# Patient Record
Sex: Female | Born: 1935 | Race: White | Hispanic: No | Smoking: Former smoker
Health system: Southern US, Community
[De-identification: ages and names within clinical notes are randomized; demographics above are authoritative.]

## PROBLEM LIST (undated history)

## (undated) DIAGNOSIS — T7840XA Allergy, unspecified, initial encounter: Secondary | ICD-10-CM

## (undated) DIAGNOSIS — K219 Gastro-esophageal reflux disease without esophagitis: Secondary | ICD-10-CM

## (undated) DIAGNOSIS — C801 Malignant (primary) neoplasm, unspecified: Secondary | ICD-10-CM

## (undated) DIAGNOSIS — E7800 Pure hypercholesterolemia, unspecified: Secondary | ICD-10-CM

## (undated) DIAGNOSIS — G43909 Migraine, unspecified, not intractable, without status migrainosus: Secondary | ICD-10-CM

## (undated) DIAGNOSIS — M549 Dorsalgia, unspecified: Secondary | ICD-10-CM

## (undated) DIAGNOSIS — J449 Chronic obstructive pulmonary disease, unspecified: Secondary | ICD-10-CM

## (undated) DIAGNOSIS — R634 Abnormal weight loss: Secondary | ICD-10-CM

## (undated) DIAGNOSIS — I10 Essential (primary) hypertension: Secondary | ICD-10-CM

## (undated) DIAGNOSIS — Z923 Personal history of irradiation: Secondary | ICD-10-CM

## (undated) DIAGNOSIS — K611 Rectal abscess: Secondary | ICD-10-CM

## (undated) HISTORY — PX: CYST REMOVAL HAND: SHX6279

## (undated) HISTORY — PX: ABDOMINAL HYSTERECTOMY: SHX81

---

## 2000-11-08 ENCOUNTER — Encounter: Payer: Self-pay | Admitting: Internal Medicine

## 2000-11-08 ENCOUNTER — Encounter: Admission: RE | Admit: 2000-11-08 | Discharge: 2000-11-08 | Payer: Self-pay | Admitting: Internal Medicine

## 2001-01-23 ENCOUNTER — Encounter (INDEPENDENT_AMBULATORY_CARE_PROVIDER_SITE_OTHER): Payer: Self-pay

## 2001-01-23 ENCOUNTER — Ambulatory Visit (HOSPITAL_COMMUNITY): Admission: RE | Admit: 2001-01-23 | Discharge: 2001-01-23 | Payer: Self-pay | Admitting: Gastroenterology

## 2001-01-23 HISTORY — PX: COLONOSCOPY W/ POLYPECTOMY: SHX1380

## 2001-02-18 ENCOUNTER — Encounter: Payer: Self-pay | Admitting: Gastroenterology

## 2001-02-18 ENCOUNTER — Ambulatory Visit (HOSPITAL_COMMUNITY): Admission: RE | Admit: 2001-02-18 | Discharge: 2001-02-18 | Payer: Self-pay | Admitting: Gastroenterology

## 2002-05-12 ENCOUNTER — Encounter: Admission: RE | Admit: 2002-05-12 | Discharge: 2002-05-12 | Payer: Self-pay | Admitting: Internal Medicine

## 2002-05-12 ENCOUNTER — Encounter: Payer: Self-pay | Admitting: Internal Medicine

## 2003-06-10 ENCOUNTER — Encounter: Admission: RE | Admit: 2003-06-10 | Discharge: 2003-06-10 | Payer: Self-pay | Admitting: Internal Medicine

## 2004-07-11 ENCOUNTER — Encounter: Admission: RE | Admit: 2004-07-11 | Discharge: 2004-07-11 | Payer: Self-pay | Admitting: Internal Medicine

## 2004-09-06 ENCOUNTER — Ambulatory Visit: Payer: Self-pay | Admitting: Internal Medicine

## 2005-10-19 ENCOUNTER — Emergency Department (HOSPITAL_COMMUNITY): Admission: EM | Admit: 2005-10-19 | Discharge: 2005-10-20 | Payer: Self-pay | Admitting: Emergency Medicine

## 2008-05-03 ENCOUNTER — Ambulatory Visit (HOSPITAL_COMMUNITY): Admission: RE | Admit: 2008-05-03 | Discharge: 2008-05-03 | Payer: Self-pay | Admitting: Pulmonary Disease

## 2010-08-04 NOTE — Procedures (Signed)
[place]  Patient:    Sara Cochran, Sara Cochran Visit Number: [id number] MRN: [id number]          Service Type: END Location: ENDO Attending Physician:  Charna Elizabeth Dictated by:   Anselmo Rod, M.D. Proc. Date: 01/23/01 Admit Date:  01/23/2001   CC:         Juluis Mire, M.D.   Procedure Report  DATE OF BIRTH:  July 13, 2033  PROCEDURE PERFORMED:  Colonoscopy with hot biopsies x 4.  ENDOSCOPIST:  Anselmo Rod, M.D.  INSTRUMENT USED:  Olympus video colonoscope (pediatric).  INDICATIONS FOR PROCEDURE:  Screening colonoscopy being performed on this 77 year old white female, rule out colonic polyps, masses, hemorrhoids, etc.  PREPROCEDURE PREPARATION:  Informed consent was obtained from the patient. The patient was fasted for 8 hours prior to the procedure, and prepped with a bottle of magnesium citrate and a gallon of NULYTELY the night prior to the procedure.  PREPROCEDURE PHYSICAL EXAMINATION:  VITAL SIGNS:  Stable.  NECK:  Supple.  CHEST:  Clear to auscultation.  S1 and S2 regular.  ABDOMEN:  Soft, normal bowel sounds.  DESCRIPTION OF PROCEDURE:  The patient was placed in the left lateral decubitus position, and sedated with 100 mg of Demerol and 10 mg of Versed intravenously.  Once the patient was adequately sedated and maintained on low flow oxygen and continuous cardiac monitoring, the Olympus video colonoscope was advanced from the rectum to the hepatic flexure with extreme difficulty, as the patient had a very tortuous colon.  The colon seemed to be long and redundant.  There were multiple small polyps removed from the rectosigmoid area (4).  There was a few early scattered diverticuli throughout the colon. No large masses or polyps were seen.  IMPRESSION: 1. A limited colonoscopy up to the hepatic flexure, scope would not be    advanced into the right colon. 2. Multiple small polyps biopsied from rectosigmoid area and  rectum. 3. A few early scattered diverticuli. 4. Prominent internal hemorrhoids on retroflexion.  RECOMMENDATIONS: 1. A contrast barium enema will be done in the next 3 weeks. 2. Await pathology results. 3. Outpatient followup in the next month. Dictated by:   Anselmo Rod, M.D. Attending Physician:  Charna Elizabeth DD:  01/23/01 TD:  01/24/01 Job: [id number]

## 2011-06-05 DIAGNOSIS — E785 Hyperlipidemia, unspecified: Secondary | ICD-10-CM | POA: Diagnosis not present

## 2011-06-05 DIAGNOSIS — I10 Essential (primary) hypertension: Secondary | ICD-10-CM | POA: Diagnosis not present

## 2011-06-05 DIAGNOSIS — M545 Low back pain: Secondary | ICD-10-CM | POA: Diagnosis not present

## 2011-06-05 DIAGNOSIS — J449 Chronic obstructive pulmonary disease, unspecified: Secondary | ICD-10-CM | POA: Diagnosis not present

## 2011-09-18 DIAGNOSIS — M545 Low back pain: Secondary | ICD-10-CM | POA: Diagnosis not present

## 2011-09-18 DIAGNOSIS — F411 Generalized anxiety disorder: Secondary | ICD-10-CM | POA: Diagnosis not present

## 2011-09-18 DIAGNOSIS — J449 Chronic obstructive pulmonary disease, unspecified: Secondary | ICD-10-CM | POA: Diagnosis not present

## 2011-09-18 DIAGNOSIS — I10 Essential (primary) hypertension: Secondary | ICD-10-CM | POA: Diagnosis not present

## 2011-10-15 ENCOUNTER — Emergency Department (INDEPENDENT_AMBULATORY_CARE_PROVIDER_SITE_OTHER)
Admission: EM | Admit: 2011-10-15 | Discharge: 2011-10-15 | Disposition: A | Discharge status: Home / Self Care | Payer: Medicare Other | Source: Home / Self Care

## 2011-10-15 ENCOUNTER — Encounter (HOSPITAL_COMMUNITY): Payer: Self-pay | Admitting: *Deleted

## 2011-10-15 DIAGNOSIS — R300 Dysuria: Secondary | ICD-10-CM | POA: Diagnosis not present

## 2011-10-15 DIAGNOSIS — N3000 Acute cystitis without hematuria: Secondary | ICD-10-CM

## 2011-10-15 DIAGNOSIS — R350 Frequency of micturition: Secondary | ICD-10-CM | POA: Diagnosis not present

## 2011-10-15 HISTORY — DX: Pure hypercholesterolemia, unspecified: E78.00

## 2011-10-15 HISTORY — DX: Chronic obstructive pulmonary disease, unspecified: J44.9

## 2011-10-15 HISTORY — DX: Migraine, unspecified, not intractable, without status migrainosus: G43.909

## 2011-10-15 HISTORY — DX: Dorsalgia, unspecified: M54.9

## 2011-10-15 HISTORY — DX: Gastro-esophageal reflux disease without esophagitis: K21.9

## 2011-10-15 LAB — POCT URINALYSIS DIP (DEVICE)
Glucose, UA: 100 mg/dL — AB
Ketones, ur: NEGATIVE mg/dL
Specific Gravity, Urine: 1.01 (ref 1.005–1.030)

## 2011-10-15 MED ORDER — CIPROFLOXACIN HCL 500 MG PO TABS: 250.0000 mg | ORAL_TABLET | Freq: Two times a day (BID) | ORAL | Status: AC

## 2011-10-15 NOTE — ED Notes (Signed)
Pt with onset of urinary frequency/urgency and burning Saturday taking otc azo -

## 2011-10-15 NOTE — ED Provider Notes (Signed)
History     CSN: [id number]  Arrival date & time 10/15/11  1107   None     Chief Complaint  Patient presents with  . Urinary Frequency  . Dysuria    (Consider location/radiation/quality/duration/timing/severity/associated sxs/prior treatment) The history is provided by the patient.    Sara Cochran is a 78 y.o. female who complains of urinary frequency, urgency and dysuria x 2 days, with flank pain, fever, chills, or abnormal vaginal discharge or bleeding. Has been taking AZO for symptoms with no relief.  Has history of UTI, not frequent, last UTI 2 years ago.  Denies known kidney disease or structural abnormalities.  Past Medical History  Diagnosis Date  . COPD (chronic obstructive pulmonary disease)   . High cholesterol   . Acid reflux   . Back pain   . Migraine     Past Surgical History  Procedure Date  . Cesarean section   . Abdominal hysterectomy     No family history on file.  History  Substance Use Topics  . Smoking status: Former Smoker    Quit date: 05/16/1996  . Smokeless tobacco: Not on file  . Alcohol Use: No    OB History    Grav Para Term Preterm Abortions TAB SAB Ect Mult Living                  Review of Systems  Constitutional: Negative.   Gastrointestinal: Negative.   Genitourinary: Positive for dysuria, urgency, frequency and flank pain. Negative for hematuria, decreased urine volume, vaginal bleeding, vaginal discharge, difficulty urinating and pelvic pain.    Allergies  Aspirin and Erythromycin  Home Medications   Current Outpatient Rx  Name Route Sig Dispense Refill  . ALBUTEROL SULFATE HFA 108 (90 BASE) MCG/ACT IN AERS Inhalation Inhale 2 puffs into the lungs every 6 (six) hours as needed.    . ALPRAZOLAM 1 MG PO TABS Oral Take 1 mg by mouth at bedtime as needed.    Marland Kitchen HYDROCODONE-ACETAMINOPHEN 10-650 MG PO TABS Oral Take 1 tablet by mouth every 6 (six) hours as needed.    . IPRATROPIUM-ALBUTEROL 0.5-2.5 (3) MG/3ML IN SOLN  Nebulization Take 3 mLs by nebulization.    Marland Kitchen OMEPRAZOLE 20 MG PO CPDR Oral Take 20 mg by mouth daily.    . AZO TABS PO Oral Take by mouth.    Marland Kitchen PRAVASTATIN SODIUM 40 MG PO TABS Oral Take 40 mg by mouth daily.    Marland Kitchen CIPROFLOXACIN HCL 500 MG PO TABS Oral Take 0.5 tablets (250 mg total) by mouth every 12 (twelve) hours. 10 tablet 0    BP 184/75  Pulse 98  Temp 98.1 F (36.7 C) (Oral)  Resp 20  SpO2 92%  Physical Exam  Nursing note and vitals reviewed. Constitutional: She is oriented to person, place, and time. Vital signs are normal. She appears well-developed and well-nourished. She is active and cooperative.  HENT:  Head: Normocephalic.  Eyes: Conjunctivae are normal. Pupils are equal, round, and reactive to light. No scleral icterus.  Neck: Trachea normal. Neck supple.  Cardiovascular: Normal rate, regular rhythm and normal heart sounds.   Pulmonary/Chest: Effort normal and breath sounds normal.  Abdominal: Soft. Normal appearance and bowel sounds are normal. There is no hepatosplenomegaly. There is no tenderness. There is no CVA tenderness.  Lymphadenopathy:       Right: No inguinal adenopathy present.       Left: No inguinal adenopathy present.  Neurological: She is alert and oriented to  person, place, and time. No cranial nerve deficit or sensory deficit. GCS eye subscore is 4. GCS verbal subscore is 5. GCS motor subscore is 6.  Skin: Skin is warm and dry.  Psychiatric: She has a normal mood and affect. Her speech is normal and behavior is normal. Judgment and thought content normal. Cognition and memory are normal.    ED Course  Procedures (including critical care time)  Labs Reviewed  POCT URINALYSIS DIP (DEVICE) - Abnormal; Notable for the following:    Glucose, UA 100 (*)     Bilirubin Urine SMALL (*)     Hgb urine dipstick MODERATE (*)     Protein, ur 30 (*)     Nitrite POSITIVE (*)     Leukocytes, UA TRACE (*)  Biochemical Testing Only. Please order routine  urinalysis from main lab if confirmatory testing is needed.   All other components within normal limits  URINE CULTURE   No results found.   1. Acute cystitis   2. Urinary frequency   3. Dysuria       MDM  Increase fluid intake, take antibiotics as prescribed, await results of urine culture, follow up with primary care provider if symptoms do not improve.        Johnsie Kindred, NP 10/15/11 1215

## 2011-10-16 NOTE — ED Provider Notes (Signed)
Medical screening examination/treatment/procedure(s) were performed by non-physician practitioner and as supervising physician I was immediately available for consultation/collaboration.   [facility]; MD   Sharin Grave, MD 10/16/11 [phone]

## 2011-10-17 LAB — URINE CULTURE

## 2011-10-18 NOTE — ED Notes (Signed)
Urine culture: 15,000 colonies E. Coli.  Pt. adequately treated with Cipro. Vassie Moselle 10/18/2011

## 2011-10-22 DIAGNOSIS — I10 Essential (primary) hypertension: Secondary | ICD-10-CM | POA: Diagnosis not present

## 2011-10-29 DIAGNOSIS — I10 Essential (primary) hypertension: Secondary | ICD-10-CM | POA: Diagnosis not present

## 2011-11-12 DIAGNOSIS — I10 Essential (primary) hypertension: Secondary | ICD-10-CM | POA: Diagnosis not present

## 2011-11-26 DIAGNOSIS — I10 Essential (primary) hypertension: Secondary | ICD-10-CM | POA: Diagnosis not present

## 2011-12-13 DIAGNOSIS — Z23 Encounter for immunization: Secondary | ICD-10-CM | POA: Diagnosis not present

## 2011-12-17 DIAGNOSIS — I10 Essential (primary) hypertension: Secondary | ICD-10-CM | POA: Diagnosis not present

## 2011-12-17 DIAGNOSIS — M545 Low back pain: Secondary | ICD-10-CM | POA: Diagnosis not present

## 2011-12-20 DIAGNOSIS — E785 Hyperlipidemia, unspecified: Secondary | ICD-10-CM | POA: Diagnosis not present

## 2011-12-20 DIAGNOSIS — I10 Essential (primary) hypertension: Secondary | ICD-10-CM | POA: Diagnosis not present

## 2011-12-20 DIAGNOSIS — J449 Chronic obstructive pulmonary disease, unspecified: Secondary | ICD-10-CM | POA: Diagnosis not present

## 2011-12-20 DIAGNOSIS — Z23 Encounter for immunization: Secondary | ICD-10-CM | POA: Diagnosis not present

## 2011-12-20 DIAGNOSIS — M545 Low back pain: Secondary | ICD-10-CM | POA: Diagnosis not present

## 2012-01-28 DIAGNOSIS — J449 Chronic obstructive pulmonary disease, unspecified: Secondary | ICD-10-CM | POA: Diagnosis not present

## 2012-01-28 DIAGNOSIS — J440 Chronic obstructive pulmonary disease with acute lower respiratory infection: Secondary | ICD-10-CM | POA: Diagnosis not present

## 2012-03-26 DIAGNOSIS — M545 Low back pain: Secondary | ICD-10-CM | POA: Diagnosis not present

## 2012-03-26 DIAGNOSIS — E785 Hyperlipidemia, unspecified: Secondary | ICD-10-CM | POA: Diagnosis not present

## 2012-03-26 DIAGNOSIS — I10 Essential (primary) hypertension: Secondary | ICD-10-CM | POA: Diagnosis not present

## 2012-03-26 DIAGNOSIS — J449 Chronic obstructive pulmonary disease, unspecified: Secondary | ICD-10-CM | POA: Diagnosis not present

## 2012-05-07 DIAGNOSIS — I10 Essential (primary) hypertension: Secondary | ICD-10-CM | POA: Diagnosis not present

## 2012-05-07 DIAGNOSIS — E785 Hyperlipidemia, unspecified: Secondary | ICD-10-CM | POA: Diagnosis not present

## 2012-05-07 DIAGNOSIS — N390 Urinary tract infection, site not specified: Secondary | ICD-10-CM | POA: Diagnosis not present

## 2012-07-03 DIAGNOSIS — J449 Chronic obstructive pulmonary disease, unspecified: Secondary | ICD-10-CM | POA: Diagnosis not present

## 2012-07-03 DIAGNOSIS — N390 Urinary tract infection, site not specified: Secondary | ICD-10-CM | POA: Diagnosis not present

## 2012-07-03 DIAGNOSIS — I10 Essential (primary) hypertension: Secondary | ICD-10-CM | POA: Diagnosis not present

## 2012-07-03 DIAGNOSIS — M545 Low back pain: Secondary | ICD-10-CM | POA: Diagnosis not present

## 2012-08-14 DIAGNOSIS — I10 Essential (primary) hypertension: Secondary | ICD-10-CM | POA: Diagnosis not present

## 2012-08-14 DIAGNOSIS — E785 Hyperlipidemia, unspecified: Secondary | ICD-10-CM | POA: Diagnosis not present

## 2012-08-14 DIAGNOSIS — M545 Low back pain: Secondary | ICD-10-CM | POA: Diagnosis not present

## 2012-08-25 DIAGNOSIS — L570 Actinic keratosis: Secondary | ICD-10-CM | POA: Diagnosis not present

## 2012-08-25 DIAGNOSIS — D0460 Carcinoma in situ of skin of unspecified upper limb, including shoulder: Secondary | ICD-10-CM | POA: Diagnosis not present

## 2012-08-25 DIAGNOSIS — D235 Other benign neoplasm of skin of trunk: Secondary | ICD-10-CM | POA: Diagnosis not present

## 2012-10-23 DIAGNOSIS — K219 Gastro-esophageal reflux disease without esophagitis: Secondary | ICD-10-CM | POA: Diagnosis not present

## 2012-10-23 DIAGNOSIS — R141 Gas pain: Secondary | ICD-10-CM | POA: Diagnosis not present

## 2012-10-23 DIAGNOSIS — K589 Irritable bowel syndrome without diarrhea: Secondary | ICD-10-CM | POA: Diagnosis not present

## 2012-10-23 DIAGNOSIS — K602 Anal fissure, unspecified: Secondary | ICD-10-CM | POA: Diagnosis not present

## 2012-10-23 DIAGNOSIS — R634 Abnormal weight loss: Secondary | ICD-10-CM | POA: Diagnosis not present

## 2012-10-23 DIAGNOSIS — R142 Eructation: Secondary | ICD-10-CM | POA: Diagnosis not present

## 2012-11-11 DIAGNOSIS — K589 Irritable bowel syndrome without diarrhea: Secondary | ICD-10-CM | POA: Diagnosis not present

## 2012-11-11 DIAGNOSIS — K625 Hemorrhage of anus and rectum: Secondary | ICD-10-CM | POA: Diagnosis not present

## 2012-11-11 DIAGNOSIS — K602 Anal fissure, unspecified: Secondary | ICD-10-CM | POA: Diagnosis not present

## 2012-11-11 DIAGNOSIS — K6289 Other specified diseases of anus and rectum: Secondary | ICD-10-CM | POA: Diagnosis not present

## 2012-11-13 DIAGNOSIS — Z23 Encounter for immunization: Secondary | ICD-10-CM | POA: Diagnosis not present

## 2012-11-13 DIAGNOSIS — M545 Low back pain: Secondary | ICD-10-CM | POA: Diagnosis not present

## 2012-11-13 DIAGNOSIS — J449 Chronic obstructive pulmonary disease, unspecified: Secondary | ICD-10-CM | POA: Diagnosis not present

## 2012-11-13 DIAGNOSIS — I10 Essential (primary) hypertension: Secondary | ICD-10-CM | POA: Diagnosis not present

## 2012-11-13 DIAGNOSIS — F411 Generalized anxiety disorder: Secondary | ICD-10-CM | POA: Diagnosis not present

## 2012-12-18 DIAGNOSIS — R141 Gas pain: Secondary | ICD-10-CM | POA: Diagnosis not present

## 2012-12-18 DIAGNOSIS — K648 Other hemorrhoids: Secondary | ICD-10-CM | POA: Diagnosis not present

## 2012-12-18 DIAGNOSIS — K589 Irritable bowel syndrome without diarrhea: Secondary | ICD-10-CM | POA: Diagnosis not present

## 2012-12-18 DIAGNOSIS — K5730 Diverticulosis of large intestine without perforation or abscess without bleeding: Secondary | ICD-10-CM | POA: Diagnosis not present

## 2013-01-13 DIAGNOSIS — E785 Hyperlipidemia, unspecified: Secondary | ICD-10-CM | POA: Diagnosis not present

## 2013-01-13 DIAGNOSIS — J449 Chronic obstructive pulmonary disease, unspecified: Secondary | ICD-10-CM | POA: Diagnosis not present

## 2013-01-13 DIAGNOSIS — I10 Essential (primary) hypertension: Secondary | ICD-10-CM | POA: Diagnosis not present

## 2013-01-13 DIAGNOSIS — K589 Irritable bowel syndrome without diarrhea: Secondary | ICD-10-CM | POA: Diagnosis not present

## 2013-01-21 ENCOUNTER — Other Ambulatory Visit: Payer: Self-pay | Admitting: Gastroenterology

## 2013-01-21 DIAGNOSIS — R198 Other specified symptoms and signs involving the digestive system and abdomen: Secondary | ICD-10-CM | POA: Diagnosis not present

## 2013-01-21 DIAGNOSIS — K6289 Other specified diseases of anus and rectum: Secondary | ICD-10-CM

## 2013-01-21 DIAGNOSIS — Z1211 Encounter for screening for malignant neoplasm of colon: Secondary | ICD-10-CM | POA: Diagnosis not present

## 2013-01-21 DIAGNOSIS — C20 Malignant neoplasm of rectum: Secondary | ICD-10-CM | POA: Diagnosis not present

## 2013-01-21 DIAGNOSIS — K625 Hemorrhage of anus and rectum: Secondary | ICD-10-CM | POA: Diagnosis not present

## 2013-01-21 HISTORY — PX: RECTAL BIOPSY: SHX2303

## 2013-01-22 ENCOUNTER — Ambulatory Visit
Admission: RE | Admit: 2013-01-22 | Discharge: 2013-01-22 | Disposition: A | Discharge status: Home / Self Care | Payer: Medicare Other | Source: Ambulatory Visit | Attending: Gastroenterology | Admitting: Gastroenterology

## 2013-01-22 DIAGNOSIS — R198 Other specified symptoms and signs involving the digestive system and abdomen: Secondary | ICD-10-CM | POA: Diagnosis not present

## 2013-01-22 DIAGNOSIS — K625 Hemorrhage of anus and rectum: Secondary | ICD-10-CM | POA: Diagnosis not present

## 2013-01-22 DIAGNOSIS — K6289 Other specified diseases of anus and rectum: Secondary | ICD-10-CM

## 2013-01-22 DIAGNOSIS — C20 Malignant neoplasm of rectum: Secondary | ICD-10-CM | POA: Diagnosis not present

## 2013-01-22 MED ORDER — IOHEXOL 300 MG/ML  SOLN
30.0000 mL | Freq: Once | INTRAMUSCULAR | Status: AC | PRN
  Administered 2013-01-22: 30 mL via ORAL

## 2013-01-22 MED ORDER — IOHEXOL 300 MG/ML  SOLN
100.0000 mL | Freq: Once | INTRAMUSCULAR | Status: AC | PRN
  Administered 2013-01-22: 100 mL via INTRAVENOUS

## 2013-01-22 MED ORDER — IOHEXOL 300 MG/ML  SOLN: 30.0000 mL | Freq: Once | INTRAMUSCULAR | Status: DC | PRN

## 2013-01-26 ENCOUNTER — Encounter (INDEPENDENT_AMBULATORY_CARE_PROVIDER_SITE_OTHER): Payer: Self-pay | Admitting: General Surgery

## 2013-01-26 ENCOUNTER — Ambulatory Visit (INDEPENDENT_AMBULATORY_CARE_PROVIDER_SITE_OTHER): Payer: Medicare Other | Admitting: General Surgery

## 2013-01-26 VITALS — BP 160/82 | HR 80 | Temp 99.2°F | Resp 15 | Ht 62.0 in | Wt 106.8 lb

## 2013-01-26 DIAGNOSIS — C210 Malignant neoplasm of anus, unspecified: Secondary | ICD-10-CM

## 2013-01-26 NOTE — Progress Notes (Signed)
Chief Complaint  Patient presents with  . New Evaluation    eval rectal mass with perirectal absces    HISTORY: Sara Cochran is a 79 y.o. female who presents to the office with distal rectal mass.  This was found on colonoscopy.  Workup thus far has included an attempted colonoscopy but this was unable to be performed due to an obstructing distal rectal mass.  The patient states she has been having anal pain and pressure symptoms for about a year and has been treated for a fissure.  She has also had diarrhea and a 25 lb weight loss.  This was mainly attributed to the loss of her husband about a year ago.  She does not have any problems moving her bowels and the pressure sensation got better after the colonoscopy.  She reports some intermittent purulent drainage over the past few days as well.  She denies any fevers.     Past Medical History  Diagnosis Date  . COPD (chronic obstructive pulmonary disease)   . High cholesterol   . Acid reflux   . Back pain   . Migraine       Past Surgical History  Procedure Laterality Date  . Cesarean section    . Abdominal hysterectomy    . Cyst removal hand      3 removed from right hand      Current Outpatient Prescriptions  Medication Sig Dispense Refill  . albuterol (PROVENTIL HFA;VENTOLIN HFA) 108 (90 BASE) MCG/ACT inhaler Inhale 2 puffs into the lungs every 6 (six) hours as needed.      . ALPRAZolam (XANAX) 1 MG tablet Take 1 mg by mouth at bedtime as needed.      Marland Kitchen amLODipine (NORVASC) 5 MG tablet       . estradiol (ESTRACE) 2 MG tablet       . HYDROcodone-acetaminophen (LORCET) 10-650 MG per tablet Take 1 tablet by mouth every 6 (six) hours as needed.      Marland Kitchen ipratropium-albuterol (DUONEB) 0.5-2.5 (3) MG/3ML SOLN Take 3 mLs by nebulization.      . lidocaine (XYLOCAINE) 5 % ointment Apply 1 application topically as needed.      Marland Kitchen olmesartan (BENICAR) 40 MG tablet Take 40 mg by mouth daily.      Marland Kitchen omeprazole (PRILOSEC) 20 MG capsule Take 20 mg  by mouth daily.      . Phenazopyridine HCl (AZO TABS PO) Take by mouth.      . pravastatin (PRAVACHOL) 40 MG tablet Take 40 mg by mouth daily.       No current facility-administered medications for this visit.      Allergies  Allergen Reactions  . Aspirin Shortness Of Breath and Swelling  . Erythromycin Hives and Rash  . Niacin And Related Other (See Comments)    Redness all over       Family History  Problem Relation Age of Onset  . Heart disease Mother   . Stroke Mother   . Cancer Sister     brain      History   Social History  . Marital Status: Widowed    Spouse Name: N/A    Number of Children: N/A  . Years of Education: N/A   Social History Main Topics  . Smoking status: Former Smoker    Quit date: 05/16/1996  . Smokeless tobacco: Never Used  . Alcohol Use: No  . Drug Use: No  . Sexual Activity: None   Other Topics Concern  .  None   Social History Narrative  . None       REVIEW OF SYSTEMS - PERTINENT POSITIVES ONLY: Review of Systems - General ROS: positive for  - weight loss negative for - chills or fever Respiratory ROS: no cough, shortness of breath, or wheezing Cardiovascular ROS: no chest pain or dyspnea on exertion Gastrointestinal ROS: positive for - change in stools and diarrhea negative for - change in bowel habits or nausea/vomiting Genito-Urinary ROS: no dysuria, trouble voiding, or hematuria  EXAM: Filed Vitals:   01/26/13 1348  BP: 160/82  Pulse: 80  Temp: 99.2 F (37.3 C)  Resp: 15    Gen:  No acute distress.  Well nourished and well groomed.   Neurological: Alert and oriented to person, place, and time. Coordination normal.  Head: Normocephalic and atraumatic.  Eyes: Conjunctivae are normal. Pupils are equal, round, and reactive to light. No scleral icterus.  Neck: Normal range of motion. Neck supple. No tracheal deviation or thyromegaly present.  No cervical lymphadenopathy. Cardiovascular: Normal rate, regular rhythm, normal  heart sounds and intact distal pulses.  Exam reveals no gallop and no friction rub.  No murmur heard. Respiratory: Effort normal.  No respiratory distress. No chest wall tenderness. Breath sounds normal.  No wheezes, rales or rhonchi.  GI: Soft. Bowel sounds are normal. The abdomen is soft and nontender.  There is no rebound and no guarding.  Musculoskeletal: Normal range of motion. Extremities are nontender.  Skin: Skin is warm and dry. No rash noted. No diaphoresis. No erythema. No pallor. No clubbing, cyanosis, or edema.   Psychiatric: Normal mood and affect. Behavior is normal. Judgment and thought content normal.   Anorectal Exam: distal rectal mass palpated along right sidewall, ~4cm from anal verge   LABORATORY RESULTS: Available labs are reviewed    RADIOLOGY RESULTS:   Images and reports are reviewed. CT ABD IMPRESSION: Rectal wall thickening, possibly reflecting proctitis. Associated 3.2 x 2.6 cm left perirectal gas and fluid collection.  Although not visualized, fistula communication between the collection and the rectal lumen is suspected. Three tiny hypoenhancing liver lesions which likely reflects cysts.   ASSESSMENT AND PLAN:  Sara Cochran is a 79 y.o. 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  Her CT shows a perirectal abscess, but it sounds like this is draining, given her description of occasional purulent drainage.  I will set her up for a CT chest to complete her metastatic work up.  Given her CT findings, I think she would benefit from a PET to evaluate for distant disease.  I will refer her to oncology and radiation oncology for treatment.  We discussed the nature of this disease and all questions were answered.  She may need operative drainage of her abscess in the future, if her symptoms return.     Vanita Panda, MD Colon and Rectal Surgery / General Surgery [facility]      Visit Diagnoses: 1. Anal  cancer     Primary Care Physician: Fredirick Maudlin, MD

## 2013-01-26 NOTE — Patient Instructions (Signed)
Anal Cancer  What is anal cancer? Cancer describes a set of diseases in which normal cells in the body, through a series of genetic changes, become abnormal and lose the ability to control their growth. As cancers - also known as "malignancies" - grow, they invade the tissues around them (local invasion). They may also spread to other locations in the body via the blood vessels or lymphatic channels where they may implant and grow (metastases). The anus or anal canal is the passage that connects the rectum, or last part of the large intestine, to the outside of the body. Anal cancer arises from the cells around the anal opening or in the anal canal just inside the anal opening. Anal cancer is often a type of cancer called "squamous cell carcinoma". Other rare types of cancer may also occur in the anal canal and these require consultation with your physician or surgeon to determine the appropriate evaluation and treatment.  Cells that are becoming malignant or "premalignant", but have not invaded deeper into the skin, are referred to as "high-grade anal intraepithelial neoplasia" or HGAIN (previously referred to by a number of different terms, including "high grade dysplasia", "carcinoma-in-situ", "anal intra-epithelial neoplasia grade III", "high-grade squamous intraepithelial lesion", or "Bowen's disease"). While this condition is likely a precursor to anal cancer, this is not anal cancer and is treated differently than anal cancer. Your physician or colon and rectal surgeon can help clarify the differences. How common is anal cancer? Anal cancer is fairly uncommon, and accounts for about 1-2% of cancers affecting the intestinal tract. Approximately one in 600 men and women will get anal cancer in their lifetime (this can be compared to 1 in 20 men and women who will developed colon and rectal cancer in their lifetime). Almost 6,000 new cases of anal cancer are now diagnosed each year in the [place] with about  2/3rds of the cases in women. Approximately 800 people will die of the disease each year. Unlike some cancers, the numbers of patients that develop anal cancer each year is slowly increasing, especially in some higher risk groups (see below).  Who is at risk? A risk factor is something that increases a person's chance of getting a disease. Anal cancer is commonly associated with infection with the human papilloma virus (HPV), but some anal cancers develop without this infection being present. HPV can also cause warts in and around the anus as well as genital warts (on the penis in men and the vagina or cervix in women), but warts do not have to be present for anal cancer to develop. HPV is also associated with an increased risk of cervical and vaginal or vulvar cancer in women, penile cancer in men, as well as with some head and neck cancers in men and women. Having some of these cancers, especially cervical or vulvar cancer (or even pre-cancerous change in the cervix or vulva), can put people at increased risk for anal cancer - likely from the association with HPV infection.  Additional risk factors for anal cancer include: Age - While most of the cases of anal cancer develop in people over age 55, 1/3rd of the cases occur in patients that are younger than that.  Anal sex - People participating in anal sex, both men and women, are at increased risk.  Sexually transmitted diseases - Patients with multiple sex partners are at higher risk of getting sexually transmitted diseases like HPV and HIV and are, therefore, at an increased risk of developing anal   cancer.  Smoking - Harmful chemicals from smoking increases the risk of most cancers, including anal cancer.  Immunosuppression - People with weakened immune systems, such as transplant patients who must take drugs to suppress their immune systems and patients with HIV infection, are at higher risk.  Chronic local inflammation - People with long-standing anal  fistulas or open wounds in the anal area are at a slightly higher risk.  Pelvic radiation - People who have had pelvic radiation therapy for rectal, prostate, bladder or cervical cancer are at increased risk. Can anal cancer be prevented? Few cancers can be totally prevented, but the risk of developing anal cancer may be decreased significantly by avoiding the risk factors listed above and by getting regular checkups. Avoiding anal sex and infection with HPV and HIV can reduce the risk of developing anal cancer. Using condoms whenever having any kind of intercourse may reduce, but not eliminate, the risk of HPV infection. Smoking cessation lowers the risk of many types of cancer, including anal cancer. Vaccines against infection with certain types of HPV, especially in high-risk patients (see risk factors listed above), may also decrease the risk of developing anal cancer (in men and women). People who are at increased risk for anal cancer based on the risk factors listed above should talk to their doctors about consideration for anal cancer screening. This can include anal cytology or Pap tests (much like the Pap tests women undergo for cervical cancer screening). Early identification and treatment of premalignant lesions in the anus may also prevent the development of anal cancer.  What are the symptoms of anal cancer? Although 20% of anal cancers may be asymptomatic, many cases of anal cancer can be found early because they form in a part of the digestive tract that a doctor can see and reach easily. Anal cancers may cause symptoms such as:  Bleeding from the rectum or anus  The feeling of a lump or mass at the anal opening  Persistent or recurring pain in the anal area  Persistent or recurrent itching  Change in bowel habits (having more or fewer bowel movements) or increased straining during a bowel movement  Narrowing of the stools  Discharge or drainage (mucous or pus) from the anus  Swollen  lymph nodes (glands) in the anal or groin areas These symptoms can also be caused by less serious conditions such as hemorrhoids, but you should never assume this. If you have any of these symptoms, see your doctor or colon and rectal surgeon. How is anal cancer diagnosed? Anal cancer is usually found on examination of the anal canal because of the presence of symptoms listed above, on routine yearly physical exams by a physician (rectal exam for prostate check or at the time of a pelvic exam), or on screening tests such as those recommended for preventing or diagnosing colorectal cancer (for example: colonoscopy or lighted scope exam of the colon and rectum or yearly stool blood tests). Anoscopy, or examination of the anal canal with a small, lighted scope, may be performed as well to assess any abnormal findings. If an abnormal area in the anal canal is identified based on the doctor's exam, a biopsy will be performed to determine the diagnosis. If the diagnosis of anal cancer is confirmed, additional tests to determine the extent of the cancer may be recommended, which may include ultrasounds, Xrays, CT scans, and/or PET scans.  How are anal cancers treated? Treatment for most cases of anal cancer is very effective in   curing the cancer. There are 3 basic types of treatment used for anal cancer:  Surgery - an operation to remove the cancer  Radiation therapy - high-dose x-rays to kill cancer cells  Chemotherapy - giving drugs to kill cancer cells Combination therapy including radiation therapy and chemotherapy is now considered the standard treatment for most anal cancers. Occasionally, a very small or early tumor may be removed surgically (local excision) without the need for further treatment and with minimal damage to the anal sphincter muscles that are important for bowel control. On occasion, more major surgery to remove the anal cancer is needed, and this requires the creation of a colostomy where  the bowel is brought out to the skin on the belly wall where a bag is attached to collect the fecal matter. Will I need a colostomy? The majority of patients treated for anal cancer will not need a colostomy. If the tumor does not respond completely to combination therapy, if it recurs after treatment, or if it is an unusual type of anal cancer, removal of the rectum and anus and creation of a permanent colostomy may be necessary. This operation is known as an abdominoperineal resection (APR). What happens after treatment for anal cancer? Follow-up care to assess the results of treatment and to check for recurrence is very important. Most anal cancers are cured with combination therapy and/or surgery, so you should report any symptoms or problems to your doctor or surgeon right away. In addition, many tumors that recur may be successfully treated with surgery if they are caught early. A careful examination by an experienced physician or colon and rectal surgeon at regular intervals is the most important method of follow-up. Additional studies, such as certain types of scans (for example, CT or MRI) or ultrasounds, may also be recommended.  Conclusion Anal cancers are unusual tumors arising from the skin or lining of the anal canal. As with most cancers, early detection is associated with excellent survival. Most tumors are well-treated with combination chemotherapy and radiation. Recurrences, treatment failures, and advanced disease may require surgery. Follow the recommended screening examinations for anal and colorectal cancer and consult your doctor or colon and rectal surgeon early when any concerning symptoms occur.  What is a colon and rectal surgeon? Colon and rectal surgeons are experts in the surgical and non-surgical treatment of diseases of the colon, rectum and anus. They have completed advanced surgical training in the treatment of these diseases as well as full general surgical training.  Board-certified colon and rectal surgeons complete residencies in general surgery and colon and rectal surgery, and pass intensive examinations conducted by the American Board of Surgery and the American Board of Colon and Rectal Surgery. They are well-versed in the treatment of both benign and malignant diseases of the colon, rectum and anus and are able to perform routine screening examinations and surgically treat conditions if indicated to do so. author: Paul E. Wise, MD, FACS, FASCRS, on behalf of the ASCRS Public Relations Committee   

## 2013-01-27 ENCOUNTER — Encounter (INDEPENDENT_AMBULATORY_CARE_PROVIDER_SITE_OTHER): Payer: Self-pay

## 2013-01-27 ENCOUNTER — Telehealth: Payer: Self-pay | Admitting: *Deleted

## 2013-01-27 ENCOUNTER — Telehealth (INDEPENDENT_AMBULATORY_CARE_PROVIDER_SITE_OTHER): Payer: Self-pay | Admitting: *Deleted

## 2013-01-27 NOTE — Telephone Encounter (Signed)
LMOM for pt to return my call.  I was calling to inform her of her appt for the CT and PET scan at [facility] on 02/05/13 with an arrival time of 11:45am.  Pt is to have water ONLY after 6am.

## 2013-01-27 NOTE — Telephone Encounter (Signed)
Spoke with patient re: appointment with Dr. Truett Perna on 02/11/13.  Directions, names, and contact numbers were provided.  Explained role of nurse navigator.  She was without questions.

## 2013-01-28 ENCOUNTER — Encounter: Payer: Self-pay | Admitting: Radiation Oncology

## 2013-01-28 DIAGNOSIS — C801 Malignant (primary) neoplasm, unspecified: Secondary | ICD-10-CM | POA: Insufficient documentation

## 2013-01-28 NOTE — Progress Notes (Signed)
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 no  Possible current pregnancy? no  Is the patient on methotrexate? no  Current Complaints / other details:  Widowed, 2 living children 1  1 deceased son, , former smoker cigarettes, ,quit 1998, no illicit drug or alcohol use  C-Section, mother heart disease,stroke, sister brain cancer

## 2013-02-02 DIAGNOSIS — Z23 Encounter for immunization: Secondary | ICD-10-CM | POA: Diagnosis not present

## 2013-02-02 DIAGNOSIS — T6391XA Toxic effect of contact with unspecified venomous animal, accidental (unintentional), initial encounter: Secondary | ICD-10-CM | POA: Diagnosis not present

## 2013-02-03 ENCOUNTER — Other Ambulatory Visit (INDEPENDENT_AMBULATORY_CARE_PROVIDER_SITE_OTHER): Payer: Self-pay

## 2013-02-03 ENCOUNTER — Telehealth (INDEPENDENT_AMBULATORY_CARE_PROVIDER_SITE_OTHER): Payer: Self-pay | Admitting: *Deleted

## 2013-02-03 DIAGNOSIS — C210 Malignant neoplasm of anus, unspecified: Secondary | ICD-10-CM

## 2013-02-03 NOTE — Telephone Encounter (Signed)
I called pt to inform her that her lab work needs to be drawn prior to Thursday 11/20 for her PET scan.  I gave her the address of [facility] and she is going to see if someone can take her by there tomorrow.

## 2013-02-04 ENCOUNTER — Ambulatory Visit: Payer: Medicare Other

## 2013-02-04 ENCOUNTER — Ambulatory Visit: Payer: Medicare Other | Admitting: Radiation Oncology

## 2013-02-04 LAB — BUN+CREAT: BUN/Creatinine Ratio: 22.6 Ratio

## 2013-02-04 LAB — BUN: BUN: 14 mg/dL (ref 6–23)

## 2013-02-05 ENCOUNTER — Encounter (HOSPITAL_COMMUNITY)
Admission: RE | Admit: 2013-02-05 | Discharge: 2013-02-05 | Disposition: A | Discharge status: Home / Self Care | Payer: Medicare Other | Source: Ambulatory Visit | Attending: General Surgery | Admitting: General Surgery

## 2013-02-05 ENCOUNTER — Encounter (HOSPITAL_COMMUNITY): Payer: Self-pay

## 2013-02-05 ENCOUNTER — Ambulatory Visit
Admission: RE | Admit: 2013-02-05 | Discharge: 2013-02-05 | Disposition: A | Discharge status: Home / Self Care | Payer: Medicare Other | Source: Ambulatory Visit | Attending: Radiation Oncology | Admitting: Radiation Oncology

## 2013-02-05 ENCOUNTER — Encounter (INDEPENDENT_AMBULATORY_CARE_PROVIDER_SITE_OTHER): Payer: Self-pay

## 2013-02-05 ENCOUNTER — Encounter: Payer: Self-pay | Admitting: Radiation Oncology

## 2013-02-05 VITALS — BP 140/68 | HR 83 | Temp 97.8°F | Resp 20 | Ht 62.0 in | Wt 106.3 lb

## 2013-02-05 DIAGNOSIS — C210 Malignant neoplasm of anus, unspecified: Secondary | ICD-10-CM | POA: Diagnosis not present

## 2013-02-05 DIAGNOSIS — I2510 Atherosclerotic heart disease of native coronary artery without angina pectoris: Secondary | ICD-10-CM | POA: Diagnosis not present

## 2013-02-05 DIAGNOSIS — K449 Diaphragmatic hernia without obstruction or gangrene: Secondary | ICD-10-CM | POA: Diagnosis not present

## 2013-02-05 DIAGNOSIS — J438 Other emphysema: Secondary | ICD-10-CM | POA: Insufficient documentation

## 2013-02-05 HISTORY — DX: Malignant (primary) neoplasm, unspecified: C80.1

## 2013-02-05 HISTORY — DX: Allergy, unspecified, initial encounter: T78.40XA

## 2013-02-05 HISTORY — DX: Rectal abscess: K61.1

## 2013-02-05 HISTORY — DX: Essential (primary) hypertension: I10

## 2013-02-05 HISTORY — DX: Abnormal weight loss: R63.4

## 2013-02-05 LAB — GLUCOSE, CAPILLARY: Glucose-Capillary: 93 mg/dL (ref 70–99)

## 2013-02-05 MED ORDER — FLUDEOXYGLUCOSE F - 18 (FDG) INJECTION
19.8000 | Freq: Once | INTRAVENOUS | Status: AC | PRN
Start: 2013-02-05 — End: 2013-02-05
  Administered 2013-02-05: 19.8 via INTRAVENOUS

## 2013-02-05 MED ORDER — IOHEXOL 300 MG/ML  SOLN
80.0000 mL | Freq: Once | INTRAMUSCULAR | Status: AC | PRN
  Administered 2013-02-05: 80 mL via INTRAVENOUS

## 2013-02-05 NOTE — Progress Notes (Signed)
Please see the Nurse Progress Note in the MD Initial Consult Encounter for this patient. 

## 2013-02-05 NOTE — Progress Notes (Signed)
Radiation Oncology         [phone] ________________________________  Name: Sara Cochran MRN: [id number]  Date: 02/05/2013  DOB: 1934-02-20  WG:[id number] L, MD  Romie Levee, MD   G. Rolm Baptise, MD  REFERRING PHYSICIAN: Romie Levee, MD   DIAGNOSIS: The encounter diagnosis was Anal cancer.   HISTORY OF PRESENT ILLNESS::Sara Cochran is a 79 y.o. female who is seen for an initial consultation visit. The patient is seen today accompanied by multiple family members. She notes that she has had some rectal pain and fullness for some time. She does believe that the pain has somewhat increased over the last month. This pain has been much worse with bowel movements. She is also had some history of diarrhea and a 25 pound weight loss.  The patient underwent a colonoscopy on 01/21/2013. This revealed an infiltrative obstructing mass in the rectum at 5 cm. The mass was circumferential and friable and was bleeding. Multiple biopsies were obtained. The scope could not be advanced beyond the rectum. The biopsies returned positive for invasive squamous cell carcinoma, moderately to poorly differentiated.  The patient proceeded to undergo a CT scan of the abdomen and pelvis. Rectal wall thickening was present along the left lateral aspect. There was a 3.2 x 2.6 cm left perirectal fluid collection. A fistula between this collection and the rectal lumen was suspected although not visualized. The patient underwent further staging studies today including a PET scan and a CT scan of the chest.   PREVIOUS RADIATION THERAPY: No   PAST MEDICAL HISTORY:  has a past medical history of COPD (chronic obstructive pulmonary disease); High cholesterol; Acid reflux; Back pain; Migraine; Weight loss; Allergy; Perirectal abscess; Hypertension; and Cancer (01/21/13 bx).     PAST SURGICAL HISTORY: Past Surgical History  Procedure Laterality Date  . Cesarean section    . Abdominal hysterectomy    .  Cyst removal hand      3 removed from right hand  . Colonoscopy w/ polypectomy  01/23/2001    polypoid colonic mucosa,no adenomatous change or malignancy identified  . Rectal biopsy  01/21/2013    invasive squamous cell ca,mod to poorly differentiatiated     FAMILY HISTORY: family history includes Cancer in her sister; Heart disease in her mother; Stroke in her mother.   SOCIAL HISTORY:  reports that she quit smoking about 16 years ago. She has never used smokeless tobacco. She reports that she does not drink alcohol or use illicit drugs.   ALLERGIES: Aspirin; Erythromycin; and Niacin and related   MEDICATIONS:  Current Outpatient Prescriptions  Medication Sig Dispense Refill  . albuterol (PROVENTIL HFA;VENTOLIN HFA) 108 (90 BASE) MCG/ACT inhaler Inhale 2 puffs into the lungs every 6 (six) hours as needed.      . ALPRAZolam (XANAX) 1 MG tablet Take 1 mg by mouth at bedtime as needed.      Marland Kitchen amLODipine (NORVASC) 5 MG tablet       . doxycycline (VIBRAMYCIN) 100 MG capsule Take 100 mg by mouth 2 (two) times daily.      Marland Kitchen estradiol (ESTRACE) 2 MG tablet       . HYDROcodone-acetaminophen (LORCET) 10-650 MG per tablet Take 1 tablet by mouth every 6 (six) hours as needed.      Marland Kitchen ipratropium-albuterol (DUONEB) 0.5-2.5 (3) MG/3ML SOLN Take 3 mLs by nebulization.      . lidocaine (XYLOCAINE) 5 % ointment Apply 1 application topically as needed.      Marland Kitchen olmesartan (  BENICAR) 40 MG tablet Take 40 mg by mouth daily.      Marland Kitchen omeprazole (PRILOSEC) 20 MG capsule Take 20 mg by mouth daily.      . Phenazopyridine HCl (AZO TABS PO) Take by mouth.      . pravastatin (PRAVACHOL) 40 MG tablet Take 40 mg by mouth daily.       No current facility-administered medications for this encounter.     REVIEW OF SYSTEMS:  A 15 point review of systems is documented in the electronic medical record. This was obtained by the nursing staff. However, I reviewed this with the patient to discuss relevant findings and make  appropriate changes.  Pertinent items are noted in HPI.    PHYSICAL EXAM:  height is 5\' 2"  (1.575 m) and weight is 106 lb 4.8 oz (48.217 kg). Her oral temperature is 97.8 F (36.6 C). Her blood pressure is 140/68 and her pulse is 83. Her respiration is 20 and oxygen saturation is 90%.   General: Well-developed, in no acute distress HEENT: Normocephalic, atraumatic; oral cavity clear Neck: Supple without any lymphadenopathy Cardiovascular: Regular rate and rhythm Respiratory: Clear to auscultation bilaterally GI: Soft, nontender, normal bowel sounds Extremities: No edema present Neuro: No focal deficits Rectal:   An obstructing mass is present several centimeters proximal to the anal urge. The patient had some discomfort and the exam was limited due to previous severe pain after digital rectal exams. The patient did agree to a limited exam today.    LABORATORY DATA:  No results found for this basename: WBC, HGB, HCT, MCV, PLT   No results found for this basename: NA, K, CL, CO2   No results found for this basename: ALT, AST, GGT, ALKPHOS, BILITOT      RADIOGRAPHY: Ct Chest W Contrast  02/05/2013   CLINICAL DATA:  Anal carcinoma. Pre chemotherapy and radiation therapy. Evaluate for metastatic disease.  EXAM: CT CHEST WITH CONTRAST  TECHNIQUE: Multidetector CT imaging of the chest was performed during intravenous contrast administration.  CONTRAST:  80mL OMNIPAQUE IOHEXOL 300 MG/ML  SOLN  COMPARISON:  PET 01/2013.  FINDINGS: No pathologically enlarged mediastinal, hilar or axillary lymph nodes. Atherosclerotic calcification of the arterial vasculature, including three-vessel involvement of the coronary arteries. Heart size normal. No pericardial effusion. Small hiatal hernia.  Biapical pleural parenchymal scarring. Severe centrilobular emphysema. Mild scattered pulmonary parenchymal scarring. No pleural fluid. Airway is unremarkable.  Incidental imaging of the upper abdomen shows no acute  findings. No worrisome lytic or sclerotic lesions.  IMPRESSION: 1. No evidence of metastatic disease in the chest. 2. Three-vessel coronary artery calcification. 3. Severe centrilobular emphysema.   Electronically Signed   By: Leanna Battles M.D.   On: 02/05/2013 15:53   Ct Abdomen Pelvis W Contrast  01/22/2013   ADDENDUM REPORT: 01/22/2013 14:09  ADDENDUM: Case was discussed with Dr. Loreta Ave at the time of addendum.  Clinically, a focal mass is suspected. However, while focal wall thickening/edema is present along the left lateral aspect of the upper right dome (series 2/image 58), the overall appearance is notable for more diffuse wall thickening (series 2/ image 61). As such, infection/inflammation is favored, although neoplasm remains possible.  Given the suspected (nonvisualized) fistulous tract between the fluid collection and rectum, surgical consultation was suggested. If further imaging is desired, consider MR pelvis with/without contrast (anorectal fistula protocol).   Electronically Signed   By: Charline Bills M.D.   On: 01/22/2013 14:09   01/22/2013   CLINICAL DATA:  Rectal  mass on colonoscopy, diarrhea, blood in stool  EXAM: CT ABDOMEN AND PELVIS WITH CONTRAST  TECHNIQUE: Multidetector CT imaging of the abdomen and pelvis was performed using the standard protocol following bolus administration of intravenous contrast.  BUN and creatinine were obtained on site at [facility] at [address]  Results:  BUN 8 mg/dL,  Creatinine 6 mg/dL.  CONTRAST:  100 mL Omnipaque 300 IV  COMPARISON:  None.  FINDINGS: Moderate to severe emphysematous changes at the lung bases.  Small hiatal hernia.  Three tiny hypoenhancing liver lesions which likely reflects cysts (series 2/images 9, 16, and 25).  Spleen, pancreas, and adrenal glands are within normal limits.  Gallbladder is unremarkable. No intrahepatic or extrahepatic ductal dilatation.  Kidneys are within normal limits. No hydronephrosis.  No  evidence of bowel obstruction. Rectal wall thickening (series 2/ image 61). Associated 3.2 x 2.6 cm gas and fluid collection along the left lateral aspect of the lower rectum (series 2/ image 67). Although suspected, a definite fistulous communication between the collection and the rectal lumen is not visualized by CT.  Atherosclerotic calcifications of the abdominal aorta and branch vessels.  No abdominopelvic ascites.  Small retroperitoneal lymph nodes which do not meet pathologic CT size criteria.  Status post hysterectomy.  Bilateral ovaries are unremarkable.  Bladder is within normal limits.  Degenerative changes of the visualized thoracolumbar spine.  IMPRESSION: Rectal wall thickening, possibly reflecting proctitis.  Associated 3.2 x 2.6 cm left perirectal gas and fluid collection. Although not visualized, fistula communication between the collection and the rectal lumen is suspected.  Electronically Signed: By: Charline Bills M.D. On: 01/22/2013 12:59   Nm Pet Image Initial (pi) Skull Base To Thigh  02/05/2013   CLINICAL DATA:  Initial treatment strategy for anal carcinoma.  EXAM: NUCLEAR MEDICINE PET SKULL BASE TO THIGH  FASTING BLOOD GLUCOSE:  Value: 93mg /dl  TECHNIQUE: 16.1 mCi W-96 FDG was injected intravenously. CT data was obtained and used for attenuation correction and anatomic localization only. (This was not acquired as a diagnostic CT examination.) Additional exam technical data entered on technologist worksheet.  COMPARISON:  CT chest 02/05/2013 and CT abdomen pelvis 01/22/2013.  FINDINGS: NECK  No hypermetabolic lymph nodes in the neck. CT images show no acute findings. A retention cyst or polyp is seen in the left maxillary sinus. Paranasal sinuses and mastoid air cells are otherwise clear.  CHEST  No hypermetabolic mediastinal or hilar lymph nodes. No hypermetabolic pulmonary nodules. CT images show fairly extensive atherosclerotic calcification of the arterial vasculature, including  three-vessel involvement of the coronary arteries. No pericardial effusion. Small hiatal hernia. Severe centrilobular emphysema. No pleural fluid.  ABDOMEN/PELVIS  Eccentric soft tissue thickening in the anal rectal region has an SUV max of 17.0. No additional areas of abnormal hypermetabolism in the abdomen or pelvis. No hypermetabolic lymph nodes.  CT images show the liver, gallbladder, adrenal glands, kidneys, spleen, pancreas, stomach and bowel to be otherwise grossly unremarkable. No pathologically enlarged lymph nodes. No free fluid. Hysterectomy. Atherosclerotic calcification of the arterial vasculature without abdominal aortic aneurysm.  SKELETON  No focal hypermetabolic activity to suggest skeletal metastasis.  IMPRESSION: 1. Hypermetabolic anorectal mass, consistent with the given history of anal carcinoma. No evidence of metastatic disease. 2. Three-vessel coronary artery calcification. 3. Severe centrilobular emphysema.   Electronically Signed   By: Leanna Battles M.D.   On: 02/05/2013 15:59       IMPRESSION: The patient has a new diagnosis of squamous cell carcinoma  of the anal canal. This anatomically appeared to represent a possible distal rectal tumor but the pathology revealed invasive squamous cell carcinoma. No clear regional lymphadenopathy present although the patient does have a fluid collection/possible abscess in the left perirectal region. The patient therefore has a locally advanced tumor it appears, potentially a T2/3N0M0 tumor, again with PET scan pending which will be helpful to finalize her staging. Given the patient's histology, I would favor treating this as an anal cancer.  I discussed with the patient therefore the role of radiotherapy in this setting, typically in conjunction with chemotherapy. She is scheduled to see Dr. Truett Perna and medical oncology next week. I discussed with her a potential 5-1/2-6 week course of treatment. We discussed the logistics of treatment and the  target areas. We discussed the benefit of such a treatment in terms of tumor regression/local control. We also discussed the possible side effects and risks of treatment. All of her questions were answered.  PLAN: I will followup on the patient's staging studies today and we will schedule the patient for simulation in the near future such that we can proceed with treatment planning.  Given the upcoming schedule I will plan for the patient to tentatively begin chemoradiotherapy on 02/23/2013.   I spent 60 minutes face to face with the patient and more than 50% of that time was spent in counseling and/or coordination of care.    ________________________________   Radene Gunning, MD, PhD

## 2013-02-05 NOTE — Addendum Note (Signed)
Encounter addended by: Lowella Petties, RN on: 02/05/2013  2:56 PM<BR>     Documentation filed: Charges VN

## 2013-02-06 NOTE — Addendum Note (Signed)
Encounter addended by: Jonna Coup, MD on: 02/06/2013  8:48 AM<BR>     Documentation filed: Visit Diagnoses

## 2013-02-10 ENCOUNTER — Telehealth (INDEPENDENT_AMBULATORY_CARE_PROVIDER_SITE_OTHER): Payer: Self-pay

## 2013-02-10 NOTE — Telephone Encounter (Signed)
Message copied by Ivory Broad on Tue Feb 10, 2013 11:13 AM ------      Message from: [place], Broadus John.      Created: Mon Feb 09, 2013  8:52 AM      Regarding: Results       Please let her know that her CT and PET show no signs of disease spread but she does have some heart and lung disease that we can see on the imaging.              Thanks,      AT      ----- Message -----         From: Rad Results In Interface         Sent: 02/05/2013   4:01 PM           To: Romie Levee, MD                   ------

## 2013-02-10 NOTE — Telephone Encounter (Signed)
I called and notified the pt there was no metastasis on her scans.  It showed emphysema which she has along with COPD and she has a lung dr.

## 2013-02-11 ENCOUNTER — Telehealth: Payer: Self-pay | Admitting: Oncology

## 2013-02-11 ENCOUNTER — Encounter: Payer: Self-pay | Admitting: Oncology

## 2013-02-11 ENCOUNTER — Ambulatory Visit (HOSPITAL_BASED_OUTPATIENT_CLINIC_OR_DEPARTMENT_OTHER): Payer: Medicare Other | Admitting: Oncology

## 2013-02-11 ENCOUNTER — Other Ambulatory Visit: Payer: Self-pay | Admitting: *Deleted

## 2013-02-11 ENCOUNTER — Ambulatory Visit
Admission: RE | Admit: 2013-02-11 | Discharge: 2013-02-11 | Disposition: A | Discharge status: Home / Self Care | Payer: Medicare Other | Source: Ambulatory Visit | Attending: Radiation Oncology | Admitting: Radiation Oncology

## 2013-02-11 ENCOUNTER — Ambulatory Visit: Payer: Medicare Other

## 2013-02-11 VITALS — BP 152/67 | HR 74 | Temp 96.7°F | Resp 18 | Ht 62.75 in | Wt 103.7 lb

## 2013-02-11 DIAGNOSIS — K612 Anorectal abscess: Secondary | ICD-10-CM

## 2013-02-11 DIAGNOSIS — J449 Chronic obstructive pulmonary disease, unspecified: Secondary | ICD-10-CM | POA: Diagnosis not present

## 2013-02-11 DIAGNOSIS — K6289 Other specified diseases of anus and rectum: Secondary | ICD-10-CM | POA: Insufficient documentation

## 2013-02-11 DIAGNOSIS — C210 Malignant neoplasm of anus, unspecified: Secondary | ICD-10-CM

## 2013-02-11 DIAGNOSIS — Z510 Encounter for antineoplastic radiation therapy: Secondary | ICD-10-CM | POA: Insufficient documentation

## 2013-02-11 DIAGNOSIS — R197 Diarrhea, unspecified: Secondary | ICD-10-CM | POA: Diagnosis not present

## 2013-02-11 DIAGNOSIS — E860 Dehydration: Secondary | ICD-10-CM | POA: Insufficient documentation

## 2013-02-11 DIAGNOSIS — Z79899 Other long term (current) drug therapy: Secondary | ICD-10-CM | POA: Diagnosis not present

## 2013-02-11 NOTE — Progress Notes (Signed)
Met with Sara Cochran and family. Explained role of nurse navigator. Educational information provided on anal cancer  Referral made to dietician for diet education. [facility] resources provided to patient, including SW service information.  Patient declined need to see SW.   Contact names and phone numbers were provided for entire [facility] team.  Teach back method was used.  No barriers to care identified.  Will continue to follow as needed.

## 2013-02-11 NOTE — Progress Notes (Signed)
Checked in new pt with no financial concerns. °

## 2013-02-11 NOTE — Telephone Encounter (Signed)
Gave pt appt for lab,chemo class,nutrition and Md, emailed [place] regarding chemo on 12/8

## 2013-02-11 NOTE — Progress Notes (Signed)
[facility] New Patient Consult   Referring RU:[id number] Sara Cochran 79 y.o.  09-25-1933    Reason for Referral: squamous cell carcinoma of the rectum     HPI:  She reports developing constipation.the constipation was relieved with MiraLAX, but she developed frequent bowel movements and this was discontinued. She reports developing rectal pain and bleeding. She was referred to Dr. Loreta Ave and was taken to a flexible sigmoidoscopy on 01/21/2013. An obstructing mass was found in the rectum at 5 cm. The mass was circumferential and friable with oozing of blood. Multiple biopsies were obtained. The scope could not be advanced beyond the rectum. The pathology revealed invasive squamous cell carcinoma, moderate to poorly differentiated. Glandular mucosa was involved by squamous cell carcinoma.  She was referred for CTs of the abdomen and pelvis on 01/22/2013. 3 tiny hypoenhancing liver lesions were felt to represent cysts. Rectal wall thickening. There is an associated gas and fluid collection along the left lateral aspect of the lower rectum. No ascites. Small retroperitoneal nodes do not need pathologic CT size criteria. A CT of the chest on 02/05/2013 revealed severe emphysema and no evidence of metastatic disease in the chest.  A PET scan on 02/05/2013 revealed asymmetric soft tissue thickening in the anal rectal region with an SUV max of 17. No additional areas of abnormal hypermetabolism in the abdomen or pelvis. No hypermetabolic lymph nodes.  She saw Dr. Maisie Fus. The CT was felt to show a perirectal abscess, but surgery was not recommended since the area appeared to be draining.a mass was palpated 4 cm from the anal verge.  She was referred to Dr. Mitzi Hansen and is scheduled to undergo radiation simulation today.    Past Medical History  Diagnosis Date  . COPD (chronic obstructive pulmonary disease)   . High cholesterol   . Acid reflux   . Back pain-chronic   .  Migraine   .        .    Sara Cochran abscesson CT scan 01/22/2013  . Hypertension   . Cancer 01/21/13 bx     rectal cancer=invasive squamous cell ca    Past Surgical History  Procedure Laterality Date  . Cesarean section    . Abdominal hysterectomy    . Cyst removal hand      3 removed from right hand  . Colonoscopy w/ polypectomy  01/23/2001    polypoid colonic mucosa,no adenomatous change or malignancy identified  . Rectal biopsy  01/21/2013    invasive squamous cell ca,mod to poorly differentiatiated    Family History  Problem Relation Age of Onset  . Heart disease Mother   . Stroke Mother   . Cancer Sister 72s    brain    Current outpatient prescriptions:albuterol (PROVENTIL HFA;VENTOLIN HFA) 108 (90 BASE) MCG/ACT inhaler, Inhale 2 puffs into the lungs every 6 (six) hours as needed., Disp: , Rfl: ;  ALPRAZolam (XANAX) 1 MG tablet, Take 1 mg by mouth at bedtime as needed., Disp: , Rfl: ;  amLODipine (NORVASC) 5 MG tablet, Take 5 mg by mouth daily. , Disp: , Rfl: ;  doxycycline (VIBRAMYCIN) 100 MG capsule, Take 100 mg by mouth 2 (two) times daily., Disp: , Rfl:  estradiol (ESTRACE) 2 MG tablet, Take 2 mg by mouth daily. , Disp: , Rfl: ;  HYDROcodone-acetaminophen (LORCET) 10-650 MG per tablet, Take 1 tablet by mouth every 6 (six) hours as needed., Disp: , Rfl: ;  ipratropium-albuterol (DUONEB) 0.5-2.5 (3) MG/3ML SOLN,  Take 3 mLs by nebulization., Disp: , Rfl: ;  lidocaine (XYLOCAINE) 5 % ointment, Apply 1 application topically as needed., Disp: , Rfl:  olmesartan (BENICAR) 40 MG tablet, Take 40 mg by mouth daily., Disp: , Rfl: ;  omeprazole (PRILOSEC) 20 MG capsule, Take 20 mg by mouth daily., Disp: , Rfl: ;  pravastatin (PRAVACHOL) 40 MG tablet, Take 40 mg by mouth daily., Disp: , Rfl: ;  Phenazopyridine HCl (AZO TABS PO), Take by mouth as needed. , Disp: , Rfl:   Allergies:  Allergies  Allergen Reactions  . Aspirin Shortness Of Breath and Swelling  . Erythromycin Hives and  Rash  . Niacin And Related Other (See Comments)    Redness all over     Social History: she lives with her son in [place] she is retired from the [employer], [employer]. She has a history of tobacco use and quit 17 years ago. She does not use alcohol. No transfusion history. No risk factor for HIV or hepatitis.   ROS:   Positives include:rectal urgency and pain, exertional dyspnea, 30 pound weight loss over the past year, anorexia  A complete ROS was otherwise negative.  Physical Exam:  Blood pressure 152/67, pulse 74, temperature 96.7 F (35.9 C), temperature source Oral, resp. rate 18, height 5' 2.75" (1.594 m), weight 103 lb 11.2 oz (47.038 kg).  HEENT: upper and lower denture plate, oropharynx without visible mass, neck without mass Lungs: distant breath sounds, no respiratory distress Cardiac: regular rate and rhythm Abdomen: no hepatosplenomegaly, nontender, no mass  Vascular: no leg edema Lymph nodes: no cervical, supraclavicular, axillary, or inguinal nodes Neurologic: alert and oriented, the motor exam appears intact in the upper and lower extremities Skin: no rash, 1 cm area of erythema at the left low abdominal wall (a "tick" was removed in this area in radiation oncology today)  Musculoskeletal: no spine tenderness   LAB:  CMP      Component Value Date/Time   BUN 14 02/03/2013 1257   CREATININE 0.62 02/03/2013 1257   CEA on 01/23/2013-1.4  Radiology:as per history of present illness   Assessment/Plan:   1.squamous cell carcinoma of the distal rectum/anal canal, early stage disease based on the staging evaluation today  2. CT evidence of a perirectal abscess  3. COPD   Disposition:   She has been diagnosed with squamous cell carcinoma of the distal rectum. We discussed the diagnosis and treatment options. The tumor is more proximal than the typical anal cancer, but I agree with treating this squamous cell carcinoma as a "anal "tumor. She is scheduled to  undergo radiation simulation today with the plan to begin radiation on 02/23/2013. I recommend concurrent 5-fluorouracil and mitomycin C. Chemotherapy to be given during the first and fifth weeks of radiation.  We reviewed the potential toxicities associated with this chemotherapy regimen including the chance for nausea/vomiting, mucositis, alopecia, diarrhea, and hematologic toxicity. We discussed the hyperpigmentation and hand/foot syndrome associated with 5-fluorouracil. We reviewed the hemolytic uremic syndrome seen with mitomycin. She understands the potential for skin toxicity with combined therapy.She will attend a chemotherapy teaching class. Arrangements will be made for placement of a PICC prior to chemotherapy.  Ms. Northrop will return for an office visit and nadir CBC on 03/05/2013.  Approximately 50 minutes were spent with the patient today. The majority of the time was used for counseling and coordination of care.  Sara Cochran 02/11/2013, 5:37 PM

## 2013-02-11 NOTE — Progress Notes (Signed)
Currently on antibiotic for recent dog bite to left forehead (finishes today). Reports being anxious today and tearful at times. Some depression still from loss of her husband Nov 2013 and family dynamics it left behind and new diagnosis. Offered support and consult with social worker for counseling, but she declines. Feels she will be OK and has good support with her son and his family and her step daughter, Ashok Cordia.

## 2013-02-13 ENCOUNTER — Telehealth: Payer: Self-pay | Admitting: *Deleted

## 2013-02-13 NOTE — Telephone Encounter (Signed)
Per staff message and POF I have scheduled appts.  JMW  

## 2013-02-17 ENCOUNTER — Telehealth: Payer: Self-pay | Admitting: Oncology

## 2013-02-17 ENCOUNTER — Telehealth: Payer: Self-pay | Admitting: *Deleted

## 2013-02-17 ENCOUNTER — Other Ambulatory Visit (HOSPITAL_BASED_OUTPATIENT_CLINIC_OR_DEPARTMENT_OTHER): Payer: Medicare Other | Admitting: Lab

## 2013-02-17 DIAGNOSIS — C210 Malignant neoplasm of anus, unspecified: Secondary | ICD-10-CM

## 2013-02-17 LAB — CBC WITH DIFFERENTIAL/PLATELET
BASO%: 0.6 % (ref 0.0–2.0)
Basophils Absolute: 0 10*3/uL (ref 0.0–0.1)
HCT: 40.8 % (ref 34.8–46.6)
HGB: 13.2 g/dL (ref 11.6–15.9)
LYMPH%: 33.1 % (ref 14.0–49.7)
MCHC: 32.3 g/dL (ref 31.5–36.0)
MONO#: 0.4 10*3/uL (ref 0.1–0.9)
NEUT%: 58.6 % (ref 38.4–76.8)
Platelets: 256 10*3/uL (ref 145–400)
WBC: 5.5 10*3/uL (ref 3.9–10.3)
lymph#: 1.8 10*3/uL (ref 0.9–3.3)

## 2013-02-17 LAB — COMPREHENSIVE METABOLIC PANEL (CC13)
ALT: 10 U/L (ref 0–55)
Alkaline Phosphatase: 46 U/L (ref 40–150)
Anion Gap: 16 mEq/L — ABNORMAL HIGH (ref 3–11)
Creatinine: 0.7 mg/dL (ref 0.6–1.1)
Glucose: 117 mg/dl (ref 70–140)
Sodium: 145 mEq/L (ref 136–145)
Total Bilirubin: 0.54 mg/dL (ref 0.20–1.20)
Total Protein: 6.5 g/dL (ref 6.4–8.3)

## 2013-02-17 NOTE — Telephone Encounter (Signed)
lvm for Dr. Truett Perna desck nurse to call pt. Pt came in concerned that she was told that the chemo was for 5 days and pump need to come off on friday...Marland Kitchenpof states different

## 2013-02-17 NOTE — Telephone Encounter (Signed)
Message from [facility] in scheduling, order was entered for pump DC 12/10- pt stated she was told she'd have pump for 5 days. Per last office note, pt should have pump disconnected Fri 02/27/13. Requested chemo scheduler change pump DC appt. Called pt with clarification, she voiced understanding. Will pick up new appt on 12/04 during chemo education class.

## 2013-02-18 ENCOUNTER — Telehealth: Payer: Self-pay | Admitting: Oncology

## 2013-02-18 ENCOUNTER — Other Ambulatory Visit: Payer: Self-pay | Admitting: *Deleted

## 2013-02-18 DIAGNOSIS — C210 Malignant neoplasm of anus, unspecified: Secondary | ICD-10-CM

## 2013-02-18 NOTE — Telephone Encounter (Signed)
Talked to pt and gave her appt for for December 2015, chemo class,labs, chemo and MD

## 2013-02-19 ENCOUNTER — Encounter: Payer: Self-pay | Admitting: *Deleted

## 2013-02-19 ENCOUNTER — Other Ambulatory Visit: Payer: Medicare Other

## 2013-02-19 ENCOUNTER — Telehealth: Payer: Self-pay | Admitting: Oncology

## 2013-02-19 ENCOUNTER — Ambulatory Visit: Payer: Medicare Other | Admitting: Nutrition

## 2013-02-19 NOTE — Progress Notes (Signed)
Patient is a 79 year old female diagnosed with anal cancer.  She is a patient of Dr. Truett Perna.  Past medical history includes COPD, hypercholesterolemia, migraines, hypertension, acid reflux, and weight loss.  Medications include Xanax, and Pravachol.  Labs were reviewed.  Height: 62 inches. Weight: 101.6 pounds December 4. Usual body weight: 138 pounds. BMI: 18.14.  Patient states she is scared, anxious and nervous about pending treatments.  She has chemotherapy education scheduled after this.  Patient also reports she is depressed.  Dietary history reveals patient does consume a variety of foods and eats small amounts.  She worries about increased gas and her bowel movements.  She reports she does take four stool softeners daily.  Her weight continues to decline and reflects 26% weight loss over one year.  Nutrition diagnosis: Unintended weight loss related to new diagnosis of anal cancer as evidenced by 26% weight loss from usual body weight.  Intervention: Patient educated to consume smaller, more frequent meals and snacks throughout the day.  I have reviewed, high-protein foods with patient.  I've educated her on potential gas-forming foods and educated her she may need to limit these if they bother her.  Recommend patient consume Ensure Plus or boost plus BID throughout the day.  I provided fact sheets on ways to increase calories and protein as well as tips to reduce overall gas.  Questions were answered.  Teach back method used.  I also provided oral nutrition supplement samples for patient to take today.  Monitoring, evaluation, goals: Patient will tolerate increased calories and protein to minimize further weight loss.  Next visit: Monday, December 15, after radiation therapy.

## 2013-02-19 NOTE — Telephone Encounter (Signed)
Gave pt appt for the whole month of December 2014 lab, portacath. flush, md and chemo

## 2013-02-20 ENCOUNTER — Encounter: Payer: Self-pay | Admitting: *Deleted

## 2013-02-20 ENCOUNTER — Other Ambulatory Visit: Payer: Self-pay | Admitting: Oncology

## 2013-02-20 ENCOUNTER — Ambulatory Visit (HOSPITAL_COMMUNITY)
Admission: RE | Admit: 2013-02-20 | Discharge: 2013-02-20 | Disposition: A | Discharge status: Home / Self Care | Payer: Medicare Other | Source: Ambulatory Visit | Attending: Oncology | Admitting: Oncology

## 2013-02-20 ENCOUNTER — Other Ambulatory Visit: Payer: Self-pay | Admitting: Gastroenterology

## 2013-02-20 DIAGNOSIS — C210 Malignant neoplasm of anus, unspecified: Secondary | ICD-10-CM | POA: Diagnosis not present

## 2013-02-20 DIAGNOSIS — R197 Diarrhea, unspecified: Secondary | ICD-10-CM | POA: Diagnosis not present

## 2013-02-20 DIAGNOSIS — C20 Malignant neoplasm of rectum: Secondary | ICD-10-CM | POA: Diagnosis not present

## 2013-02-20 DIAGNOSIS — Z510 Encounter for antineoplastic radiation therapy: Secondary | ICD-10-CM | POA: Diagnosis not present

## 2013-02-20 DIAGNOSIS — K6289 Other specified diseases of anus and rectum: Secondary | ICD-10-CM | POA: Diagnosis not present

## 2013-02-20 DIAGNOSIS — E860 Dehydration: Secondary | ICD-10-CM | POA: Diagnosis not present

## 2013-02-20 MED ORDER — HEPARIN SOD (PORK) LOCK FLUSH 100 UNIT/ML IV SOLN
INTRAVENOUS | Status: AC
  Filled 2013-02-20: qty 5

## 2013-02-20 NOTE — Progress Notes (Signed)
[facility] Psychosocial Distress Screening Clinical Social Work  Clinical Social Work was referred by distress screening protocol.  The patient scored a 10 on the Psychosocial Distress Thermometer which indicates severe distress. Clinical Social Worker phoned Pt to assess for distress and other psychosocial needs. Pt described many stressful family concerns in the last year. Death of husband, selling home and moving in with children and stress related to stepchildren. Pt reports difficulty coping with recent cancer diagnosis and treatment plan. She shared she does have good support from her children and her stepdaughter. Pt reports to be aware of Support Programs and CSW support. Pt denies current crisis or SI and feels she is doing "the best she can". She agrees to meet with CSW to assess additional needs next week on 02/25/13 after radiation.    Clinical Social Worker follow up needed: yes  If yes, follow up plan: She agrees to meet with CSW to assess additional needs next week on 02/25/13 after radiation.    Doreen Salvage, LCSW Clinical Social Worker [facility] Wednesday, Thursday and Friday Phone: [phone] Fax: [phone]

## 2013-02-20 NOTE — Procedures (Signed)
Successful placement of dual lumen PICC line to right basilic vein. Length 38cm Tip at lower SVC/RA No complications Ready for use.  Brayton El PA-C Interventional Radiology 02/20/2013 1:17 PM

## 2013-02-21 ENCOUNTER — Ambulatory Visit (HOSPITAL_BASED_OUTPATIENT_CLINIC_OR_DEPARTMENT_OTHER): Payer: Medicare Other

## 2013-02-21 ENCOUNTER — Other Ambulatory Visit: Payer: Self-pay | Admitting: Oncology

## 2013-02-21 VITALS — BP 157/74 | HR 105 | Temp 97.0°F | Resp 20

## 2013-02-21 DIAGNOSIS — Z452 Encounter for adjustment and management of vascular access device: Secondary | ICD-10-CM | POA: Diagnosis not present

## 2013-02-21 DIAGNOSIS — C210 Malignant neoplasm of anus, unspecified: Secondary | ICD-10-CM

## 2013-02-21 DIAGNOSIS — Z95828 Presence of other vascular implants and grafts: Secondary | ICD-10-CM

## 2013-02-21 MED ORDER — SODIUM CHLORIDE 0.9 % IJ SOLN
10.0000 mL | INTRAMUSCULAR | Status: DC | PRN
  Administered 2013-02-21: 10 mL via INTRAVENOUS
  Filled 2013-02-21: qty 10

## 2013-02-21 MED ORDER — HEPARIN SOD (PORK) LOCK FLUSH 100 UNIT/ML IV SOLN
500.0000 [IU] | Freq: Once | INTRAVENOUS | Status: AC
  Administered 2013-02-21: 500 [IU] via INTRAVENOUS
  Filled 2013-02-21: qty 5

## 2013-02-21 NOTE — Progress Notes (Signed)
Surgical dressing changed.  Saline flushed.  Hep locked with 250 units each.

## 2013-02-23 ENCOUNTER — Ambulatory Visit (HOSPITAL_BASED_OUTPATIENT_CLINIC_OR_DEPARTMENT_OTHER): Payer: Medicare Other

## 2013-02-23 ENCOUNTER — Other Ambulatory Visit: Payer: Self-pay | Admitting: *Deleted

## 2013-02-23 ENCOUNTER — Ambulatory Visit
Admission: RE | Admit: 2013-02-23 | Discharge: 2013-02-23 | Disposition: A | Discharge status: Home / Self Care | Payer: Medicare Other | Source: Ambulatory Visit | Attending: Radiation Oncology | Admitting: Radiation Oncology

## 2013-02-23 ENCOUNTER — Other Ambulatory Visit (HOSPITAL_BASED_OUTPATIENT_CLINIC_OR_DEPARTMENT_OTHER): Payer: Medicare Other | Admitting: Lab

## 2013-02-23 VITALS — BP 118/52 | HR 84 | Temp 98.1°F | Resp 20

## 2013-02-23 DIAGNOSIS — R197 Diarrhea, unspecified: Secondary | ICD-10-CM | POA: Diagnosis not present

## 2013-02-23 DIAGNOSIS — C210 Malignant neoplasm of anus, unspecified: Secondary | ICD-10-CM

## 2013-02-23 DIAGNOSIS — Z5111 Encounter for antineoplastic chemotherapy: Secondary | ICD-10-CM | POA: Diagnosis not present

## 2013-02-23 DIAGNOSIS — E860 Dehydration: Secondary | ICD-10-CM | POA: Diagnosis not present

## 2013-02-23 DIAGNOSIS — Z510 Encounter for antineoplastic radiation therapy: Secondary | ICD-10-CM | POA: Diagnosis not present

## 2013-02-23 DIAGNOSIS — K6289 Other specified diseases of anus and rectum: Secondary | ICD-10-CM | POA: Diagnosis not present

## 2013-02-23 LAB — BASIC METABOLIC PANEL (CC13)
CO2: 29 mEq/L (ref 22–29)
Chloride: 103 mEq/L (ref 98–109)
Glucose: 123 mg/dl (ref 70–140)
Potassium: 3.4 mEq/L — ABNORMAL LOW (ref 3.5–5.1)
Sodium: 141 mEq/L (ref 136–145)

## 2013-02-23 MED ORDER — MITOMYCIN CHEMO IV INJECTION 20 MG
8.0000 mg/m2 | Freq: Once | INTRAVENOUS | Status: AC
  Administered 2013-02-23: 11.5 mg via INTRAVENOUS
  Filled 2013-02-23: qty 23

## 2013-02-23 MED ORDER — ONDANSETRON 8 MG/NS 50 ML IVPB
INTRAVENOUS | Status: AC
  Filled 2013-02-23: qty 8

## 2013-02-23 MED ORDER — ONDANSETRON 8 MG/50ML IVPB (CHCC)
8.0000 mg | Freq: Once | INTRAVENOUS | Status: AC
  Administered 2013-02-23: 8 mg via INTRAVENOUS

## 2013-02-23 MED ORDER — SODIUM CHLORIDE 0.9 % IV SOLN
Freq: Once | INTRAVENOUS | Status: AC
  Administered 2013-02-23: 13:00:00 via INTRAVENOUS

## 2013-02-23 MED ORDER — DEXAMETHASONE SODIUM PHOSPHATE 10 MG/ML IJ SOLN
10.0000 mg | Freq: Once | INTRAMUSCULAR | Status: AC
  Administered 2013-02-23: 10 mg via INTRAVENOUS

## 2013-02-23 MED ORDER — PROCHLORPERAZINE MALEATE 5 MG PO TABS: 5.0000 mg | ORAL_TABLET | Freq: Four times a day (QID) | ORAL | Status: DC | PRN

## 2013-02-23 MED ORDER — HEPARIN SOD (PORK) LOCK FLUSH 100 UNIT/ML IV SOLN
500.0000 [IU] | Freq: Once | INTRAVENOUS | Status: DC | PRN
  Filled 2013-02-23: qty 5

## 2013-02-23 MED ORDER — SODIUM CHLORIDE 0.9 % IJ SOLN
10.0000 mL | INTRAMUSCULAR | Status: DC | PRN
  Filled 2013-02-23: qty 10

## 2013-02-23 MED ORDER — DEXAMETHASONE SODIUM PHOSPHATE 10 MG/ML IJ SOLN
INTRAMUSCULAR | Status: AC
  Filled 2013-02-23: qty 1

## 2013-02-23 MED ORDER — SODIUM CHLORIDE 0.9 % IV SOLN
800.0000 mg/m2/d | INTRAVENOUS | Status: DC
  Administered 2013-02-23: 4600 mg via INTRAVENOUS
  Filled 2013-02-23: qty 92

## 2013-02-23 NOTE — Patient Instructions (Signed)
[facility] Discharge Instructions for Patients Receiving Chemotherapy  Today you received the following chemotherapy agents: mitamycin, 5FU  To help prevent nausea and vomiting after your treatment, we encourage you to take your nausea medication.  Take it as often as prescribed.     If you develop nausea and vomiting that is not controlled by your nausea medication, call the clinic. If it is after clinic hours your family physician or the after hours number for the clinic or go to the Emergency Department.   BELOW ARE SYMPTOMS THAT SHOULD BE REPORTED IMMEDIATELY:  *FEVER GREATER THAN 100.5 F  *CHILLS WITH OR WITHOUT FEVER  NAUSEA AND VOMITING THAT IS NOT CONTROLLED WITH YOUR NAUSEA MEDICATION  *UNUSUAL SHORTNESS OF BREATH  *UNUSUAL BRUISING OR BLEEDING  TENDERNESS IN MOUTH AND THROAT WITH OR WITHOUT PRESENCE OF ULCERS  *URINARY PROBLEMS  *BOWEL PROBLEMS  UNUSUAL RASH Items with * indicate a potential emergency and should be followed up as soon as possible.  One of the nurses will contact you 24 hours after your treatment. Please let the nurse know about any problems that you may have experienced. Feel free to call the clinic you have any questions or concerns. The clinic phone number is [phone].   I have been informed and understand all the instructions given to me. I know to contact the clinic, my physician, or go to the Emergency Department if any problems should occur. I do not have any questions at this time, but understand that I may call the clinic during office hours   should I have any questions or need assistance in obtaining follow up care.    __________________________________________  _____________  __________ Signature of Patient or Authorized Representative            Date                   Time    __________________________________________ Nurse's Signature    Mitomycin injection What is this medicine? MITOMYCIN (mye toe MYE  sin) is a chemotherapy drug. This medicine is used to treat cancer of the stomach and pancreas. This medicine may be used for other purposes; ask your health care provider or pharmacist if you have questions. COMMON BRAND NAME(S): Mutamycin What should I tell my health care provider before I take this medicine? They need to know if you have any of these conditions: -anemia -bleeding disorder -infection (especially a virus infection such as chickenpox, cold sores, or herpes) -kidney disease -low blood counts like low platelets, red blood cells, white blood cells -recent radiation therapy -an unusual or allergic reaction to mitomycin, other chemotherapy agents, other medicines, foods, dyes, or preservatives -pregnant or trying to get pregnant -breast-feeding How should I use this medicine? This drug is given as an injection or infusion into a vein. It is administered in a hospital or clinic by a specially trained health care professional. Talk to your pediatrician regarding the use of this medicine in children. Special care may be needed. Overdosage: If you think you have taken too much of this medicine contact a poison control center or emergency room at once. NOTE: This medicine is only for you. Do not share this medicine with others. What if I miss a dose? It is important not to miss your dose. Call your doctor or health care professional if you are unable to keep an appointment. What may interact with this medicine? -medicines to increase blood counts like filgrastim, pegfilgrastim, sargramostim -vaccines This list  may not describe all possible interactions. Give your health care provider a list of all the medicines, herbs, non-prescription drugs, or dietary supplements you use. Also tell them if you smoke, drink alcohol, or use illegal drugs. Some items may interact with your medicine. What should I watch for while using this medicine? Your condition will be monitored carefully while you  are receiving this medicine. You will need important blood work done while you are taking this medicine. This drug may make you feel generally unwell. This is not uncommon, as chemotherapy can affect healthy cells as well as cancer cells. Report any side effects. Continue your course of treatment even though you feel ill unless your doctor tells you to stop. Call your doctor or health care professional for advice if you get a fever, chills or sore throat, or other symptoms of a cold or flu. Do not treat yourself. This drug decreases your body's ability to fight infections. Try to avoid being around people who are sick. This medicine may increase your risk to bruise or bleed. Call your doctor or health care professional if you notice any unusual bleeding. Be careful brushing and flossing your teeth or using a toothpick because you may get an infection or bleed more easily. If you have any dental work done, tell your dentist you are receiving this medicine. Avoid taking products that contain aspirin, acetaminophen, ibuprofen, naproxen, or ketoprofen unless instructed by your doctor. These medicines may hide a fever. Do not become pregnant while taking this medicine. Women should inform their doctor if they wish to become pregnant or think they might be pregnant. There is a potential for serious side effects to an unborn child. Talk to your health care professional or pharmacist for more information. Do not breast-feed an infant while taking this medicine. What side effects may I notice from receiving this medicine? Side effects that you should report to your doctor or health care professional as soon as possible: -allergic reactions like skin rash, itching or hives, swelling of the face, lips, or tongue -low blood counts - this medicine may decrease the number of white blood cells, red blood cells and platelets. You may be at increased risk for infections and bleeding. -signs of infection - fever or chills,  cough, sore throat, pain or difficulty passing urine -signs of decreased platelets or bleeding - bruising, pinpoint red spots on the skin, black, tarry stools, blood in the urine -signs of decreased red blood cells - unusually weak or tired, fainting spells, lightheadedness -breathing problems -changes in vision -chest pain -confusion -dry cough -high blood pressure -mouth sores -pain, swelling, redness at site where injected -pain, tingling, numbness in the hands or feet -seizures -swelling of the ankles, feet, hands -trouble passing urine or change in the amount of urine Side effects that usually do not require medical attention (report to your doctor or health care professional if they continue or are bothersome): -diarrhea -green to blue color of urine -hair loss -loss of appetite -nausea, vomiting This list may not describe all possible side effects. Call your doctor for medical advice about side effects. You may report side effects to FDA at 1-800-FDA-1088. Where should I keep my medicine? This drug is given in a hospital or clinic and will not be stored at home. NOTE: This sheet is a summary. It may not cover all possible information. If you have questions about this medicine, talk to your doctor, pharmacist, or health care provider.  2014, Elsevier/Gold Standard. (2007-09-11 11:16:23)  Fluorouracil, 5-FU injection What is this medicine? FLUOROURACIL, 5-FU (flure oh YOOR a sil) is a chemotherapy drug. It slows the growth of cancer cells. This medicine is used to treat many types of cancer like breast cancer, colon or rectal cancer, pancreatic cancer, and stomach cancer. This medicine may be used for other purposes; ask your health care provider or pharmacist if you have questions. COMMON BRAND NAME(S): Adrucil What should I tell my health care provider before I take this medicine? They need to know if you have any of these conditions: -blood disorders -dihydropyrimidine  dehydrogenase (DPD) deficiency -infection (especially a virus infection such as chickenpox, cold sores, or herpes) -kidney disease -liver disease -malnourished, poor nutrition -recent or ongoing radiation therapy -an unusual or allergic reaction to fluorouracil, other chemotherapy, other medicines, foods, dyes, or preservatives -pregnant or trying to get pregnant -breast-feeding How should I use this medicine? This drug is given as an infusion or injection into a vein. It is administered in a hospital or clinic by a specially trained health care professional. Talk to your pediatrician regarding the use of this medicine in children. Special care may be needed. Overdosage: If you think you have taken too much of this medicine contact a poison control center or emergency room at once. NOTE: This medicine is only for you. Do not share this medicine with others. What if I miss a dose? It is important not to miss your dose. Call your doctor or health care professional if you are unable to keep an appointment. What may interact with this medicine? -allopurinol -cimetidine -dapsone -digoxin -hydroxyurea -leucovorin -levamisole -medicines for seizures like ethotoin, fosphenytoin, phenytoin -medicines to increase blood counts like filgrastim, pegfilgrastim, sargramostim -medicines that treat or prevent blood clots like warfarin, enoxaparin, and dalteparin -methotrexate -metronidazole -pyrimethamine -some other chemotherapy drugs like busulfan, cisplatin, estramustine, vinblastine -trimethoprim -trimetrexate -vaccines Talk to your doctor or health care professional before taking any of these medicines: -acetaminophen -aspirin -ibuprofen -ketoprofen -naproxen This list may not describe all possible interactions. Give your health care provider a list of all the medicines, herbs, non-prescription drugs, or dietary supplements you use. Also tell them if you smoke, drink alcohol, or use  illegal drugs. Some items may interact with your medicine. What should I watch for while using this medicine? Visit your doctor for checks on your progress. This drug may make you feel generally unwell. This is not uncommon, as chemotherapy can affect healthy cells as well as cancer cells. Report any side effects. Continue your course of treatment even though you feel ill unless your doctor tells you to stop. In some cases, you may be given additional medicines to help with side effects. Follow all directions for their use. Call your doctor or health care professional for advice if you get a fever, chills or sore throat, or other symptoms of a cold or flu. Do not treat yourself. This drug decreases your body's ability to fight infections. Try to avoid being around people who are sick. This medicine may increase your risk to bruise or bleed. Call your doctor or health care professional if you notice any unusual bleeding. Be careful brushing and flossing your teeth or using a toothpick because you may get an infection or bleed more easily. If you have any dental work done, tell your dentist you are receiving this medicine. Avoid taking products that contain aspirin, acetaminophen, ibuprofen, naproxen, or ketoprofen unless instructed by your doctor. These medicines may hide a fever. Do not become pregnant while taking  this medicine. Women should inform their doctor if they wish to become pregnant or think they might be pregnant. There is a potential for serious side effects to an unborn child. Talk to your health care professional or pharmacist for more information. Do not breast-feed an infant while taking this medicine. Men should inform their doctor if they wish to father a child. This medicine may lower sperm counts. Do not treat diarrhea with over the counter products. Contact your doctor if you have diarrhea that lasts more than 2 days or if it is severe and watery. This medicine can make you more  sensitive to the sun. Keep out of the sun. If you cannot avoid being in the sun, wear protective clothing and use sunscreen. Do not use sun lamps or tanning beds/booths. What side effects may I notice from receiving this medicine? Side effects that you should report to your doctor or health care professional as soon as possible: -allergic reactions like skin rash, itching or hives, swelling of the face, lips, or tongue -low blood counts - this medicine may decrease the number of white blood cells, red blood cells and platelets. You may be at increased risk for infections and bleeding. -signs of infection - fever or chills, cough, sore throat, pain or difficulty passing urine -signs of decreased platelets or bleeding - bruising, pinpoint red spots on the skin, black, tarry stools, blood in the urine -signs of decreased red blood cells - unusually weak or tired, fainting spells, lightheadedness -breathing problems -changes in vision -chest pain -mouth sores -nausea and vomiting -pain, swelling, redness at site where injected -pain, tingling, numbness in the hands or feet -redness, swelling, or sores on hands or feet -stomach pain -unusual bleeding Side effects that usually do not require medical attention (report to your doctor or health care professional if they continue or are bothersome): -changes in finger or toe nails -diarrhea -dry or itchy skin -hair loss -headache -loss of appetite -sensitivity of eyes to the light -stomach upset -unusually teary eyes This list may not describe all possible side effects. Call your doctor for medical advice about side effects. You may report side effects to FDA at 1-800-FDA-1088. Where should I keep my medicine? This drug is given in a hospital or clinic and will not be stored at home. NOTE: This sheet is a summary. It may not cover all possible information. If you have questions about this medicine, talk to your doctor, pharmacist, or health care  provider.  2014, Elsevier/Gold Standard. (2007-07-09 13:53:16)

## 2013-02-23 NOTE — Telephone Encounter (Signed)
Left message for pt notifying her nausea med Compazine 5 mg ordered for pt to take every 6 hours as needed per message left on Saturday 02/21/13 by infusion RN; call office if questions.

## 2013-02-24 ENCOUNTER — Ambulatory Visit
Admission: RE | Admit: 2013-02-24 | Discharge: 2013-02-24 | Disposition: A | Discharge status: Home / Self Care | Payer: Medicare Other | Source: Ambulatory Visit | Attending: Radiation Oncology | Admitting: Radiation Oncology

## 2013-02-24 ENCOUNTER — Telehealth: Payer: Self-pay | Admitting: *Deleted

## 2013-02-24 DIAGNOSIS — Z510 Encounter for antineoplastic radiation therapy: Secondary | ICD-10-CM | POA: Diagnosis not present

## 2013-02-24 DIAGNOSIS — K6289 Other specified diseases of anus and rectum: Secondary | ICD-10-CM | POA: Diagnosis not present

## 2013-02-24 DIAGNOSIS — R197 Diarrhea, unspecified: Secondary | ICD-10-CM | POA: Diagnosis not present

## 2013-02-24 DIAGNOSIS — C210 Malignant neoplasm of anus, unspecified: Secondary | ICD-10-CM | POA: Diagnosis not present

## 2013-02-24 DIAGNOSIS — E860 Dehydration: Secondary | ICD-10-CM | POA: Diagnosis not present

## 2013-02-24 NOTE — Telephone Encounter (Signed)
Feeling well and PICC line/site is normal. Slept well. Using saline rinses for mouth-reviewed mouth care with her and that stomatits is more likely after the pump is discontinued. No nausea, but has been taking the Compazine every 6 hours ATC. Made her aware this is only prn-suggested she decrease to q 8 hours, then q 12 then prn due to potential side effects of this drug. She has no questions or concerns.

## 2013-02-24 NOTE — Progress Notes (Addendum)
Patient education done, gave my business card, a sitz bath and radiation book, discusses skin irritation, fatigue,n,v,d, bladder changes, pain, , nutrition, drink plenty water, may need imodium ad prn if diarrhea occurs, eat plenty protiein, will see MD weekly and prn, verbal understanding, also spoke with daughter , teach back given  2:57 PM

## 2013-02-25 ENCOUNTER — Ambulatory Visit
Admission: RE | Admit: 2013-02-25 | Discharge: 2013-02-25 | Disposition: A | Discharge status: Home / Self Care | Payer: Medicare Other | Source: Ambulatory Visit | Attending: Radiation Oncology | Admitting: Radiation Oncology

## 2013-02-25 ENCOUNTER — Telehealth: Payer: Self-pay | Admitting: *Deleted

## 2013-02-25 DIAGNOSIS — Z510 Encounter for antineoplastic radiation therapy: Secondary | ICD-10-CM | POA: Diagnosis not present

## 2013-02-25 DIAGNOSIS — E860 Dehydration: Secondary | ICD-10-CM | POA: Diagnosis not present

## 2013-02-25 DIAGNOSIS — K6289 Other specified diseases of anus and rectum: Secondary | ICD-10-CM | POA: Diagnosis not present

## 2013-02-25 DIAGNOSIS — C210 Malignant neoplasm of anus, unspecified: Secondary | ICD-10-CM | POA: Diagnosis not present

## 2013-02-25 DIAGNOSIS — R197 Diarrhea, unspecified: Secondary | ICD-10-CM | POA: Diagnosis not present

## 2013-02-25 NOTE — Telephone Encounter (Signed)
Received message from pt earlier stating "my throat hurts; is there something I can take"  Attempted to call pt X2 no answer, no way to leave message.  Pt in for Rad Onc treatment and walked into lobby asking to talk with RN.  Spoke with pt in lobby; she states, "I woke up this morning with a sore throat; can eat/drink without problems; but when I cough it feels like my throat is tearing up inside" Denies fever, n/v/diarrhea; denies having any mouth sores.  Discussed with Lonna Cobb, NP.  Instructed pt per NP; pt can take tylenol, or Motrin for pain; push fluids; pt  states she is using biotene and gargles with salt water as needed.  Encouraged pt she is doing all the right things.  Instructed her to monitor temp if 100.5 or greater; have shaking chills, develops mouth sores, etc to call office immediately.  Pt verbalized understanding and confirmed pump d/c Friday @ 2pm

## 2013-02-26 ENCOUNTER — Ambulatory Visit
Admission: RE | Admit: 2013-02-26 | Discharge: 2013-02-26 | Disposition: A | Discharge status: Home / Self Care | Payer: Medicare Other | Source: Ambulatory Visit | Attending: Radiation Oncology | Admitting: Radiation Oncology

## 2013-02-26 ENCOUNTER — Ambulatory Visit (HOSPITAL_BASED_OUTPATIENT_CLINIC_OR_DEPARTMENT_OTHER): Payer: Medicare Other | Admitting: Nurse Practitioner

## 2013-02-26 ENCOUNTER — Telehealth: Payer: Self-pay | Admitting: *Deleted

## 2013-02-26 ENCOUNTER — Telehealth: Payer: Self-pay | Admitting: Nurse Practitioner

## 2013-02-26 ENCOUNTER — Encounter: Payer: Self-pay | Admitting: *Deleted

## 2013-02-26 VITALS — BP 154/56 | HR 95 | Temp 97.0°F | Resp 20 | Ht 62.75 in | Wt 104.0 lb

## 2013-02-26 DIAGNOSIS — K612 Anorectal abscess: Secondary | ICD-10-CM | POA: Diagnosis not present

## 2013-02-26 DIAGNOSIS — C801 Malignant (primary) neoplasm, unspecified: Secondary | ICD-10-CM

## 2013-02-26 DIAGNOSIS — K209 Esophagitis, unspecified without bleeding: Secondary | ICD-10-CM | POA: Diagnosis not present

## 2013-02-26 DIAGNOSIS — Z510 Encounter for antineoplastic radiation therapy: Secondary | ICD-10-CM | POA: Diagnosis not present

## 2013-02-26 DIAGNOSIS — J449 Chronic obstructive pulmonary disease, unspecified: Secondary | ICD-10-CM | POA: Diagnosis not present

## 2013-02-26 DIAGNOSIS — C210 Malignant neoplasm of anus, unspecified: Secondary | ICD-10-CM | POA: Diagnosis not present

## 2013-02-26 DIAGNOSIS — K6289 Other specified diseases of anus and rectum: Secondary | ICD-10-CM | POA: Diagnosis not present

## 2013-02-26 DIAGNOSIS — C20 Malignant neoplasm of rectum: Secondary | ICD-10-CM

## 2013-02-26 DIAGNOSIS — J4489 Other specified chronic obstructive pulmonary disease: Secondary | ICD-10-CM

## 2013-02-26 DIAGNOSIS — R197 Diarrhea, unspecified: Secondary | ICD-10-CM | POA: Diagnosis not present

## 2013-02-26 DIAGNOSIS — E860 Dehydration: Secondary | ICD-10-CM | POA: Diagnosis not present

## 2013-02-26 NOTE — Progress Notes (Signed)
[facility] Clinical Social Work  Clinical Social Work met with Pt after her radiation to reassess concerns due to previous high distress screen. Pt reports to be coping well, here alone as a family member had surgery. Pt reports no new emotional concerns, eager to speak with RN due to sore throat. CSW inquired about other concerns, Pt denies and is able to get here for her daily radiation treatments. Pt appreciated meeting CSW and agrees to phone with additional concerns.    Doreen Salvage, LCSW Clinical Social Worker [facility] Wednesday, Thursday and Friday Phone: [phone] Fax: [phone]

## 2013-02-26 NOTE — Progress Notes (Signed)
OFFICE PROGRESS NOTE  Interval history:  Ms. 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 She began cycle 1 5-FU/mitomycin C. on 02/23/2013. She is seen today in an unscheduled visit for evaluation of a sore throat.  She reports noting a sore throat yesterday when she coughed. Her throat was not sore with swallowing. She is eating and drinking without difficulty. She does not have throat pain today. She denies any mouth sores. No nausea or vomiting. She continues to have loose stools. She is taking Imodium as needed. She continues to note rectal bleeding.   Objective: Blood pressure 154/56, pulse 95, temperature 97 F (36.1 C), temperature source Oral, resp. rate 20, height 5' 2.75" (1.594 m), weight 104 lb (47.174 kg).  Oropharynx is without thrush or ulceration. Posterior pharynx is without erythema or exudate. Lungs are clear. Regular cardiac rhythm. Abdomen soft and nontender. No leg edema. PICC site is without erythema.  Lab Results: Lab Results  Component Value Date   WBC 5.5 02/17/2013   HGB 13.2 02/17/2013   HCT 40.8 02/17/2013   MCV 91.7 02/17/2013   PLT 256 02/17/2013    Chemistry:    Chemistry      Component Value Date/Time   NA 141 02/23/2013 1224   K 3.4* 02/23/2013 1224   CO2 29 02/23/2013 1224   BUN 11.2 02/23/2013 1224   BUN 14 02/03/2013 1257   CREATININE 0.6 02/23/2013 1224   CREATININE 0.62 02/03/2013 1257      Component Value Date/Time   CALCIUM 8.9 02/23/2013 1224   ALKPHOS 46 02/17/2013 1106   AST 15 02/17/2013 1106   ALT 10 02/17/2013 1106   BILITOT 0.54 02/17/2013 1106       Studies/Results: Ct Chest W Contrast  02/05/2013   CLINICAL DATA:  Anal carcinoma. Pre chemotherapy and radiation therapy. Evaluate for metastatic disease.  EXAM: CT CHEST WITH CONTRAST  TECHNIQUE: Multidetector CT imaging of the chest was performed during intravenous contrast administration.  CONTRAST:  80mL OMNIPAQUE IOHEXOL 300  MG/ML  SOLN  COMPARISON:  PET 01/2013.  FINDINGS: No pathologically enlarged mediastinal, hilar or axillary lymph nodes. Atherosclerotic calcification of the arterial vasculature, including three-vessel involvement of the coronary arteries. Heart size normal. No pericardial effusion. Small hiatal hernia.  Biapical pleural parenchymal scarring. Severe centrilobular emphysema. Mild scattered pulmonary parenchymal scarring. No pleural fluid. Airway is unremarkable.  Incidental imaging of the upper abdomen shows no acute findings. No worrisome lytic or sclerotic lesions.  IMPRESSION: 1. No evidence of metastatic disease in the chest. 2. Three-vessel coronary artery calcification. 3. Severe centrilobular emphysema.   Electronically Signed   By: Leanna Battles M.D.   On: 02/05/2013 15:53   Nm Pet Image Initial (pi) Skull Base To Thigh  02/05/2013   CLINICAL DATA:  Initial treatment strategy for anal carcinoma.  EXAM: NUCLEAR MEDICINE PET SKULL BASE TO THIGH  FASTING BLOOD GLUCOSE:  Value: 93mg /dl  TECHNIQUE: 16.1 mCi W-96 FDG was injected intravenously. CT data was obtained and used for attenuation correction and anatomic localization only. (This was not acquired as a diagnostic CT examination.) Additional exam technical data entered on technologist worksheet.  COMPARISON:  CT chest 02/05/2013 and CT abdomen pelvis 01/22/2013.  FINDINGS: NECK  No hypermetabolic lymph nodes in the neck. CT images show no acute findings. A retention cyst or polyp is seen in the left maxillary sinus. Paranasal sinuses and mastoid air cells are otherwise clear.  CHEST  No hypermetabolic mediastinal or  hilar lymph nodes. No hypermetabolic pulmonary nodules. CT images show fairly extensive atherosclerotic calcification of the arterial vasculature, including three-vessel involvement of the coronary arteries. No pericardial effusion. Small hiatal hernia. Severe centrilobular emphysema. No pleural fluid.  ABDOMEN/PELVIS  Eccentric soft tissue  thickening in the anal rectal region has an SUV max of 17.0. No additional areas of abnormal hypermetabolism in the abdomen or pelvis. No hypermetabolic lymph nodes.  CT images show the liver, gallbladder, adrenal glands, kidneys, spleen, pancreas, stomach and bowel to be otherwise grossly unremarkable. No pathologically enlarged lymph nodes. No free fluid. Hysterectomy. Atherosclerotic calcification of the arterial vasculature without abdominal aortic aneurysm.  SKELETON  No focal hypermetabolic activity to suggest skeletal metastasis.  IMPRESSION: 1. Hypermetabolic anorectal mass, consistent with the given history of anal carcinoma. No evidence of metastatic disease. 2. Three-vessel coronary artery calcification. 3. Severe centrilobular emphysema.   Electronically Signed   By: Leanna Battles M.D.   On: 02/05/2013 15:59   Ir Fluoro Guide Cv Line Right  02/20/2013   CLINICAL DATA:  Anal cancer, for chemotherapy  EXAM: IR RIGHT FLOURO GUIDE CV LINE; IR ULTRASOUND GUIDANCE VASC ACCESS RIGHT  TECHNIQUE: The right arm was prepped with chlorhexidine, draped in the usual sterile fashion using maximum barrier technique (cap and mask, sterile gown, sterile gloves, large sterile sheet, hand hygiene and cutaneous antiseptic). Local anesthesia was attained by infiltration with 1% lidocaine.  Ultrasound demonstrated patency of the right basilic vein, and this was documented with an image. Under real-time ultrasound guidance, this vein was accessed with a 21 gauge micropuncture needle and image documentation was performed. The needle was exchanged over a guidewire for a peel-away sheath through which a 38 cm 5 [place] dual lumen power injectable PICC was advanced, and positioned with its tip at the lower SVC/right atrial junction. Fluoroscopy during the procedure and fluoro spot radiograph confirms appropriate catheter position. The catheter was flushed, secured to the skin with Prolene sutures, and covered with a sterile  dressing.  FLUOROSCOPY TIME:  Twelve seconds .  COMPLICATIONS: None.  The patient tolerated the procedure well.  IMPRESSION: Successful placement of a right arm PICC with sonographic and fluoroscopic guidance. The catheter is ready for use.  Read by: Brayton El PA-C   Electronically Signed   By: Irish Lack M.D.   On: 02/20/2013 13:19   Ir US Guide Vasc Access Right  02/20/2013   CLINICAL DATA:  Anal cancer, for chemotherapy  EXAM: IR RIGHT FLOURO GUIDE CV LINE; IR ULTRASOUND GUIDANCE VASC ACCESS RIGHT  TECHNIQUE: The right arm was prepped with chlorhexidine, draped in the usual sterile fashion using maximum barrier technique (cap and mask, sterile gown, sterile gloves, large sterile sheet, hand hygiene and cutaneous antiseptic). Local anesthesia was attained by infiltration with 1% lidocaine.  Ultrasound demonstrated patency of the right basilic vein, and this was documented with an image. Under real-time ultrasound guidance, this vein was accessed with a 21 gauge micropuncture needle and image documentation was performed. The needle was exchanged over a guidewire for a peel-away sheath through which a 38 cm 5 [place] dual lumen power injectable PICC was advanced, and positioned with its tip at the lower SVC/right atrial junction. Fluoroscopy during the procedure and fluoro spot radiograph confirms appropriate catheter position. The catheter was flushed, secured to the skin with Prolene sutures, and covered with a sterile dressing.  FLUOROSCOPY TIME:  Twelve seconds .  COMPLICATIONS: None.  The patient tolerated the procedure well.  IMPRESSION: Successful placement of a  right arm PICC with sonographic and fluoroscopic guidance. The catheter is ready for use.  Read by: Brayton El PA-C   Electronically Signed   By: Irish Lack M.D.   On: 02/20/2013 13:19    Medications: I have reviewed the patient's current medications.  Assessment/Plan:  1. Squamous cell carcinoma of the distal rectum/anal  canal, early stage disease based on staging evaluation to date. Initiation of radiation and cycle 1 5-FU/mitomycin C. 02/23/2013. 2. CT evidence of a perirectal abscess. 3. COPD. 4. "Sore throat". Resolved.  Disposition-the sore throat she was experiencing has resolved. She will contact the office with recurrence of the sore throat or development of mouth sores. Otherwise we will see her as scheduled in one week.  Lonna Cobb ANP/GNP-BC

## 2013-02-26 NOTE — Telephone Encounter (Signed)
Called patient and informed her that Ebbie Ridge. Maisie Fus, NP, would like to see her today 02/26/13 at 2:45 after radiation treatment.  Per Ebbie Ridge. Maisie Fus, NP.  Patient verbalized understanding.

## 2013-02-26 NOTE — Telephone Encounter (Signed)
added appt for today at 245p w Misty Stanley per note from Nurse Chanda OK to double book shh

## 2013-02-27 ENCOUNTER — Ambulatory Visit
Admission: RE | Admit: 2013-02-27 | Discharge: 2013-02-27 | Disposition: A | Discharge status: Home / Self Care | Payer: Medicare Other | Source: Ambulatory Visit | Attending: Radiation Oncology | Admitting: Radiation Oncology

## 2013-02-27 ENCOUNTER — Other Ambulatory Visit: Payer: Self-pay | Admitting: *Deleted

## 2013-02-27 ENCOUNTER — Ambulatory Visit (HOSPITAL_BASED_OUTPATIENT_CLINIC_OR_DEPARTMENT_OTHER): Payer: Medicare Other

## 2013-02-27 ENCOUNTER — Ambulatory Visit: Payer: Medicare Other

## 2013-02-27 VITALS — BP 140/73 | HR 86 | Temp 98.5°F

## 2013-02-27 VITALS — BP 157/56 | HR 89 | Temp 97.5°F | Wt 101.7 lb

## 2013-02-27 DIAGNOSIS — C210 Malignant neoplasm of anus, unspecified: Secondary | ICD-10-CM

## 2013-02-27 DIAGNOSIS — R197 Diarrhea, unspecified: Secondary | ICD-10-CM | POA: Diagnosis not present

## 2013-02-27 DIAGNOSIS — Z510 Encounter for antineoplastic radiation therapy: Secondary | ICD-10-CM | POA: Diagnosis not present

## 2013-02-27 DIAGNOSIS — K6289 Other specified diseases of anus and rectum: Secondary | ICD-10-CM | POA: Diagnosis not present

## 2013-02-27 DIAGNOSIS — E860 Dehydration: Secondary | ICD-10-CM | POA: Diagnosis not present

## 2013-02-27 NOTE — Patient Instructions (Signed)
Peripherally Inserted Central Catheter (PICC) Removal and Care After A peripherally inserted catheter (PICC) is removed when it is no longer needed, when it is clotted, or when it may be infected.  PROCEDURE  The removal of a PICC is usually painless. Removing the tape that holds the PICC in place may be the most discomfort you have.  A physicians order needs to be obtained to have the PICC removed.  A PICC can be removed in the hospital or in an outpatient setting.  Never remove or take out the PICC yourself. Only a trained clinical professional, such as a PICC nurse, should remove the PICC.  If a PICC is suspected to be infected, the PICC tip is sent to the lab for culture. HOME CARE INSTRUCTIONS  When the PICC is out, pressure is applied at the insertion site to prevent bleeding. An antibiotic ointment may be applied to the insertion site. A dry, sterile gauze is then taped over the insertion site. This dressing should stay on for 24 hours.  After the 24 hours is up, the dressing may be removed. The PICC insertion site is very small. A small scab may develop over the insertion site. It is okay to wash the site gently with soap and water. Be careful to not remove or pick the scab off. After washing, gently pat the site dry. You do not need to put another dressing over the insertion site after you wash it.  Avoid heavy, strenuous physical activity for 24 hours after the PICC is removed. This includes things like:  Weight lifting.  Strenuous yard work.  Any physical activity with repetitive arm movement. SEEK MEDICAL CARE IF:  Call or see your caregiver as soon as possible if you develop the following conditions in the arm in which the PICC was inserted:  Swelling or puffiness.  Increasing tenderness or pain. SEEK IMMEDIATE MEDICAL CARE IF:  You develop any of the following conditions in the arm that had the PICC:  Numbness or tingling in your fingers, hand, or arm.  You arm has  a bluish color and it is cold to the touch.  Redness around the insertion site or a red-streak that goes up your arm.  Any type of drainage from the PICC insertion site. This includes drainage such as:  Bleeding from the insertion site. (If this happens, apply firm, direct pressure to the PICC insertion site with a clean towel.)  Drainage that is yellow or tan in color.  You have an oral temperature above 102 F (38.9 C), not controlled by medicine. Document Released: 08/23/2009 Document Revised: 05/28/2011 Document Reviewed: 08/23/2009 [employer] Patient Information 2014 [employer].  

## 2013-02-27 NOTE — Progress Notes (Signed)
  Radiation Oncology         [phone] ________________________________  Name: Sara Cochran MRN: [id number]  Date: 02/11/2013  DOB: [birth year]  SIMULATION AND TREATMENT PLANNING NOTE   CONSENT VERIFIED: yes   SET UP: Patient is set-up supine   IMMOBILIZATION: The following immobilization is used: alpha-cradle. This complex treatment device will be used on a daily basis during the patient's treatment.   Diagnosis: Anal cancer   NARRATIVE: The patient was brought to the CT Simulation planning suite. Identity was confirmed. All relevant records and images related to the planned course of therapy were reviewed. Then, the patient was positioned in a stable reproducible clinical set-up for radiation therapy using an aquaplast mask. Skin markings were placed. The CT images were loaded into the planning software where the target and avoidance structures were contoured.The radiation prescription was entered and confirmed.   The patient will receive 50.4 Gy in 28 fractions to the high-dose target region.  Daily image guidance is ordered, and this will be used on a daily basis. This is necessary to ensure accurate and precise localization of the target in addition to accurate alignment of the normal tissue structures in this region. This is particularly important given the possible motion of the high-dose target.  Treatment planning then occurred.   I have requested : Intensity Modulated Radiotherapy (IMRT) is medically necessary for this case for the following reason: Dose homogeneity; the target is in close proximity to critical normal structures, including the femoral heads, bladder, and small bowel. IMRT is thus medically to appropriately treat the patient.   Special treatment procedure  The patient will receive chemotherapy during the course of radiation treatment. The patient may experience increased or overlapping toxicity due to this combined-modality approach and the patient will be  monitored for such problems. This may include extra lab work as necessary. This therefore constitutes a special treatment procedure.     ________________________________  Radene Gunning, MD, PhD

## 2013-02-27 NOTE — Progress Notes (Signed)
PICC line removed 38 cm long. Pressure applied for 5 minutes post PICC removal. Patient tolerated well. Patient observed for 30 minutes post PICC removal. Dressing is clean, dry and intact. Patient verbalized understanding of discharge instructions.

## 2013-02-27 NOTE — Progress Notes (Signed)
Patient for weekly assessment of radiation of pelvis(anal ca)Had picc line removed today.cycle one of chemotherapy completed.No concerns voiced today regarding nausea/vomiting or pain.takes imodium for loose stools.

## 2013-02-27 NOTE — Progress Notes (Signed)
   Department of Radiation Oncology  Phone:  [phone] Fax:        [phone]  Weekly Treatment Note    Name: Sara Cochran Date: 02/27/2013 MRN: [id number] DOB: 04/16/1933   Current dose: 9 Gy  Current fraction: 5   MEDICATIONS: Current Outpatient Prescriptions  Medication Sig Dispense Refill  . albuterol (PROVENTIL HFA;VENTOLIN HFA) 108 (90 BASE) MCG/ACT inhaler Inhale 2 puffs into the lungs every 6 (six) hours as needed.      . ALPRAZolam (XANAX) 1 MG tablet Take 1 mg by mouth at bedtime as needed.      Marland Kitchen amLODipine (NORVASC) 5 MG tablet Take 5 mg by mouth daily.       Marland Kitchen estradiol (ESTRACE) 2 MG tablet Take 2 mg by mouth daily.       Marland Kitchen HYDROcodone-acetaminophen (LORCET) 10-650 MG per tablet Take 1 tablet by mouth every 6 (six) hours as needed.      Marland Kitchen ipratropium-albuterol (DUONEB) 0.5-2.5 (3) MG/3ML SOLN Take 3 mLs by nebulization.      . lidocaine (XYLOCAINE) 5 % ointment Apply 1 application topically as needed.      Marland Kitchen olmesartan (BENICAR) 40 MG tablet Take 40 mg by mouth daily.      Marland Kitchen omeprazole (PRILOSEC) 20 MG capsule Take 20 mg by mouth daily.      . Phenazopyridine HCl (AZO TABS PO) Take by mouth as needed.       . pravastatin (PRAVACHOL) 40 MG tablet Take 40 mg by mouth daily.      . prochlorperazine (COMPAZINE) 5 MG tablet Take 1 tablet (5 mg total) by mouth every 6 (six) hours as needed for nausea or vomiting.  30 tablet  1   No current facility-administered medications for this encounter.     ALLERGIES: Aspirin; Erythromycin; and Niacin and related   LABORATORY DATA:  Lab Results  Component Value Date   WBC 5.5 02/17/2013   HGB 13.2 02/17/2013   HCT 40.8 02/17/2013   MCV 91.7 02/17/2013   PLT 256 02/17/2013   Lab Results  Component Value Date   NA 141 02/23/2013   K 3.4* 02/23/2013   CO2 29 02/23/2013   Lab Results  Component Value Date   ALT 10 02/17/2013   AST 15 02/17/2013   ALKPHOS 46 02/17/2013   BILITOT 0.54 02/17/2013     NARRATIVE: Sara Charity  Cochran was seen today for weekly treatment management. The chart was checked and the patient's films were reviewed. The patient states that she has been doing fine with her first week of treatment. She has had some loose stools and is taking Imodium.  PHYSICAL EXAMINATION: weight is 101 lb 11.2 oz (46.131 kg). Her temperature is 97.5 F (36.4 C). Her blood pressure is 157/56 and her pulse is 89. Her oxygen saturation is 90%.        ASSESSMENT: The patient is doing satisfactorily with treatment.  PLAN: We will continue with the patient's radiation treatment as planned.

## 2013-03-02 ENCOUNTER — Ambulatory Visit
Admission: RE | Admit: 2013-03-02 | Discharge: 2013-03-02 | Disposition: A | Discharge status: Home / Self Care | Payer: Medicare Other | Source: Ambulatory Visit | Attending: Radiation Oncology | Admitting: Radiation Oncology

## 2013-03-02 ENCOUNTER — Ambulatory Visit: Payer: Medicare Other | Admitting: Nutrition

## 2013-03-02 DIAGNOSIS — R197 Diarrhea, unspecified: Secondary | ICD-10-CM | POA: Diagnosis not present

## 2013-03-02 DIAGNOSIS — C210 Malignant neoplasm of anus, unspecified: Secondary | ICD-10-CM | POA: Diagnosis not present

## 2013-03-02 DIAGNOSIS — E860 Dehydration: Secondary | ICD-10-CM | POA: Diagnosis not present

## 2013-03-02 DIAGNOSIS — Z510 Encounter for antineoplastic radiation therapy: Secondary | ICD-10-CM | POA: Diagnosis not present

## 2013-03-02 DIAGNOSIS — K6289 Other specified diseases of anus and rectum: Secondary | ICD-10-CM | POA: Diagnosis not present

## 2013-03-02 NOTE — Progress Notes (Signed)
Nutrition followup completed with patient and stepdaughter.  Patient reports she has no appetite.  She tries to eat.  Weight has been stable and was documented as 101.7 pounds December 12.  Patient reports she cannot afford to drink 2 Ensure Plus a day.  Patient reports diarrhea, which is improved with Imodium.  Nutrition diagnosis: Unintended weight loss improved.  Intervention: Patient educated to continue oral nutrition supplements twice a day.  I provided patient with one complementary case of Ensure Plus along with other oral nutrition supplement samples.  Patient educated to consume small, frequent meals and snacks throughout the day.  Questions were answered.  Teach back method used.  Monitoring, evaluation, goals: Patient will tolerate adequate calories and protein along with oral nutrition supplements twice a day to minimize weight loss.  Next visit: Patient will contact me if she needs additional nutrition supplements.

## 2013-03-03 ENCOUNTER — Telehealth: Payer: Self-pay | Admitting: *Deleted

## 2013-03-03 ENCOUNTER — Ambulatory Visit
Admission: RE | Admit: 2013-03-03 | Discharge: 2013-03-03 | Disposition: A | Discharge status: Home / Self Care | Payer: Medicare Other | Source: Ambulatory Visit | Attending: Radiation Oncology | Admitting: Radiation Oncology

## 2013-03-03 DIAGNOSIS — Z510 Encounter for antineoplastic radiation therapy: Secondary | ICD-10-CM | POA: Diagnosis not present

## 2013-03-03 DIAGNOSIS — E860 Dehydration: Secondary | ICD-10-CM | POA: Diagnosis not present

## 2013-03-03 DIAGNOSIS — K6289 Other specified diseases of anus and rectum: Secondary | ICD-10-CM | POA: Diagnosis not present

## 2013-03-03 DIAGNOSIS — C210 Malignant neoplasm of anus, unspecified: Secondary | ICD-10-CM | POA: Diagnosis not present

## 2013-03-03 DIAGNOSIS — R197 Diarrhea, unspecified: Secondary | ICD-10-CM | POA: Diagnosis not present

## 2013-03-03 NOTE — Telephone Encounter (Signed)
Called patient home,clarified imodium ad for her can take up to 8 tabs in a 24 hour period, can take an imodium after each diarrhea up to total 8 tabs daily, patient gave verbal understanding 3:43 PM

## 2013-03-04 ENCOUNTER — Ambulatory Visit
Admission: RE | Admit: 2013-03-04 | Discharge: 2013-03-04 | Disposition: A | Discharge status: Home / Self Care | Payer: Medicare Other | Source: Ambulatory Visit | Attending: Radiation Oncology | Admitting: Radiation Oncology

## 2013-03-04 DIAGNOSIS — K6289 Other specified diseases of anus and rectum: Secondary | ICD-10-CM | POA: Diagnosis not present

## 2013-03-04 DIAGNOSIS — E860 Dehydration: Secondary | ICD-10-CM | POA: Diagnosis not present

## 2013-03-04 DIAGNOSIS — R197 Diarrhea, unspecified: Secondary | ICD-10-CM | POA: Diagnosis not present

## 2013-03-04 DIAGNOSIS — Z510 Encounter for antineoplastic radiation therapy: Secondary | ICD-10-CM | POA: Diagnosis not present

## 2013-03-04 DIAGNOSIS — C210 Malignant neoplasm of anus, unspecified: Secondary | ICD-10-CM | POA: Diagnosis not present

## 2013-03-05 ENCOUNTER — Ambulatory Visit (HOSPITAL_BASED_OUTPATIENT_CLINIC_OR_DEPARTMENT_OTHER): Payer: Medicare Other | Admitting: Nurse Practitioner

## 2013-03-05 ENCOUNTER — Telehealth: Payer: Self-pay | Admitting: Oncology

## 2013-03-05 ENCOUNTER — Other Ambulatory Visit (HOSPITAL_BASED_OUTPATIENT_CLINIC_OR_DEPARTMENT_OTHER): Payer: Medicare Other

## 2013-03-05 ENCOUNTER — Ambulatory Visit
Admission: RE | Admit: 2013-03-05 | Discharge: 2013-03-05 | Disposition: A | Discharge status: Home / Self Care | Payer: Medicare Other | Source: Ambulatory Visit | Attending: Radiation Oncology | Admitting: Radiation Oncology

## 2013-03-05 ENCOUNTER — Telehealth: Payer: Self-pay | Admitting: *Deleted

## 2013-03-05 VITALS — BP 132/52 | HR 105 | Temp 98.1°F | Resp 17 | Ht 62.0 in | Wt 98.8 lb

## 2013-03-05 DIAGNOSIS — E860 Dehydration: Secondary | ICD-10-CM | POA: Diagnosis not present

## 2013-03-05 DIAGNOSIS — C20 Malignant neoplasm of rectum: Secondary | ICD-10-CM

## 2013-03-05 DIAGNOSIS — C210 Malignant neoplasm of anus, unspecified: Secondary | ICD-10-CM

## 2013-03-05 DIAGNOSIS — D6959 Other secondary thrombocytopenia: Secondary | ICD-10-CM

## 2013-03-05 DIAGNOSIS — Z510 Encounter for antineoplastic radiation therapy: Secondary | ICD-10-CM | POA: Diagnosis not present

## 2013-03-05 DIAGNOSIS — J449 Chronic obstructive pulmonary disease, unspecified: Secondary | ICD-10-CM

## 2013-03-05 DIAGNOSIS — R197 Diarrhea, unspecified: Secondary | ICD-10-CM | POA: Diagnosis not present

## 2013-03-05 DIAGNOSIS — K6289 Other specified diseases of anus and rectum: Secondary | ICD-10-CM | POA: Diagnosis not present

## 2013-03-05 LAB — BASIC METABOLIC PANEL (CC13)
Anion Gap: 9 mEq/L (ref 3–11)
BUN: 19.1 mg/dL (ref 7.0–26.0)
Chloride: 104 mEq/L (ref 98–109)
Creatinine: 0.7 mg/dL (ref 0.6–1.1)
Glucose: 130 mg/dl (ref 70–140)

## 2013-03-05 LAB — CBC WITH DIFFERENTIAL/PLATELET
Basophils Absolute: 0 10*3/uL (ref 0.0–0.1)
EOS%: 0.4 % (ref 0.0–7.0)
Eosinophils Absolute: 0 10*3/uL (ref 0.0–0.5)
HCT: 37.4 % (ref 34.8–46.6)
HGB: 12.3 g/dL (ref 11.6–15.9)
LYMPH%: 14.3 % (ref 14.0–49.7)
MCH: 29.4 pg (ref 25.1–34.0)
MCV: 89.5 fL (ref 79.5–101.0)
MONO%: 7.3 % (ref 0.0–14.0)
NEUT#: 3.6 10*3/uL (ref 1.5–6.5)
NEUT%: 77.9 % — ABNORMAL HIGH (ref 38.4–76.8)

## 2013-03-05 LAB — TECHNOLOGIST REVIEW

## 2013-03-05 NOTE — Telephone Encounter (Signed)
gave pt appt for lab,MD and ML for janaury 2015 emailed [facility] regarding chemo

## 2013-03-05 NOTE — Progress Notes (Signed)
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 She completed cycle 1 5-FU/mitomycin C. beginning 02/23/2013.   She had mild nausea for a few days after the chemotherapy. She denies any mouth sores. She has noted a few sores along the lower lip. The diarrhea she was experiencing prior to treatment is better. She continues to have frequent bowel movements. She thinks the bleeding is less. The rectal pain she was experiencing prior to initiation of treatment is better. She has discomfort at the perineum. She coughs periodically. No shortness of breath or fever.   Objective: Blood pressure 132/52, pulse 105, temperature 98.1 F (36.7 C), temperature source Oral, resp. rate 17, height 5\' 2"  (1.575 m), weight 98 lb 12.8 oz (44.815 kg).  No thrush or ulcerations. Breath sounds are distant. Regular cardiac rhythm. Abdomen is soft and nontender. No hepatomegaly. Extremities without edema. Perineum/perianal region is erythematous. No skin breakdown.  Lab Results: Lab Results  Component Value Date   WBC 4.6 03/05/2013   HGB 12.3 03/05/2013   HCT 37.4 03/05/2013   MCV 89.5 03/05/2013   PLT 94* 03/05/2013    Chemistry:    Chemistry      Component Value Date/Time   NA 140 03/05/2013 1155   K 3.9 03/05/2013 1155   CO2 27 03/05/2013 1155   BUN 19.1 03/05/2013 1155   BUN 14 02/03/2013 1257   CREATININE 0.7 03/05/2013 1155   CREATININE 0.62 02/03/2013 1257      Component Value Date/Time   CALCIUM 9.1 03/05/2013 1155   ALKPHOS 46 02/17/2013 1106   AST 15 02/17/2013 1106   ALT 10 02/17/2013 1106   BILITOT 0.54 02/17/2013 1106       Studies/Results: Ct Chest W Contrast  02/05/2013   CLINICAL DATA:  Anal carcinoma. Pre chemotherapy and radiation therapy. Evaluate for metastatic disease.  EXAM: CT CHEST WITH CONTRAST  TECHNIQUE: Multidetector CT imaging of the chest was performed during  intravenous contrast administration.  CONTRAST:  80mL OMNIPAQUE IOHEXOL 300 MG/ML  SOLN  COMPARISON:  PET 01/2013.  FINDINGS: No pathologically enlarged mediastinal, hilar or axillary lymph nodes. Atherosclerotic calcification of the arterial vasculature, including three-vessel involvement of the coronary arteries. Heart size normal. No pericardial effusion. Small hiatal hernia.  Biapical pleural parenchymal scarring. Severe centrilobular emphysema. Mild scattered pulmonary parenchymal scarring. No pleural fluid. Airway is unremarkable.  Incidental imaging of the upper abdomen shows no acute findings. No worrisome lytic or sclerotic lesions.  IMPRESSION: 1. No evidence of metastatic disease in the chest. 2. Three-vessel coronary artery calcification. 3. Severe centrilobular emphysema.   Electronically Signed   By: Leanna Battles M.D.   On: 02/05/2013 15:53   Nm Pet Image Initial (pi) Skull Base To Thigh  02/05/2013   CLINICAL DATA:  Initial treatment strategy for anal carcinoma.  EXAM: NUCLEAR MEDICINE PET SKULL BASE TO THIGH  FASTING BLOOD GLUCOSE:  Value: 93mg /dl  TECHNIQUE: 16.1 mCi W-96 FDG was injected intravenously. CT data was obtained and used for attenuation correction and anatomic localization only. (This was not acquired as a diagnostic CT examination.) Additional exam technical data entered on technologist worksheet.  COMPARISON:  CT chest 02/05/2013 and CT abdomen pelvis 01/22/2013.  FINDINGS: NECK  No hypermetabolic lymph nodes in the neck. CT images show no acute findings. A retention cyst or polyp is seen in the left maxillary sinus. Paranasal sinuses and mastoid air cells are otherwise clear.  CHEST  No hypermetabolic mediastinal or hilar lymph nodes. No hypermetabolic pulmonary nodules. CT images show fairly extensive atherosclerotic calcification of the arterial vasculature, including three-vessel involvement of the coronary arteries. No pericardial effusion. Small hiatal hernia. Severe  centrilobular emphysema. No pleural fluid.  ABDOMEN/PELVIS  Eccentric soft tissue thickening in the anal rectal region has an SUV max of 17.0. No additional areas of abnormal hypermetabolism in the abdomen or pelvis. No hypermetabolic lymph nodes.  CT images show the liver, gallbladder, adrenal glands, kidneys, spleen, pancreas, stomach and bowel to be otherwise grossly unremarkable. No pathologically enlarged lymph nodes. No free fluid. Hysterectomy. Atherosclerotic calcification of the arterial vasculature without abdominal aortic aneurysm.  SKELETON  No focal hypermetabolic activity to suggest skeletal metastasis.  IMPRESSION: 1. Hypermetabolic anorectal mass, consistent with the given history of anal carcinoma. No evidence of metastatic disease. 2. Three-vessel coronary artery calcification. 3. Severe centrilobular emphysema.   Electronically Signed   By: Leanna Battles M.D.   On: 02/05/2013 15:59   Ir Fluoro Guide Cv Line Right  02/20/2013   CLINICAL DATA:  Anal cancer, for chemotherapy  EXAM: IR RIGHT FLOURO GUIDE CV LINE; IR ULTRASOUND GUIDANCE VASC ACCESS RIGHT  TECHNIQUE: The right arm was prepped with chlorhexidine, draped in the usual sterile fashion using maximum barrier technique (cap and mask, sterile gown, sterile gloves, large sterile sheet, hand hygiene and cutaneous antiseptic). Local anesthesia was attained by infiltration with 1% lidocaine.  Ultrasound demonstrated patency of the right basilic vein, and this was documented with an image. Under real-time ultrasound guidance, this vein was accessed with a 21 gauge micropuncture needle and image documentation was performed. The needle was exchanged over a guidewire for a peel-away sheath through which a 38 cm 5 [place] dual lumen power injectable PICC was advanced, and positioned with its tip at the lower SVC/right atrial junction. Fluoroscopy during the procedure and fluoro spot radiograph confirms appropriate catheter position. The catheter was  flushed, secured to the skin with Prolene sutures, and covered with a sterile dressing.  FLUOROSCOPY TIME:  Twelve seconds .  COMPLICATIONS: None.  The patient tolerated the procedure well.  IMPRESSION: Successful placement of a right arm PICC with sonographic and fluoroscopic guidance. The catheter is ready for use.  Read by: Brayton El PA-C   Electronically Signed   By: Irish Lack M.D.   On: 02/20/2013 13:19   Ir US Guide Vasc Access Right  02/20/2013   CLINICAL DATA:  Anal cancer, for chemotherapy  EXAM: IR RIGHT FLOURO GUIDE CV LINE; IR ULTRASOUND GUIDANCE VASC ACCESS RIGHT  TECHNIQUE: The right arm was prepped with chlorhexidine, draped in the usual sterile fashion using maximum barrier technique (cap and mask, sterile gown, sterile gloves, large sterile sheet, hand hygiene and cutaneous antiseptic). Local anesthesia was attained by infiltration with 1% lidocaine.  Ultrasound demonstrated patency of the right basilic vein, and this was documented with an image. Under real-time ultrasound guidance, this vein was accessed with a 21 gauge micropuncture needle and image documentation was performed. The needle was exchanged over a guidewire for a peel-away sheath through which a 38 cm 5 [place] dual lumen power injectable PICC was advanced, and positioned with its tip at the lower SVC/right atrial junction. Fluoroscopy during the procedure and fluoro spot radiograph confirms appropriate catheter position. The catheter was flushed, secured to the skin with Prolene sutures, and covered with a sterile dressing.  FLUOROSCOPY TIME:  Twelve seconds .  COMPLICATIONS: None.  The patient tolerated the procedure well.  IMPRESSION: Successful placement of a right arm PICC with sonographic and fluoroscopic guidance. The catheter is ready for use.  Read by: Brayton El PA-C   Electronically Signed   By: Irish Lack M.D.   On: 02/20/2013 13:19    Medications: I have reviewed the patient's current  medications.  Assessment/Plan:  1. 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 Initiation of radiation and cycle 1 5-FU/mitomycin C. 02/23/2013. 2. CT evidence of a perirectal abscess. 3. COPD. 4. "Sore throat" 02/26/2013. Resolved. 5. Thrombocytopenia secondary to chemotherapy.  Disposition-she appears stable. We will obtain a followup CBC on 03/10/2013. She will return for a followup visit and cycle 2 5-FU/mitomycin C. on 03/23/2013. We will arrange for PICC placement prior to chemotherapy on 03/23/2013. She will contact the office prior to her next visit with any problems.  Plan reviewed with Dr. Truett Perna.  Lonna Cobb ANP/GNP-BC

## 2013-03-05 NOTE — Telephone Encounter (Signed)
Per staff message and POF I have scheduled appts.  JMW  

## 2013-03-05 NOTE — Progress Notes (Signed)
Sara Cochran was asking if she needed any cream for her rectal area.  She said it is red but not broken down.  Advised her that she will see Dr. Mitzi Hansen tomorrow and that he would decide tomorrow.  Advised her to use her sitz bath with warm water for relief.

## 2013-03-06 ENCOUNTER — Ambulatory Visit: Payer: Medicare Other

## 2013-03-06 ENCOUNTER — Ambulatory Visit: Payer: Medicare Other | Admitting: Radiation Oncology

## 2013-03-09 ENCOUNTER — Encounter: Payer: Self-pay | Admitting: Radiation Oncology

## 2013-03-09 ENCOUNTER — Telehealth: Payer: Self-pay | Admitting: Oncology

## 2013-03-09 ENCOUNTER — Ambulatory Visit
Admission: RE | Admit: 2013-03-09 | Discharge: 2013-03-09 | Disposition: A | Discharge status: Home / Self Care | Payer: Medicare Other | Source: Ambulatory Visit | Attending: Radiation Oncology | Admitting: Radiation Oncology

## 2013-03-09 ENCOUNTER — Ambulatory Visit (HOSPITAL_COMMUNITY): Payer: Medicare Other

## 2013-03-09 VITALS — BP 100/70 | HR 100 | Temp 98.0°F | Resp 20 | Wt 98.8 lb

## 2013-03-09 DIAGNOSIS — C210 Malignant neoplasm of anus, unspecified: Secondary | ICD-10-CM | POA: Diagnosis not present

## 2013-03-09 DIAGNOSIS — R197 Diarrhea, unspecified: Secondary | ICD-10-CM | POA: Diagnosis not present

## 2013-03-09 DIAGNOSIS — E860 Dehydration: Secondary | ICD-10-CM | POA: Diagnosis not present

## 2013-03-09 DIAGNOSIS — Z510 Encounter for antineoplastic radiation therapy: Secondary | ICD-10-CM | POA: Diagnosis not present

## 2013-03-09 DIAGNOSIS — K6289 Other specified diseases of anus and rectum: Secondary | ICD-10-CM | POA: Diagnosis not present

## 2013-03-09 NOTE — Telephone Encounter (Signed)
Talked to pt and gave her appt for lab,md and chemo on 03/23/13

## 2013-03-09 NOTE — Progress Notes (Signed)
Weekly rad txs, anal, 10/28 completed,missed Friday "Having too many diarrhes's", taking imodium after each episode, has slowed down a bit, believes boost is contributing factor of diarrhea,will stop, drinking boost, will try milkshakes with pnut butter , pain 5-6 on 1-10 pain scale, checked bottom, red but skin intact, using baby wipes,bottle water rinses, sitz baths, fatigued, 3 lb wt.loss 3:04 PM

## 2013-03-09 NOTE — Telephone Encounter (Signed)
pt wanted port placement on same day as 1st chemo per LT ok...done

## 2013-03-09 NOTE — Progress Notes (Signed)
Weekly Management Note:  Site: Anal canal/pelvis Current Dose:  1800  cGy Projected Dose: 5040  cGy  Narrative: The patient is seen today for routine under treatment assessment. CBCT/MVCT images/port films were reviewed. The chart was reviewed.   She missed last Friday because of severe diarrhea. She is taking up to 3 Imodium tablets a day. She has not taken much fluid or nutrition today, faring diarrhea on her right down here from [place]. She does have perineal discomfort but no skin breakdown.  Physical Examination:  Filed Vitals:   03/09/13 1453  BP: 100/70  Pulse: 100  Temp: 98 F (36.7 C)  Resp: 20  .  Weight: 98 lb 12.8 oz (44.815 kg). Per the nursing note, her perineum is erythematous but there is no skin breakdown.  Laboratory data: Lab Results  Component Value Date   WBC 4.6 03/05/2013   HGB 12.3 03/05/2013   HCT 37.4 03/05/2013   MCV 89.5 03/05/2013   PLT 94* 03/05/2013     Impression: Tolerating radiation therapy well, although she does have some degree of dehydration based on her vital signs. I encouraged her to increase her by mouth fluid intake, and try to increase her caloric intake as well. She probably needs to take 3 Imodium a day as a baseline.  Plan: Continue radiation therapy as planned.

## 2013-03-10 ENCOUNTER — Ambulatory Visit
Admission: RE | Admit: 2013-03-10 | Discharge: 2013-03-10 | Disposition: A | Discharge status: Home / Self Care | Payer: Medicare Other | Source: Ambulatory Visit | Attending: Radiation Oncology | Admitting: Radiation Oncology

## 2013-03-10 DIAGNOSIS — E860 Dehydration: Secondary | ICD-10-CM | POA: Diagnosis not present

## 2013-03-10 DIAGNOSIS — K6289 Other specified diseases of anus and rectum: Secondary | ICD-10-CM | POA: Diagnosis not present

## 2013-03-10 DIAGNOSIS — Z510 Encounter for antineoplastic radiation therapy: Secondary | ICD-10-CM | POA: Diagnosis not present

## 2013-03-10 DIAGNOSIS — R197 Diarrhea, unspecified: Secondary | ICD-10-CM | POA: Diagnosis not present

## 2013-03-10 DIAGNOSIS — C210 Malignant neoplasm of anus, unspecified: Secondary | ICD-10-CM | POA: Diagnosis not present

## 2013-03-11 ENCOUNTER — Ambulatory Visit
Admission: RE | Admit: 2013-03-11 | Discharge: 2013-03-11 | Disposition: A | Discharge status: Home / Self Care | Payer: Medicare Other | Source: Ambulatory Visit | Attending: Radiation Oncology | Admitting: Radiation Oncology

## 2013-03-11 DIAGNOSIS — C210 Malignant neoplasm of anus, unspecified: Secondary | ICD-10-CM | POA: Diagnosis not present

## 2013-03-11 DIAGNOSIS — R197 Diarrhea, unspecified: Secondary | ICD-10-CM | POA: Diagnosis not present

## 2013-03-11 DIAGNOSIS — K6289 Other specified diseases of anus and rectum: Secondary | ICD-10-CM | POA: Diagnosis not present

## 2013-03-11 DIAGNOSIS — Z510 Encounter for antineoplastic radiation therapy: Secondary | ICD-10-CM | POA: Diagnosis not present

## 2013-03-11 DIAGNOSIS — E860 Dehydration: Secondary | ICD-10-CM | POA: Diagnosis not present

## 2013-03-13 ENCOUNTER — Ambulatory Visit
Admission: RE | Admit: 2013-03-13 | Discharge: 2013-03-13 | Disposition: A | Discharge status: Home / Self Care | Payer: Medicare Other | Source: Ambulatory Visit | Attending: Radiation Oncology | Admitting: Radiation Oncology

## 2013-03-13 ENCOUNTER — Encounter: Payer: Self-pay | Admitting: Radiation Oncology

## 2013-03-13 VITALS — BP 115/64 | HR 96 | Temp 97.9°F | Resp 20 | Wt 100.0 lb

## 2013-03-13 DIAGNOSIS — C210 Malignant neoplasm of anus, unspecified: Secondary | ICD-10-CM

## 2013-03-13 DIAGNOSIS — Z510 Encounter for antineoplastic radiation therapy: Secondary | ICD-10-CM | POA: Diagnosis not present

## 2013-03-13 DIAGNOSIS — E860 Dehydration: Secondary | ICD-10-CM | POA: Diagnosis not present

## 2013-03-13 DIAGNOSIS — R197 Diarrhea, unspecified: Secondary | ICD-10-CM | POA: Diagnosis not present

## 2013-03-13 DIAGNOSIS — K6289 Other specified diseases of anus and rectum: Secondary | ICD-10-CM | POA: Diagnosis not present

## 2013-03-13 LAB — CBC WITH DIFFERENTIAL/PLATELET
BASO%: 0.2 % (ref 0.0–2.0)
Basophils Absolute: 0 10*3/uL (ref 0.0–0.1)
EOS%: 0.7 % (ref 0.0–7.0)
HCT: 35.8 % (ref 34.8–46.6)
HGB: 11.8 g/dL (ref 11.6–15.9)
MCH: 29.6 pg (ref 25.1–34.0)
MCHC: 32.9 g/dL (ref 31.5–36.0)
MCV: 89.9 fL (ref 79.5–101.0)
MONO%: 13.5 % (ref 0.0–14.0)
NEUT%: 68.8 % (ref 38.4–76.8)
RBC: 3.98 10*6/uL (ref 3.70–5.45)
lymph#: 0.4 10*3/uL — ABNORMAL LOW (ref 0.9–3.3)

## 2013-03-13 NOTE — Progress Notes (Signed)
Weekly rad txs anal 13 completed, takes 2 imodium every morning when gettig up, has had 3 loose stools so far this am, appetite better gained 2 lbs back, pain a 4 on 1-10 scale, c/o sore bottom, sitz baths 2 daily 2:54 PM

## 2013-03-13 NOTE — Progress Notes (Signed)
  Radiation Oncology         [phone]) [phone] ________________________________  Name: Sara Cochran  MRN: [id number]  Date: 03/13/2013  DOB: 24-Jun-1933  Weekly Radiation Therapy Management  Current Dose: 23.4 Gy     Planned Dose:  50.4 Gy  Narrative . . . . . . . . The patient presents for routine under treatment assessment. She takes 2 imodium every morning when gettig up, has had 3 loose stools so far this am, appetite better gained 2 lbs back, pain a 4 on 1-10 scale, c/o sore bottom, sitz baths 2 daily                                   The patient is without complaint.                                 Set-up films were reviewed.                                 The chart was checked. Physical Findings. . .  weight is 100 lb (45.36 kg). Her oral temperature is 97.9 F (36.6 C). Her blood pressure is 115/64 and her pulse is 96. Her respiration is 20. . Weight essentially stable.  No significant changes. Impression . . . . . . . The patient is tolerating radiation. Plan . . . . . . . . . . . . Continue treatment as planned.  I do not see a weekly CBC result, and given the elevated risk for neutropenia with Mitomycin C and pelvic marrow irradiation, I ordered CBC with diff today.  ________________________________  Artist Pais Kathrynn Running, M.D.    Addendum:  The patient is somewhat neutropenic, but not requiring interruption of XRT.   Results for GEORGEANN, BRINKMAN (MRN [id number]) as of 03/13/2013 18:26   Ref. Range 03/13/2013 15:29  NEUT# Latest Range: 1.5-6.5 10e3/uL 1.7   CBC    Component Value Date/Time   WBC 2.4* 03/13/2013 1529   RBC 3.98 03/13/2013 1529   HGB 11.8 03/13/2013 1529   HCT 35.8 03/13/2013 1529   PLT 146 03/13/2013 1529   MCV 89.9 03/13/2013 1529   MCH 29.6 03/13/2013 1529   MCHC 32.9 03/13/2013 1529   RDW 12.3 03/13/2013 1529   LYMPHSABS 0.4* 03/13/2013 1529   MONOABS 0.3 03/13/2013 1529   EOSABS 0.0 03/13/2013 1529   BASOSABS 0.0 03/13/2013 1529

## 2013-03-16 ENCOUNTER — Ambulatory Visit
Admission: RE | Admit: 2013-03-16 | Discharge: 2013-03-16 | Disposition: A | Discharge status: Home / Self Care | Payer: Medicare Other | Source: Ambulatory Visit | Attending: Radiation Oncology | Admitting: Radiation Oncology

## 2013-03-16 DIAGNOSIS — K6289 Other specified diseases of anus and rectum: Secondary | ICD-10-CM | POA: Diagnosis not present

## 2013-03-16 DIAGNOSIS — R197 Diarrhea, unspecified: Secondary | ICD-10-CM | POA: Diagnosis not present

## 2013-03-16 DIAGNOSIS — Z510 Encounter for antineoplastic radiation therapy: Secondary | ICD-10-CM | POA: Diagnosis not present

## 2013-03-16 DIAGNOSIS — C210 Malignant neoplasm of anus, unspecified: Secondary | ICD-10-CM | POA: Diagnosis not present

## 2013-03-16 DIAGNOSIS — E860 Dehydration: Secondary | ICD-10-CM | POA: Diagnosis not present

## 2013-03-17 ENCOUNTER — Ambulatory Visit
Admission: RE | Admit: 2013-03-17 | Discharge: 2013-03-17 | Disposition: A | Discharge status: Home / Self Care | Payer: Medicare Other | Source: Ambulatory Visit | Attending: Radiation Oncology | Admitting: Radiation Oncology

## 2013-03-17 DIAGNOSIS — E860 Dehydration: Secondary | ICD-10-CM | POA: Diagnosis not present

## 2013-03-17 DIAGNOSIS — Z510 Encounter for antineoplastic radiation therapy: Secondary | ICD-10-CM | POA: Diagnosis not present

## 2013-03-17 DIAGNOSIS — C210 Malignant neoplasm of anus, unspecified: Secondary | ICD-10-CM | POA: Diagnosis not present

## 2013-03-17 DIAGNOSIS — R197 Diarrhea, unspecified: Secondary | ICD-10-CM | POA: Diagnosis not present

## 2013-03-17 DIAGNOSIS — K6289 Other specified diseases of anus and rectum: Secondary | ICD-10-CM | POA: Diagnosis not present

## 2013-03-18 ENCOUNTER — Ambulatory Visit
Admission: RE | Admit: 2013-03-18 | Discharge: 2013-03-18 | Disposition: A | Discharge status: Home / Self Care | Payer: Medicare Other | Source: Ambulatory Visit | Attending: Radiation Oncology | Admitting: Radiation Oncology

## 2013-03-18 DIAGNOSIS — E860 Dehydration: Secondary | ICD-10-CM | POA: Diagnosis not present

## 2013-03-18 DIAGNOSIS — Z510 Encounter for antineoplastic radiation therapy: Secondary | ICD-10-CM | POA: Diagnosis not present

## 2013-03-18 DIAGNOSIS — R197 Diarrhea, unspecified: Secondary | ICD-10-CM | POA: Diagnosis not present

## 2013-03-18 DIAGNOSIS — K6289 Other specified diseases of anus and rectum: Secondary | ICD-10-CM | POA: Diagnosis not present

## 2013-03-18 DIAGNOSIS — C210 Malignant neoplasm of anus, unspecified: Secondary | ICD-10-CM | POA: Diagnosis not present

## 2013-03-20 ENCOUNTER — Ambulatory Visit
Admission: RE | Admit: 2013-03-20 | Discharge: 2013-03-20 | Disposition: A | Discharge status: Home / Self Care | Payer: Medicare Other | Source: Ambulatory Visit | Attending: Radiation Oncology | Admitting: Radiation Oncology

## 2013-03-20 VITALS — BP 107/53 | HR 87 | Temp 97.4°F | Wt 103.6 lb

## 2013-03-20 DIAGNOSIS — Z79899 Other long term (current) drug therapy: Secondary | ICD-10-CM | POA: Diagnosis not present

## 2013-03-20 DIAGNOSIS — Z510 Encounter for antineoplastic radiation therapy: Secondary | ICD-10-CM | POA: Diagnosis not present

## 2013-03-20 DIAGNOSIS — R197 Diarrhea, unspecified: Secondary | ICD-10-CM | POA: Diagnosis not present

## 2013-03-20 DIAGNOSIS — C210 Malignant neoplasm of anus, unspecified: Secondary | ICD-10-CM

## 2013-03-20 DIAGNOSIS — E860 Dehydration: Secondary | ICD-10-CM | POA: Diagnosis not present

## 2013-03-20 DIAGNOSIS — K6289 Other specified diseases of anus and rectum: Secondary | ICD-10-CM | POA: Diagnosis not present

## 2013-03-20 NOTE — Progress Notes (Signed)
   Department of Radiation Oncology  Phone:  [phone] Fax:        [phone]  Weekly Treatment Note    Name: Sara Cochran Date: 03/20/2013 MRN: [id number] DOB: Sep 05, 1933   Current dose: 30.6 Gy  Current fraction: 17   MEDICATIONS: Current Outpatient Prescriptions  Medication Sig Dispense Refill  . albuterol (PROVENTIL HFA;VENTOLIN HFA) 108 (90 BASE) MCG/ACT inhaler Inhale 2 puffs into the lungs every 6 (six) hours as needed.      . ALPRAZolam (XANAX) 1 MG tablet Take 1 mg by mouth at bedtime as needed.      Marland Kitchen amLODipine (NORVASC) 5 MG tablet Take 5 mg by mouth daily.       Marland Kitchen HYDROcodone-acetaminophen (LORCET) 10-650 MG per tablet Take 1 tablet by mouth every 6 (six) hours as needed.      Marland Kitchen ipratropium-albuterol (DUONEB) 0.5-2.5 (3) MG/3ML SOLN Take 3 mLs by nebulization.      Marland Kitchen olmesartan (BENICAR) 40 MG tablet Take 40 mg by mouth daily.      Marland Kitchen omeprazole (PRILOSEC) 20 MG capsule Take 20 mg by mouth daily.      . pravastatin (PRAVACHOL) 40 MG tablet Take 40 mg by mouth daily.      . prochlorperazine (COMPAZINE) 5 MG tablet Take 1 tablet (5 mg total) by mouth every 6 (six) hours as needed for nausea or vomiting.  30 tablet  1  . estradiol (ESTRACE) 2 MG tablet Take 2 mg by mouth daily.       Marland Kitchen lidocaine (XYLOCAINE) 5 % ointment Apply 1 application topically as needed.       No current facility-administered medications for this encounter.     ALLERGIES: Aspirin; Erythromycin; and Niacin and related   LABORATORY DATA:  Lab Results  Component Value Date   WBC 2.4* 03/13/2013   HGB 11.8 03/13/2013   HCT 35.8 03/13/2013   MCV 89.9 03/13/2013   PLT 146 03/13/2013   Lab Results  Component Value Date   NA 140 03/05/2013   K 3.9 03/05/2013   CO2 27 03/05/2013   Lab Results  Component Value Date   ALT 10 02/17/2013   AST 15 02/17/2013   ALKPHOS 46 02/17/2013   BILITOT 0.54 02/17/2013     NARRATIVE: Sara Cochran was seen today for weekly treatment management.  The chart was checked and the patient's films were reviewed. The patient is doing relatively well at this time. She is having some occasional diarrhea, especially in the morning. She is taking Imodium in the morning and occasionally during the day.  PHYSICAL EXAMINATION: weight is 103 lb 9.6 oz (46.993 kg). Her temperature is 97.4 F (36.3 C). Her blood pressure is 107/53 and her pulse is 87.        ASSESSMENT: The patient is doing satisfactorily with treatment.  PLAN: We will continue with the patient's radiation treatment as planned. The patient will begin taking one tablet of Imodium at night and didn't continue her regular regimen of 2 tablets in the morning.

## 2013-03-20 NOTE — Progress Notes (Signed)
Patient for weekly assessment of radiation to pelvis for anal cancer.Complted 17 of 28 treatments.Has intermittent diarhea with normal stools.Takes imodium ad.Will have picc line inserted on Monday 03/23/2013 and resume 5-fu.Denies nausea or pain at present, shortness of breath on-going with emphysema.Has some vaginal burning with urination.using sitz bath.

## 2013-03-23 ENCOUNTER — Ambulatory Visit
Admission: RE | Admit: 2013-03-23 | Discharge: 2013-03-23 | Disposition: A | Discharge status: Home / Self Care | Payer: Medicare Other | Source: Ambulatory Visit | Attending: Radiation Oncology | Admitting: Radiation Oncology

## 2013-03-23 ENCOUNTER — Ambulatory Visit (HOSPITAL_COMMUNITY)
Admission: RE | Admit: 2013-03-23 | Discharge: 2013-03-23 | Disposition: A | Discharge status: Home / Self Care | Payer: Medicare Other | Source: Ambulatory Visit | Attending: Nurse Practitioner | Admitting: Nurse Practitioner

## 2013-03-23 ENCOUNTER — Ambulatory Visit (HOSPITAL_BASED_OUTPATIENT_CLINIC_OR_DEPARTMENT_OTHER): Payer: Medicare Other | Admitting: Nurse Practitioner

## 2013-03-23 ENCOUNTER — Telehealth: Payer: Self-pay | Admitting: Oncology

## 2013-03-23 ENCOUNTER — Other Ambulatory Visit (HOSPITAL_BASED_OUTPATIENT_CLINIC_OR_DEPARTMENT_OTHER): Payer: Medicare Other

## 2013-03-23 ENCOUNTER — Ambulatory Visit (HOSPITAL_BASED_OUTPATIENT_CLINIC_OR_DEPARTMENT_OTHER): Payer: Medicare Other

## 2013-03-23 ENCOUNTER — Other Ambulatory Visit: Payer: Self-pay | Admitting: Nurse Practitioner

## 2013-03-23 VITALS — BP 109/59 | HR 94 | Temp 96.8°F | Resp 19 | Ht 62.0 in | Wt 101.1 lb

## 2013-03-23 DIAGNOSIS — E876 Hypokalemia: Secondary | ICD-10-CM | POA: Diagnosis not present

## 2013-03-23 DIAGNOSIS — C20 Malignant neoplasm of rectum: Secondary | ICD-10-CM | POA: Diagnosis not present

## 2013-03-23 DIAGNOSIS — J449 Chronic obstructive pulmonary disease, unspecified: Secondary | ICD-10-CM

## 2013-03-23 DIAGNOSIS — D696 Thrombocytopenia, unspecified: Secondary | ICD-10-CM

## 2013-03-23 DIAGNOSIS — C210 Malignant neoplasm of anus, unspecified: Secondary | ICD-10-CM | POA: Diagnosis not present

## 2013-03-23 DIAGNOSIS — Z452 Encounter for adjustment and management of vascular access device: Secondary | ICD-10-CM | POA: Diagnosis not present

## 2013-03-23 DIAGNOSIS — Z510 Encounter for antineoplastic radiation therapy: Secondary | ICD-10-CM | POA: Diagnosis not present

## 2013-03-23 DIAGNOSIS — K6289 Other specified diseases of anus and rectum: Secondary | ICD-10-CM

## 2013-03-23 DIAGNOSIS — E860 Dehydration: Secondary | ICD-10-CM | POA: Diagnosis not present

## 2013-03-23 DIAGNOSIS — Z5111 Encounter for antineoplastic chemotherapy: Secondary | ICD-10-CM | POA: Diagnosis not present

## 2013-03-23 DIAGNOSIS — R197 Diarrhea, unspecified: Secondary | ICD-10-CM | POA: Diagnosis not present

## 2013-03-23 LAB — COMPREHENSIVE METABOLIC PANEL (CC13)
ALBUMIN: 2.8 g/dL — AB (ref 3.5–5.0)
ALT: 7 U/L (ref 0–55)
AST: 12 U/L (ref 5–34)
Alkaline Phosphatase: 45 U/L (ref 40–150)
Anion Gap: 11 mEq/L (ref 3–11)
BILIRUBIN TOTAL: 0.22 mg/dL (ref 0.20–1.20)
BUN: 11.1 mg/dL (ref 7.0–26.0)
CO2: 29 mEq/L (ref 22–29)
Calcium: 8.3 mg/dL — ABNORMAL LOW (ref 8.4–10.4)
Chloride: 101 mEq/L (ref 98–109)
Creatinine: 0.6 mg/dL (ref 0.6–1.1)
GLUCOSE: 119 mg/dL (ref 70–140)
Potassium: 3.3 mEq/L — ABNORMAL LOW (ref 3.5–5.1)
Sodium: 141 mEq/L (ref 136–145)
Total Protein: 5.9 g/dL — ABNORMAL LOW (ref 6.4–8.3)

## 2013-03-23 LAB — CBC WITH DIFFERENTIAL/PLATELET
BASO%: 0.5 % (ref 0.0–2.0)
BASOS ABS: 0 10*3/uL (ref 0.0–0.1)
EOS ABS: 0.2 10*3/uL (ref 0.0–0.5)
EOS%: 4.7 % (ref 0.0–7.0)
HCT: 34.3 % — ABNORMAL LOW (ref 34.8–46.6)
HEMOGLOBIN: 11.4 g/dL — AB (ref 11.6–15.9)
LYMPH%: 20.9 % (ref 14.0–49.7)
MCH: 30 pg (ref 25.1–34.0)
MCHC: 33.3 g/dL (ref 31.5–36.0)
MCV: 90 fL (ref 79.5–101.0)
MONO#: 0.4 10*3/uL (ref 0.1–0.9)
MONO%: 10.1 % (ref 0.0–14.0)
NEUT#: 2.6 10*3/uL (ref 1.5–6.5)
NEUT%: 63.8 % (ref 38.4–76.8)
PLATELETS: 304 10*3/uL (ref 145–400)
RBC: 3.81 10*6/uL (ref 3.70–5.45)
RDW: 13.4 % (ref 11.2–14.5)
WBC: 4.1 10*3/uL (ref 3.9–10.3)
lymph#: 0.9 10*3/uL (ref 0.9–3.3)

## 2013-03-23 MED ORDER — LIDOCAINE HCL 1 % IJ SOLN
INTRAMUSCULAR | Status: AC
  Filled 2013-03-23: qty 20

## 2013-03-23 MED ORDER — MITOMYCIN CHEMO IV INJECTION 20 MG
8.0000 mg/m2 | Freq: Once | INTRAVENOUS | Status: AC
  Administered 2013-03-23: 11.5 mg via INTRAVENOUS
  Filled 2013-03-23: qty 23

## 2013-03-23 MED ORDER — FLUOROURACIL CHEMO INJECTION 5 GM/100ML
800.0000 mg/m2/d | INTRAVENOUS | Status: DC
  Administered 2013-03-23: 4600 mg via INTRAVENOUS
  Filled 2013-03-23: qty 92

## 2013-03-23 MED ORDER — DEXAMETHASONE SODIUM PHOSPHATE 20 MG/5ML IJ SOLN
INTRAMUSCULAR | Status: AC
  Filled 2013-03-23: qty 5

## 2013-03-23 MED ORDER — SODIUM CHLORIDE 0.9 % IV SOLN
Freq: Once | INTRAVENOUS | Status: AC
  Administered 2013-03-23: 13:00:00 via INTRAVENOUS

## 2013-03-23 MED ORDER — ONDANSETRON 8 MG/NS 50 ML IVPB
INTRAVENOUS | Status: AC
  Filled 2013-03-23: qty 8

## 2013-03-23 MED ORDER — ONDANSETRON 8 MG/50ML IVPB (CHCC)
8.0000 mg | Freq: Once | INTRAVENOUS | Status: AC
  Administered 2013-03-23: 8 mg via INTRAVENOUS

## 2013-03-23 MED ORDER — POTASSIUM CHLORIDE CRYS ER 20 MEQ PO TBCR: 20.0000 meq | EXTENDED_RELEASE_TABLET | Freq: Every day | ORAL | Status: DC

## 2013-03-23 MED ORDER — DEXAMETHASONE SODIUM PHOSPHATE 10 MG/ML IJ SOLN
10.0000 mg | Freq: Once | INTRAMUSCULAR | Status: AC
  Administered 2013-03-23: 10 mg via INTRAVENOUS

## 2013-03-23 MED ORDER — DEXAMETHASONE SODIUM PHOSPHATE 10 MG/ML IJ SOLN
INTRAMUSCULAR | Status: AC
  Filled 2013-03-23: qty 1

## 2013-03-23 NOTE — Telephone Encounter (Signed)
GV AND PRINTED APPT SCHED FOR Sara Cochran

## 2013-03-23 NOTE — Progress Notes (Signed)
OFFICE PROGRESS NOTE  Interval history:   Ms. 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 She began radiation on 02/23/2013. She completed cycle 1 5-FU/mitomycin C. beginning 02/23/2013. She is seen today for scheduled followup.  She denies mouth sores. No nausea or vomiting. Good appetite. She continues to have intermittent loose stools. She periodically has formed stools. She has pain at the rectum. She takes Vicodin as needed.   Objective: Filed Vitals:   03/23/13 1127  BP: 109/59  Pulse: 94  Temp: 96.8 F (36 C)  Resp: 19     No thrush or ulcerations. Diminished breath sounds. Lungs clear. Regular cardiac rhythm. Abdomen soft and nontender. No hepatomegaly. Erythema at the perineum/perianal region with superficial skin breakdown.  Lab Results: Lab Results  Component Value Date   WBC 4.1 03/23/2013   HGB 11.4* 03/23/2013   HCT 34.3* 03/23/2013   MCV 90.0 03/23/2013   PLT 304 03/23/2013   NEUTROABS 2.6 03/23/2013    Chemistry:    Chemistry      Component Value Date/Time   NA 141 03/23/2013 1112   K 3.3* 03/23/2013 1112   CO2 29 03/23/2013 1112   BUN 11.1 03/23/2013 1112   BUN 14 02/03/2013 1257   CREATININE 0.6 03/23/2013 1112   CREATININE 0.62 02/03/2013 1257      Component Value Date/Time   CALCIUM 8.3* 03/23/2013 1112   ALKPHOS 45 03/23/2013 1112   AST 12 03/23/2013 1112   ALT 7 03/23/2013 1112   BILITOT 0.22 03/23/2013 1112       Studies/Results: Ir Fluoro Guide Cv Line Right  03/23/2013   CLINICAL DATA:  History of anal cancer, PICC line placement for chemotherapy.  EXAM: IR RIGHT FLOURO GUIDE CV LINE; IR ULTRASOUND GUIDANCE VASC ACCESS RIGHT  TECHNIQUE: The right arm was prepped with chlorhexidine, draped in the usual sterile fashion using maximum barrier technique (cap and mask, sterile gown, sterile gloves, large sterile sheet, hand hygiene and cutaneous antiseptic). Local anesthesia was attained by infiltration with 1%  lidocaine.  Ultrasound demonstrated patency of the basilic vein, and this was documented with an image. Under real-time ultrasound guidance, this vein was accessed with a 21 gauge micropuncture needle and image documentation was performed. The needle was exchanged over a guidewire for a peel-away sheath through which a 38 cm 5 [place] single lumen power injectable PICC was advanced, and positioned with its tip at the lower SVC/right atrial junction. Fluoroscopy during the procedure and fluoro spot radiograph confirms appropriate catheter position. The catheter was flushed, secured to the skin with Prolene sutures, and covered with a sterile dressing.  FLUOROSCOPY TIME:  3 minutes, 36 seconds.  COMPLICATIONS: None.  The patient tolerated the procedure well.  IMPRESSION: Successful placement of a right arm PICC with sonographic and fluoroscopic guidance. The catheter is ready for use.  Read by: Ascencion Dike PA-C   Electronically Signed   By: Maryclare Bean M.D.   On: 03/23/2013 10:02   Ir US Guide Vasc Access Right  03/23/2013   CLINICAL DATA:  History of anal cancer, PICC line placement for chemotherapy.  EXAM: IR RIGHT FLOURO GUIDE CV LINE; IR ULTRASOUND GUIDANCE VASC ACCESS RIGHT  TECHNIQUE: The right arm was prepped with chlorhexidine, draped in the usual sterile fashion using maximum barrier technique (cap and mask, sterile gown, sterile gloves, large sterile sheet, hand hygiene and cutaneous antiseptic). Local anesthesia was attained by infiltration with 1% lidocaine.  Ultrasound demonstrated patency of  the basilic vein, and this was documented with an image. Under real-time ultrasound guidance, this vein was accessed with a 21 gauge micropuncture needle and image documentation was performed. The needle was exchanged over a guidewire for a peel-away sheath through which a 38 cm 5 [place] single lumen power injectable PICC was advanced, and positioned with its tip at the lower SVC/right atrial junction. Fluoroscopy  during the procedure and fluoro spot radiograph confirms appropriate catheter position. The catheter was flushed, secured to the skin with Prolene sutures, and covered with a sterile dressing.  FLUOROSCOPY TIME:  3 minutes, 36 seconds.  COMPLICATIONS: None.  The patient tolerated the procedure well.  IMPRESSION: Successful placement of a right arm PICC with sonographic and fluoroscopic guidance. The catheter is ready for use.  Read by: Ascencion Dike PA-C   Electronically Signed   By: Maryclare Bean M.D.   On: 03/23/2013 10:02    Medications: I have reviewed the patient's current medications.  Assessment/Plan:  1. 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 Initiation of radiation and cycle 1 5-FU/mitomycin C. 02/23/2013. 2. CT evidence of a perirectal abscess. 3. COPD. 4. "Sore throat" 02/26/2013. Resolved. 5. Mild thrombocytopenia following cycle 1 5-FU/mitomycin-C. 6. Hypokalemia 03/23/2013. The hypokalemia is likely related to loose stools. She will begin K-Dur 20 mEq daily. Repeat basic metabolic panel 28/31/5176    Dispositon-she appears stable. Plan to proceed with cycle 2 5-FU/mitomycin C. today as scheduled. We will plan to have the PICC line removed at time of pump discontinuation 03/27/2013. She will return for a followup visit with Dr. Benay Spice on 04/02/2013. She will contact the office in the interim with any problems.   Ned Card ANP/GNP-BC

## 2013-03-23 NOTE — Procedures (Signed)
Successful placement of single lumen PICC line to right basilic vein. Length 38cm Tip at lower SVC/RA No complications Ready for use.  Ascencion Dike PA-C Interventional Radiology 03/23/2013 9:42 AM

## 2013-03-23 NOTE — Patient Instructions (Signed)
[facility] Discharge Instructions for Patients Receiving Chemotherapy  Today you received the following chemotherapy agents Mitomycin and Adrucil.  To help prevent nausea and vomiting after your treatment, we encourage you to take your nausea medication.   If you develop nausea and vomiting that is not controlled by your nausea medication, call the clinic.   BELOW ARE SYMPTOMS THAT SHOULD BE REPORTED IMMEDIATELY:  *FEVER GREATER THAN 100.5 F  *CHILLS WITH OR WITHOUT FEVER  NAUSEA AND VOMITING THAT IS NOT CONTROLLED WITH YOUR NAUSEA MEDICATION  *UNUSUAL SHORTNESS OF BREATH  *UNUSUAL BRUISING OR BLEEDING  TENDERNESS IN MOUTH AND THROAT WITH OR WITHOUT PRESENCE OF ULCERS  *URINARY PROBLEMS  *BOWEL PROBLEMS  UNUSUAL RASH Items with * indicate a potential emergency and should be followed up as soon as possible.  Feel free to call the clinic you have any questions or concerns. The clinic phone number is [phone].

## 2013-03-23 NOTE — Progress Notes (Signed)
Patient asked for cream for her bottom, checked skin anal area, slight erythema, Dr.moody doesn't like to use cream in that area, unless skin is broken down moistness, patient aware, and will see Dr.moody Friday and will have him check her again 3:30 PM

## 2013-03-24 ENCOUNTER — Ambulatory Visit: Payer: Medicare Other

## 2013-03-24 ENCOUNTER — Ambulatory Visit
Admission: RE | Admit: 2013-03-24 | Discharge: 2013-03-24 | Disposition: A | Discharge status: Home / Self Care | Payer: Medicare Other | Source: Ambulatory Visit | Attending: Radiation Oncology | Admitting: Radiation Oncology

## 2013-03-24 VITALS — BP 99/64 | HR 94 | Temp 96.8°F

## 2013-03-24 DIAGNOSIS — Z510 Encounter for antineoplastic radiation therapy: Secondary | ICD-10-CM | POA: Diagnosis not present

## 2013-03-24 DIAGNOSIS — K6289 Other specified diseases of anus and rectum: Secondary | ICD-10-CM | POA: Diagnosis not present

## 2013-03-24 DIAGNOSIS — R197 Diarrhea, unspecified: Secondary | ICD-10-CM | POA: Diagnosis not present

## 2013-03-24 DIAGNOSIS — C210 Malignant neoplasm of anus, unspecified: Secondary | ICD-10-CM | POA: Diagnosis not present

## 2013-03-24 DIAGNOSIS — Z452 Encounter for adjustment and management of vascular access device: Secondary | ICD-10-CM

## 2013-03-24 DIAGNOSIS — E860 Dehydration: Secondary | ICD-10-CM | POA: Diagnosis not present

## 2013-03-24 MED ORDER — SODIUM CHLORIDE 0.9 % IJ SOLN
10.0000 mL | INTRAMUSCULAR | Status: DC | PRN
  Filled 2013-03-24: qty 10

## 2013-03-24 MED ORDER — HEPARIN SOD (PORK) LOCK FLUSH 100 UNIT/ML IV SOLN
500.0000 [IU] | Freq: Once | INTRAVENOUS | Status: DC
  Filled 2013-03-24: qty 5

## 2013-03-24 NOTE — Patient Instructions (Signed)
Peripherally Inserted Central Catheter (PICC) Home Guide A peripherally inserted central catheter (PICC) is a long, thin, flexible tube that is inserted into a vein in the upper arm. It is a form of intravenous (IV) access. It is considered to be a "central" line because the tip of the PICC ends in a large vein in your chest. This large vein is called the superior vena cava (SVC). The PICC tip ends in the SVC because there is a lot of blood flow in the SVC. This allows medicines and IV fluids to be quickly distributed throughout the body. The PICC is inserted using a sterile technique by a specially trained nurse or physician. After the PICC is inserted, a chest X-ray is done to be sure it is in the correct place.  A PICC may be placed for different reasons, such as:  To give medicines and liquid nutrition that can only be given through a central line. Examples are:  Certain antibiotic treatments.  Chemotherapy.  Total parenteral nutrition (TPN).  To take frequent blood samples.  To give IV fluids and blood products.  If there is difficulty placing a peripheral intravenous (PIV) catheter. If taken care of properly, a PICC can remain in place for several months. A PICC can also allow patients to go home early. Medicine and PICC care can be managed at home by a family member or home healthcare team. RISKS AND COMPLICATIONS Possible problems with a PICC can occasionally occur. This may include:  A clot (thrombus) forming in or at the tip of the PICC. This can cause the PICC to become clogged. A "clot-busting" medicine called tissue plasminogen activator (tPA) can be inserted into the PICC to help break up the clot.  Inflammation of the vein (phlebitis) in which the PICC is placed. Signs of inflammation may include redness, pain at the insertion site, red streaks, or being able to feel a "cord" in the vein where the PICC is located.  Infection in the PICC or at the insertion site. Signs of  infection may include fever, chills, redness, swelling, or pus drainage from the PICC insertion site.  PICC movement (malposition). The PICC tip may migrate from its original position due to excessive physical activity, forceful coughing, sneezing, or vomiting.  A break or cut in the PICC. It is important to not use scissors near the PICC.  Nerve or tendon irritation or injury during PICC insertion. HOME CARE INSTRUCTIONS Activity  You may bend your arm and move it freely. If your PICC is near or at the bend of your elbow, avoid activity with repeated motion at the elbow.  Avoid lifting heavy objects as instructed by your caregiver.  Avoid using a crutch with the arm on the same side as your PICC. You may need to use a walker. PICC Dressing  Keep your PICC bandage (dressing) clean and dry to prevent infection.  Ask your caregiver when you may shower. Ask your caregiver to teach you how to wrap the PICC when you do take a shower.  Do not bathe, swim, or use hot tubs when you have a PICC.  Change the PICC dressing as instructed by your caregiver.  Change your PICC dressing if it becomes loose or wet. General PICC Care  Check the PICC insertion site daily for leakage, redness, swelling, or pain.  Flush the PICC as directed by your caregiver. Let your caregiver know right away if the PICC is difficult to flush or does not flush. Do not use force   to flush the PICC.  Do not use a syringe that is less than 10 mLs to flush the PICC.  Never pull or tug on the PICC.  Avoid blood pressure checks on the arm with the PICC.  Keep your PICC identification card with you at all times.  Do not take the PICC out yourself. Only a trained clinical professional should remove the PICC. SEEK IMMEDIATE MEDICAL CARE IF:  Your PICC is accidently pulled all the way out. If this happens, cover the insertion site with a bandage or gauze dressing. Do not throw the PICC away. Your caregiver will need to  inspect it.  Your PICC was tugged or pulled and has partially come out. Do not  push the PICC back in.  There is any type of drainage, redness, or swelling where the PICC enters the skin.  You cannot flush the PICC, it is difficult to flush, or the PICC leaks around the insertion site when it is flushed.  You hear a "flushing" sound when the PICC is flushed.  You have pain, discomfort, or numbness in your arm, shoulder, or jaw on the same side as the PICC .  You feel your heart "racing" or skipping beats.  You notice a hole or tear in the PICC.  You develop chills or a fever. MAKE SURE YOU:   Understand these instructions.  Will watch your condition.  Will get help right away if you are not doing well or get worse. Document Released: 09/09/2002 Document Revised: 05/28/2011 Document Reviewed: 07/10/2010 Exit[employer] Patient Information 2014 [employer].  

## 2013-03-25 ENCOUNTER — Ambulatory Visit
Admission: RE | Admit: 2013-03-25 | Discharge: 2013-03-25 | Disposition: A | Discharge status: Home / Self Care | Payer: Medicare Other | Source: Ambulatory Visit | Attending: Radiation Oncology | Admitting: Radiation Oncology

## 2013-03-25 DIAGNOSIS — C210 Malignant neoplasm of anus, unspecified: Secondary | ICD-10-CM | POA: Diagnosis not present

## 2013-03-25 DIAGNOSIS — R197 Diarrhea, unspecified: Secondary | ICD-10-CM | POA: Diagnosis not present

## 2013-03-25 DIAGNOSIS — E860 Dehydration: Secondary | ICD-10-CM | POA: Diagnosis not present

## 2013-03-25 DIAGNOSIS — K6289 Other specified diseases of anus and rectum: Secondary | ICD-10-CM | POA: Diagnosis not present

## 2013-03-25 DIAGNOSIS — Z510 Encounter for antineoplastic radiation therapy: Secondary | ICD-10-CM | POA: Diagnosis not present

## 2013-03-26 ENCOUNTER — Ambulatory Visit
Admission: RE | Admit: 2013-03-26 | Discharge: 2013-03-26 | Disposition: A | Discharge status: Home / Self Care | Payer: Medicare Other | Source: Ambulatory Visit | Attending: Radiation Oncology | Admitting: Radiation Oncology

## 2013-03-26 DIAGNOSIS — E860 Dehydration: Secondary | ICD-10-CM | POA: Diagnosis not present

## 2013-03-26 DIAGNOSIS — K6289 Other specified diseases of anus and rectum: Secondary | ICD-10-CM | POA: Diagnosis not present

## 2013-03-26 DIAGNOSIS — C210 Malignant neoplasm of anus, unspecified: Secondary | ICD-10-CM | POA: Diagnosis not present

## 2013-03-26 DIAGNOSIS — Z510 Encounter for antineoplastic radiation therapy: Secondary | ICD-10-CM | POA: Diagnosis not present

## 2013-03-26 DIAGNOSIS — R197 Diarrhea, unspecified: Secondary | ICD-10-CM | POA: Diagnosis not present

## 2013-03-27 ENCOUNTER — Ambulatory Visit
Admission: RE | Admit: 2013-03-27 | Discharge: 2013-03-27 | Disposition: A | Discharge status: Home / Self Care | Payer: Medicare Other | Source: Ambulatory Visit | Attending: Radiation Oncology | Admitting: Radiation Oncology

## 2013-03-27 ENCOUNTER — Other Ambulatory Visit: Payer: Self-pay | Admitting: *Deleted

## 2013-03-27 ENCOUNTER — Encounter: Payer: Self-pay | Admitting: Radiation Oncology

## 2013-03-27 ENCOUNTER — Ambulatory Visit (HOSPITAL_BASED_OUTPATIENT_CLINIC_OR_DEPARTMENT_OTHER): Payer: Medicare Other

## 2013-03-27 ENCOUNTER — Ambulatory Visit: Payer: Medicare Other

## 2013-03-27 VITALS — BP 98/55 | HR 100 | Temp 97.8°F

## 2013-03-27 VITALS — BP 92/59 | HR 116 | Temp 97.5°F | Resp 20 | Wt 98.9 lb

## 2013-03-27 DIAGNOSIS — C210 Malignant neoplasm of anus, unspecified: Secondary | ICD-10-CM

## 2013-03-27 DIAGNOSIS — Z510 Encounter for antineoplastic radiation therapy: Secondary | ICD-10-CM | POA: Diagnosis not present

## 2013-03-27 DIAGNOSIS — R197 Diarrhea, unspecified: Secondary | ICD-10-CM | POA: Diagnosis not present

## 2013-03-27 DIAGNOSIS — C20 Malignant neoplasm of rectum: Secondary | ICD-10-CM

## 2013-03-27 DIAGNOSIS — E860 Dehydration: Secondary | ICD-10-CM | POA: Diagnosis not present

## 2013-03-27 DIAGNOSIS — K6289 Other specified diseases of anus and rectum: Secondary | ICD-10-CM | POA: Diagnosis not present

## 2013-03-27 MED ORDER — HEPARIN SOD (PORK) LOCK FLUSH 100 UNIT/ML IV SOLN
500.0000 [IU] | Freq: Once | INTRAVENOUS | Status: AC | PRN
  Administered 2013-03-27: 250 [IU]
  Filled 2013-03-27: qty 5

## 2013-03-27 MED ORDER — SODIUM CHLORIDE 0.9 % IJ SOLN
10.0000 mL | INTRAMUSCULAR | Status: DC | PRN
  Administered 2013-03-27: 10 mL
  Filled 2013-03-27: qty 10

## 2013-03-27 NOTE — Progress Notes (Signed)
Patient monitored x30 minutes post PICC removal. VSS, patient AAO, no complaints. Patient and family discharged to home in stable condition.

## 2013-03-27 NOTE — Progress Notes (Addendum)
Pt c/o rectal pain 5/10 and states "it is bearable". She states it is worse when she has BM and is "touching it". Pt taking Imodium 2 tabs every morning, 1 tab prn during day and 1 tab nightly. She states she had diarrhea over night but none today since 6 am. Pt has fatigue, loss of appetite. She also c/o "griping in my stomach". She states this causes loss of appetite. Pt has had steady decline in BP. Printed her flow sheet w/VS since Nov 2014. Pt states she will call her PCP today to inform and ask for instructions re: BP medications she takes.

## 2013-03-27 NOTE — Progress Notes (Signed)
   Department of Radiation Oncology  Phone:  [phone] Fax:        [phone]  Weekly Treatment Note    Name: Sara Cochran Date: 03/27/2013 MRN: [id number] DOB: August 20, 1933   Current dose: 37.8 Gy  Current fraction: 21   MEDICATIONS: Current Outpatient Prescriptions  Medication Sig Dispense Refill  . albuterol (PROVENTIL HFA;VENTOLIN HFA) 108 (90 BASE) MCG/ACT inhaler Inhale 2 puffs into the lungs every 6 (six) hours as needed.      . ALPRAZolam (XANAX) 1 MG tablet Take 1 mg by mouth at bedtime as needed.      Marland Kitchen amLODipine (NORVASC) 5 MG tablet Take 5 mg by mouth daily.       Marland Kitchen estradiol (ESTRACE) 2 MG tablet Take 2 mg by mouth daily.       Marland Kitchen HYDROcodone-acetaminophen (LORCET) 10-650 MG per tablet Take 1 tablet by mouth every 6 (six) hours as needed.      Marland Kitchen ipratropium-albuterol (DUONEB) 0.5-2.5 (3) MG/3ML SOLN Take 3 mLs by nebulization.      . lidocaine (XYLOCAINE) 5 % ointment Apply 1 application topically as needed.      Marland Kitchen olmesartan (BENICAR) 40 MG tablet Take 40 mg by mouth daily.      Marland Kitchen omeprazole (PRILOSEC) 20 MG capsule Take 20 mg by mouth daily.      . potassium chloride SA (K-DUR,KLOR-CON) 20 MEQ tablet Take 1 tablet (20 mEq total) by mouth daily.  30 tablet  0  . pravastatin (PRAVACHOL) 40 MG tablet Take 40 mg by mouth daily.      . prochlorperazine (COMPAZINE) 5 MG tablet Take 1 tablet (5 mg total) by mouth every 6 (six) hours as needed for nausea or vomiting.  30 tablet  1   No current facility-administered medications for this encounter.     ALLERGIES: Aspirin; Erythromycin; and Niacin and related   LABORATORY DATA:  Lab Results  Component Value Date   WBC 4.1 03/23/2013   HGB 11.4* 03/23/2013   HCT 34.3* 03/23/2013   MCV 90.0 03/23/2013   PLT 304 03/23/2013   Lab Results  Component Value Date   NA 141 03/23/2013   K 3.3* 03/23/2013   CO2 29 03/23/2013   Lab Results  Component Value Date   ALT 7 03/23/2013   AST 12 03/23/2013   ALKPHOS 45 03/23/2013   BILITOT  0.22 03/23/2013     NARRATIVE: Sara Cochran was seen today for weekly treatment management. The chart was checked and the patient's films were reviewed. The patient states that she does have some rectal pain but that this is reasonably well controlled currently. She does have pain medicine which is working well which she is taking 2 times per day.  PHYSICAL EXAMINATION: weight is 98 lb 14.4 oz (44.861 kg). Her oral temperature is 97.5 F (36.4 C). Her blood pressure is 92/59 and her pulse is 116. Her respiration is 20.      irritation with some moist desquamation in the anal region but the perianal region is holding up well.  ASSESSMENT: The patient is doing satisfactorily with treatment.  PLAN: We will continue with the patient's radiation treatment as planned.

## 2013-03-27 NOTE — Progress Notes (Signed)
1600- Right arm PICC removed, length of 38 cm. Pressure applied for 5 mins, educated on lying flat for 30 mins, no heavy lifting or bending when at home, and keep dressing in place for 24 hrs, no shower for 24 hrs. Patient and step-daughter both voice understanding.

## 2013-03-27 NOTE — Patient Instructions (Signed)
Peripherally Inserted Central Catheter (PICC) Removal and Care After A peripherally inserted catheter (PICC) is removed when it is no longer needed, when it is clotted, or when it may be infected.  PROCEDURE  The removal of a PICC is usually painless. Removing the tape that holds the PICC in place may be the most discomfort you have.  A physicians order needs to be obtained to have the PICC removed.  A PICC can be removed in the hospital or in an outpatient setting.  Never remove or take out the PICC yourself. Only a trained clinical professional, such as a PICC nurse, should remove the PICC.  If a PICC is suspected to be infected, the PICC tip is sent to the lab for culture. HOME CARE INSTRUCTIONS  When the PICC is out, pressure is applied at the insertion site to prevent bleeding. An antibiotic ointment may be applied to the insertion site. A dry, sterile gauze is then taped over the insertion site. This dressing should stay on for 24 hours.  After the 24 hours is up, the dressing may be removed. The PICC insertion site is very small. A small scab may develop over the insertion site. It is okay to wash the site gently with soap and water. Be careful to not remove or pick the scab off. After washing, gently pat the site dry. You do not need to put another dressing over the insertion site after you wash it.  Avoid heavy, strenuous physical activity for 24 hours after the PICC is removed. This includes things like:  Weight lifting.  Strenuous yard work.  Any physical activity with repetitive arm movement. SEEK MEDICAL CARE IF:  Call or see your caregiver as soon as possible if you develop the following conditions in the arm in which the PICC was inserted:  Swelling or puffiness.  Increasing tenderness or pain. SEEK IMMEDIATE MEDICAL CARE IF:  You develop any of the following conditions in the arm that had the PICC:  Numbness or tingling in your fingers, hand, or arm.  You arm has  a bluish color and it is cold to the touch.  Redness around the insertion site or a red-streak that goes up your arm.  Any type of drainage from the PICC insertion site. This includes drainage such as:  Bleeding from the insertion site. (If this happens, apply firm, direct pressure to the PICC insertion site with a clean towel.)  Drainage that is yellow or tan in color.  You have an oral temperature above 102 F (38.9 C), not controlled by medicine. Document Released: 08/23/2009 Document Revised: 05/28/2011 Document Reviewed: 08/23/2009 [employer] Patient Information 2014 [employer].  

## 2013-03-30 ENCOUNTER — Telehealth: Payer: Self-pay | Admitting: *Deleted

## 2013-03-30 ENCOUNTER — Other Ambulatory Visit: Payer: Self-pay | Admitting: Radiation Oncology

## 2013-03-30 ENCOUNTER — Ambulatory Visit
Admission: RE | Admit: 2013-03-30 | Discharge: 2013-03-30 | Disposition: A | Discharge status: Home / Self Care | Payer: Medicare Other | Source: Ambulatory Visit | Attending: Radiation Oncology | Admitting: Radiation Oncology

## 2013-03-30 DIAGNOSIS — C210 Malignant neoplasm of anus, unspecified: Secondary | ICD-10-CM | POA: Diagnosis not present

## 2013-03-30 DIAGNOSIS — Z510 Encounter for antineoplastic radiation therapy: Secondary | ICD-10-CM | POA: Diagnosis not present

## 2013-03-30 DIAGNOSIS — R197 Diarrhea, unspecified: Secondary | ICD-10-CM | POA: Diagnosis not present

## 2013-03-30 DIAGNOSIS — E860 Dehydration: Secondary | ICD-10-CM | POA: Diagnosis not present

## 2013-03-30 DIAGNOSIS — K6289 Other specified diseases of anus and rectum: Secondary | ICD-10-CM | POA: Diagnosis not present

## 2013-03-30 MED ORDER — DIPHENOXYLATE-ATROPINE 2.5-0.025 MG PO TABS: 2.0000 | ORAL_TABLET | Freq: Four times a day (QID) | ORAL | Status: DC | PRN

## 2013-03-30 NOTE — Telephone Encounter (Signed)
Called dr. Lisbeth Renshaw, patient imodium not helping she asked  Miranda RT to have MD call in RX, let MD know diarrhea ongoing ,needs Rx lomotil, ,Miranda called back wanting to give different pharmacy but I was putting patient in room and putting on a dressing on patient, Miranda to call MD with information of pharmacy

## 2013-03-30 NOTE — Telephone Encounter (Signed)
Called [facility] [phone], Dr.Moodys order was e-scribed in, spoke with pharmicist , Aaron Edelman, gave veerbal order from written order in Epic today, lomotil 2 tabs q 4 hours prn for diarrher or loos stools, 40 tabs no refills, then called patient and informed her RX is there 4:00 PM

## 2013-03-30 NOTE — Telephone Encounter (Signed)
error 

## 2013-03-31 ENCOUNTER — Ambulatory Visit
Admission: RE | Admit: 2013-03-31 | Discharge: 2013-03-31 | Disposition: A | Discharge status: Home / Self Care | Payer: Medicare Other | Source: Ambulatory Visit | Attending: Radiation Oncology | Admitting: Radiation Oncology

## 2013-03-31 DIAGNOSIS — E860 Dehydration: Secondary | ICD-10-CM | POA: Diagnosis not present

## 2013-03-31 DIAGNOSIS — R197 Diarrhea, unspecified: Secondary | ICD-10-CM | POA: Diagnosis not present

## 2013-03-31 DIAGNOSIS — K6289 Other specified diseases of anus and rectum: Secondary | ICD-10-CM | POA: Diagnosis not present

## 2013-03-31 DIAGNOSIS — Z510 Encounter for antineoplastic radiation therapy: Secondary | ICD-10-CM | POA: Diagnosis not present

## 2013-03-31 DIAGNOSIS — C210 Malignant neoplasm of anus, unspecified: Secondary | ICD-10-CM | POA: Diagnosis not present

## 2013-04-01 ENCOUNTER — Ambulatory Visit
Admission: RE | Admit: 2013-04-01 | Discharge: 2013-04-01 | Disposition: A | Discharge status: Home / Self Care | Payer: Medicare Other | Source: Ambulatory Visit | Attending: Radiation Oncology | Admitting: Radiation Oncology

## 2013-04-01 DIAGNOSIS — R197 Diarrhea, unspecified: Secondary | ICD-10-CM | POA: Diagnosis not present

## 2013-04-01 DIAGNOSIS — Z510 Encounter for antineoplastic radiation therapy: Secondary | ICD-10-CM | POA: Diagnosis not present

## 2013-04-01 DIAGNOSIS — K6289 Other specified diseases of anus and rectum: Secondary | ICD-10-CM | POA: Diagnosis not present

## 2013-04-01 DIAGNOSIS — E860 Dehydration: Secondary | ICD-10-CM | POA: Diagnosis not present

## 2013-04-01 DIAGNOSIS — C210 Malignant neoplasm of anus, unspecified: Secondary | ICD-10-CM | POA: Diagnosis not present

## 2013-04-02 ENCOUNTER — Ambulatory Visit (HOSPITAL_BASED_OUTPATIENT_CLINIC_OR_DEPARTMENT_OTHER): Payer: Medicare Other | Admitting: Oncology

## 2013-04-02 ENCOUNTER — Other Ambulatory Visit (HOSPITAL_BASED_OUTPATIENT_CLINIC_OR_DEPARTMENT_OTHER): Payer: Medicare Other

## 2013-04-02 ENCOUNTER — Telehealth: Payer: Self-pay | Admitting: Oncology

## 2013-04-02 ENCOUNTER — Ambulatory Visit
Admission: RE | Admit: 2013-04-02 | Discharge: 2013-04-02 | Disposition: A | Discharge status: Home / Self Care | Payer: Medicare Other | Source: Ambulatory Visit | Attending: Radiation Oncology | Admitting: Radiation Oncology

## 2013-04-02 ENCOUNTER — Other Ambulatory Visit: Payer: Medicare Other

## 2013-04-02 VITALS — BP 119/52 | HR 110 | Temp 98.6°F | Resp 18 | Ht 62.0 in | Wt 92.7 lb

## 2013-04-02 DIAGNOSIS — C210 Malignant neoplasm of anus, unspecified: Secondary | ICD-10-CM

## 2013-04-02 DIAGNOSIS — J449 Chronic obstructive pulmonary disease, unspecified: Secondary | ICD-10-CM

## 2013-04-02 DIAGNOSIS — E876 Hypokalemia: Secondary | ICD-10-CM

## 2013-04-02 DIAGNOSIS — D6959 Other secondary thrombocytopenia: Secondary | ICD-10-CM | POA: Diagnosis not present

## 2013-04-02 DIAGNOSIS — C20 Malignant neoplasm of rectum: Secondary | ICD-10-CM

## 2013-04-02 DIAGNOSIS — D702 Other drug-induced agranulocytosis: Secondary | ICD-10-CM | POA: Diagnosis not present

## 2013-04-02 DIAGNOSIS — Z510 Encounter for antineoplastic radiation therapy: Secondary | ICD-10-CM | POA: Diagnosis not present

## 2013-04-02 DIAGNOSIS — E860 Dehydration: Secondary | ICD-10-CM | POA: Diagnosis not present

## 2013-04-02 DIAGNOSIS — R634 Abnormal weight loss: Secondary | ICD-10-CM

## 2013-04-02 DIAGNOSIS — R197 Diarrhea, unspecified: Secondary | ICD-10-CM | POA: Diagnosis not present

## 2013-04-02 DIAGNOSIS — K6289 Other specified diseases of anus and rectum: Secondary | ICD-10-CM | POA: Diagnosis not present

## 2013-04-02 DIAGNOSIS — N949 Unspecified condition associated with female genital organs and menstrual cycle: Secondary | ICD-10-CM | POA: Diagnosis not present

## 2013-04-02 LAB — BASIC METABOLIC PANEL (CC13)
ANION GAP: 11 meq/L (ref 3–11)
BUN: 18.5 mg/dL (ref 7.0–26.0)
CO2: 23 mEq/L (ref 22–29)
CREATININE: 0.7 mg/dL (ref 0.6–1.1)
Calcium: 8.9 mg/dL (ref 8.4–10.4)
Chloride: 96 mEq/L — ABNORMAL LOW (ref 98–109)
Glucose: 110 mg/dl (ref 70–140)
Potassium: 4.6 mEq/L (ref 3.5–5.1)
Sodium: 130 mEq/L — ABNORMAL LOW (ref 136–145)

## 2013-04-02 LAB — CBC WITH DIFFERENTIAL/PLATELET
BASO%: 0.8 % (ref 0.0–2.0)
BASOS ABS: 0 10*3/uL (ref 0.0–0.1)
EOS ABS: 0 10*3/uL (ref 0.0–0.5)
EOS%: 0 % (ref 0.0–7.0)
HEMATOCRIT: 33.5 % — AB (ref 34.8–46.6)
HEMOGLOBIN: 11.1 g/dL — AB (ref 11.6–15.9)
LYMPH%: 20.5 % (ref 14.0–49.7)
MCH: 29.1 pg (ref 25.1–34.0)
MCHC: 33.1 g/dL (ref 31.5–36.0)
MCV: 87.7 fL (ref 79.5–101.0)
MONO#: 0.4 10*3/uL (ref 0.1–0.9)
MONO%: 34.6 % — AB (ref 0.0–14.0)
NEUT%: 44.1 % (ref 38.4–76.8)
NEUTROS ABS: 0.6 10*3/uL — AB (ref 1.5–6.5)
PLATELETS: 110 10*3/uL — AB (ref 145–400)
RBC: 3.82 10*6/uL (ref 3.70–5.45)
RDW: 13.7 % (ref 11.2–14.5)
WBC: 1.3 10*3/uL — ABNORMAL LOW (ref 3.9–10.3)
lymph#: 0.3 10*3/uL — ABNORMAL LOW (ref 0.9–3.3)

## 2013-04-02 NOTE — Telephone Encounter (Signed)
gv adn printed appt sched and avs for pt for Jan °

## 2013-04-02 NOTE — Progress Notes (Signed)
   [place]    OFFICE PROGRESS NOTE   INTERVAL HISTORY:   Sara Cochran returns for scheduled followup of anal cancer. She completed cycle 2 of chemotherapy beginning on 03/23/2013. She continues radiation. She reports diarrhea over the past week, improved with Lomotil. She reports one bowel movement today. She is drinking adequate fluids, but does not eat much. She reports altered taste and anorexia. She has developed increased skin breakdown at the perineum and takes hydrocodone twice daily.  Objective:  Vital signs in last 24 hours:  Blood pressure 119/52, pulse 110, temperature 98.6 F (37 C), temperature source Oral, resp. rate 18, height 5\' 2"  (1.575 m), weight 92 lb 11.2 oz (42.048 kg).    HEENT: No thrush or ulcer Lymphatics: No inguinal nodes Resp: Distant breath sounds, no respiratory distress Cardio: Regular rate and rhythm GI: No hepatomegaly, nontender, no mass Vascular: No leg edema  Skin: There is skin breakdown with ulcerations surrounding the perineum, erythema in the groin     Lab Results:  Lab Results  Component Value Date   WBC 1.3* 04/02/2013   HGB 11.1* 04/02/2013   HCT 33.5* 04/02/2013   MCV 87.7 04/02/2013   PLT 110* 04/02/2013   NEUTROABS 0.6* 04/02/2013   Potassium 4.6, BUN 18.5, creatinine 0.7   Medications: I have reviewed the patient's current medications.  Assessment/Plan: 1. 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 Initiation of radiation and cycle 1 5-FU/mitomycin C. 02/23/2013, cycle 2 03/23/2013. 2. CT evidence of a perirectal abscess. 3. COPD. 4. "Sore throat" 02/26/2013. Resolved. 5. Thrombocytopenia/neutropenia secondary to chemotherapy       6.   Hypokalemia 03/23/2013. Now on potassium replacement, normal today        7.   pain secondary to radiation skin breakdown at the perineum, she will continue hydrocodone. I suggested she use a barrier  cream        8.   weight loss-I recommended she begin nutrition supplements and push oral hydration. She declined IV fluids today .she has seen the cancer Center nutritionist   Disposition:  Sara Cochran completed the final cycle of chemotherapy for treatment of anal cancer. She is scheduled to complete radiation 04/06/2013. She will return for an office visit on 04/09/2013. She will use a barrier cream and hydrocodone for the perineal skin breakdown. She will hold the blood pressure medications for now. A CBC will be checked on 04/06/2013.   Betsy Coder, MD  04/02/2013  12:41 PM

## 2013-04-03 ENCOUNTER — Ambulatory Visit
Admission: RE | Admit: 2013-04-03 | Discharge: 2013-04-03 | Disposition: A | Discharge status: Home / Self Care | Payer: Medicare Other | Source: Ambulatory Visit | Attending: Radiation Oncology | Admitting: Radiation Oncology

## 2013-04-03 ENCOUNTER — Ambulatory Visit: Payer: Medicare Other

## 2013-04-03 ENCOUNTER — Other Ambulatory Visit: Payer: Self-pay | Admitting: *Deleted

## 2013-04-03 ENCOUNTER — Telehealth: Payer: Self-pay | Admitting: *Deleted

## 2013-04-03 ENCOUNTER — Ambulatory Visit (HOSPITAL_BASED_OUTPATIENT_CLINIC_OR_DEPARTMENT_OTHER): Payer: Medicare Other

## 2013-04-03 VITALS — BP 129/74 | HR 112 | Temp 97.0°F

## 2013-04-03 VITALS — BP 132/69 | HR 110 | Temp 98.3°F | Ht 62.0 in

## 2013-04-03 DIAGNOSIS — C210 Malignant neoplasm of anus, unspecified: Secondary | ICD-10-CM

## 2013-04-03 DIAGNOSIS — R634 Abnormal weight loss: Secondary | ICD-10-CM

## 2013-04-03 DIAGNOSIS — C20 Malignant neoplasm of rectum: Secondary | ICD-10-CM | POA: Diagnosis not present

## 2013-04-03 DIAGNOSIS — Z510 Encounter for antineoplastic radiation therapy: Secondary | ICD-10-CM | POA: Diagnosis not present

## 2013-04-03 DIAGNOSIS — R197 Diarrhea, unspecified: Secondary | ICD-10-CM | POA: Diagnosis not present

## 2013-04-03 DIAGNOSIS — K6289 Other specified diseases of anus and rectum: Secondary | ICD-10-CM | POA: Diagnosis not present

## 2013-04-03 DIAGNOSIS — E860 Dehydration: Secondary | ICD-10-CM | POA: Diagnosis not present

## 2013-04-03 MED ORDER — SODIUM CHLORIDE 0.9 % IV SOLN: INTRAVENOUS | Status: DC

## 2013-04-03 MED ORDER — SODIUM CHLORIDE 0.9 % IV SOLN
Freq: Once | INTRAVENOUS | Status: AC
  Administered 2013-04-03: 16:00:00 via INTRAVENOUS

## 2013-04-03 NOTE — Patient Instructions (Signed)
Dehydration, Elderly Dehydration means your body does not have as much fluid as it needs. Your kidneys, brain, and heart will not work properly without the right amount of fluids and salt. Older adults are more likely to become dehydrated than younger adults. This is because:   Their bodies do not hold water as well.  Their bodies do not respond to temperature changes as well.  They do not get thirsty as easily or as quickly. HOME CARE  Ask your doctor how to replace body fluid losses (rehydrate).  Drink enough fluids to keep your pee (urine) clear or pale yellow.  Drink small amounts of fluids often if you feel sick to your stomach (nauseous) or throw up (vomit).  Eat like you normally do.  Avoid:  Foods or drinks high in sugar.  Bubbly (carbonated) drinks.  Juice.  Very hot or cold fluids.  Drinks with caffeine.  Fatty, greasy foods.  Alcohol.  Tobacco.  Eating too much.  Gelatin desserts.  Wash your hands to avoid spreading germs (bacteria, viruses).  Only take medicine as told by your doctor.  Keep all doctor visits as told. GET HELP IF:  You have belly (abdominal) pain that gets worse or stays in one spot (localizes).  You have a rash, stiff neck, or bad headache.  You get easily annoyed, sleepy, or are hard to wake up.  You feel weak, dizzy, or very thirsty. GET HELP RIGHT AWAY IF:   You cannot drink fluid without throwing up.  You get worse even with treatment.  You throw up often.  You have watery poop (diarrhea) often.  Your vomit has blood in it or looks greenish.  Your poop (stool) has blood in it or looks black and tarry.  You have not peed in 6 to 8 hours or have only peed a small amount of very dark pee.  You have a fever.  You pass out (faint). MAKE SURE YOU:   Understand these instructions.  Will watch your condition.  Will get help right away if you are not doing well or get worse. Document Released: 02/22/2011 Document  Revised: 12/24/2012 Document Reviewed: 11/10/2012 [employer] Patient Information 2014 [employer].  

## 2013-04-03 NOTE — Telephone Encounter (Signed)
Call from pt's friend Lenna Sciara reporting pt is requesting IV fluids today, she declined them on 1/15. Pt worked in for IVF. Instructed Melissa to have pt check in for infusion.

## 2013-04-03 NOTE — Progress Notes (Signed)
Sara Cochran has had 27 fractions to her anal area/pelvis.  She is having pain in her rectal area that she is rating at a 5/10.  She reports with bowel movements it is "astronomical."  She is taking 1 lorcet in the am and 1 in the pm.  She is very fatigued.  She reports a poor appetite and not eating very much. She reports she is trying to drink a lot of water.  She refused to be weight today.  She is going for IV fluids this afternoon.  She reports having frequent diarrhea.  She is taking lomotil which is helping.  She reports that her stomach is "gripy."  The skin on her rectal area is red with disquamation.  She reports seeing blood when wiping.  She is using her sitz bath.  She reports that she was told to apply Vaseline by Dr. Benay Spice.

## 2013-04-03 NOTE — Progress Notes (Signed)
Department of Radiation Oncology  Phone:  [phone] Fax:        [phone]  Weekly Treatment Note    Name: Sara Cochran Date: 04/03/2013 MRN: [id number] DOB: Mar 23, 1933   Current dose: 48.6 Gy  Current fraction: 27   MEDICATIONS: Current Outpatient Prescriptions  Medication Sig Dispense Refill  . albuterol (PROVENTIL HFA;VENTOLIN HFA) 108 (90 BASE) MCG/ACT inhaler Inhale 2 puffs into the lungs every 6 (six) hours as needed.      . ALPRAZolam (XANAX) 1 MG tablet Take 1 mg by mouth at bedtime as needed.      . diphenoxylate-atropine (LOMOTIL) 2.5-0.025 MG per tablet Take 2 tablets by mouth 4 (four) times daily as needed for diarrhea or loose stools.  40 tablet  0  . HYDROcodone-acetaminophen (LORCET) 10-650 MG per tablet Take 1 tablet by mouth every 6 (six) hours as needed.      Marland Kitchen ipratropium-albuterol (DUONEB) 0.5-2.5 (3) MG/3ML SOLN Take 3 mLs by nebulization.      Marland Kitchen omeprazole (PRILOSEC) 20 MG capsule Take 20 mg by mouth daily.      . potassium chloride SA (K-DUR,KLOR-CON) 20 MEQ tablet Take 1 tablet (20 mEq total) by mouth daily.  30 tablet  0  . pravastatin (PRAVACHOL) 40 MG tablet Take 40 mg by mouth daily.      Marland Kitchen amLODipine (NORVASC) 5 MG tablet Take 5 mg by mouth daily.       Marland Kitchen estradiol (ESTRACE) 2 MG tablet Take 2 mg by mouth daily.       Marland Kitchen lidocaine (XYLOCAINE) 5 % ointment Apply 1 application topically as needed.      . loperamide (IMODIUM) 2 MG capsule Take 2 mg by mouth as needed for diarrhea or loose stools.      Marland Kitchen olmesartan (BENICAR) 40 MG tablet Take 40 mg by mouth daily.      . prochlorperazine (COMPAZINE) 5 MG tablet Take 1 tablet (5 mg total) by mouth every 6 (six) hours as needed for nausea or vomiting.  30 tablet  1   No current facility-administered medications for this encounter.     ALLERGIES: Aspirin; Erythromycin; and Niacin and related   LABORATORY DATA:  Lab Results  Component Value Date   WBC 1.3* 04/02/2013   HGB 11.1* 04/02/2013   HCT 33.5* 04/02/2013   MCV 87.7 04/02/2013   PLT 110* 04/02/2013   Lab Results  Component Value Date   NA 130* 04/02/2013   K 4.6 04/02/2013   CO2 23 04/02/2013   Lab Results  Component Value Date   ALT 7 03/23/2013   AST 12 03/23/2013   ALKPHOS 45 03/23/2013   BILITOT 0.22 03/23/2013     NARRATIVE: Sara Cochran was seen today for weekly treatment management. The chart was checked and the patient's films were reviewed. The patient has one more fraction remaining. She rates her rectal area pain and a 5/10. She has been having some diarrhea which is better controlled with Lomotil.  PHYSICAL EXAMINATION: height is 5\' 2"  (1.575 m). Her temperature is 98.3 F (36.8 C). Her blood pressure is 132/69 and her pulse is 110. Her oxygen saturation is 92%.        ASSESSMENT: The patient is doing satisfactorily with treatment.  PLAN: We will continue with the patient's radiation treatment as planned. The patient has done satisfactorily so far although she is having significant skin irritation. We will finish her final fraction on Monday. I discussed using a and D. ointment which  may be soothing and she also does have Xylocaine cream as well which we also discussed.

## 2013-04-06 ENCOUNTER — Ambulatory Visit
Admission: RE | Admit: 2013-04-06 | Discharge: 2013-04-06 | Disposition: A | Discharge status: Home / Self Care | Payer: Medicare Other | Source: Ambulatory Visit | Attending: Radiation Oncology | Admitting: Radiation Oncology

## 2013-04-06 ENCOUNTER — Other Ambulatory Visit (HOSPITAL_BASED_OUTPATIENT_CLINIC_OR_DEPARTMENT_OTHER): Payer: Medicare Other

## 2013-04-06 ENCOUNTER — Ambulatory Visit: Payer: Medicare Other

## 2013-04-06 ENCOUNTER — Encounter: Payer: Self-pay | Admitting: Radiation Oncology

## 2013-04-06 DIAGNOSIS — Z510 Encounter for antineoplastic radiation therapy: Secondary | ICD-10-CM | POA: Diagnosis not present

## 2013-04-06 DIAGNOSIS — C210 Malignant neoplasm of anus, unspecified: Secondary | ICD-10-CM | POA: Diagnosis not present

## 2013-04-06 DIAGNOSIS — E860 Dehydration: Secondary | ICD-10-CM | POA: Diagnosis not present

## 2013-04-06 DIAGNOSIS — R197 Diarrhea, unspecified: Secondary | ICD-10-CM | POA: Diagnosis not present

## 2013-04-06 DIAGNOSIS — C20 Malignant neoplasm of rectum: Secondary | ICD-10-CM

## 2013-04-06 DIAGNOSIS — K6289 Other specified diseases of anus and rectum: Secondary | ICD-10-CM | POA: Diagnosis not present

## 2013-04-06 LAB — CBC WITH DIFFERENTIAL/PLATELET
BASO%: 0.1 % (ref 0.0–2.0)
Basophils Absolute: 0 10*3/uL (ref 0.0–0.1)
EOS%: 4.2 % (ref 0.0–7.0)
Eosinophils Absolute: 0.3 10*3/uL (ref 0.0–0.5)
HEMATOCRIT: 35.6 % (ref 34.8–46.6)
HGB: 11.9 g/dL (ref 11.6–15.9)
LYMPH#: 0.4 10*3/uL — AB (ref 0.9–3.3)
LYMPH%: 5.1 % — AB (ref 14.0–49.7)
MCH: 29.5 pg (ref 25.1–34.0)
MCHC: 33.3 g/dL (ref 31.5–36.0)
MCV: 88.5 fL (ref 79.5–101.0)
MONO#: 1 10*3/uL — ABNORMAL HIGH (ref 0.1–0.9)
MONO%: 13.4 % (ref 0.0–14.0)
NEUT#: 5.9 10*3/uL (ref 1.5–6.5)
NEUT%: 77.2 % — ABNORMAL HIGH (ref 38.4–76.8)
Platelets: 87 10*3/uL — ABNORMAL LOW (ref 145–400)
RBC: 4.03 10*6/uL (ref 3.70–5.45)
RDW: 14.1 % (ref 11.2–14.5)
WBC: 7.6 10*3/uL (ref 3.9–10.3)

## 2013-04-06 LAB — TECHNOLOGIST REVIEW

## 2013-04-07 ENCOUNTER — Telehealth: Payer: Self-pay

## 2013-04-07 ENCOUNTER — Other Ambulatory Visit: Payer: Self-pay

## 2013-04-07 ENCOUNTER — Encounter (INDEPENDENT_AMBULATORY_CARE_PROVIDER_SITE_OTHER): Payer: Self-pay | Admitting: General Surgery

## 2013-04-07 MED ORDER — DIPHENOXYLATE-ATROPINE 2.5-0.025 MG PO TABS: 2.0000 | ORAL_TABLET | Freq: Four times a day (QID) | ORAL | Status: AC | PRN

## 2013-04-07 NOTE — Telephone Encounter (Signed)
Refill for lomotil script called into [facility],  [place] to [employer].Messge left to inform patient.

## 2013-04-09 ENCOUNTER — Telehealth: Payer: Self-pay | Admitting: Oncology

## 2013-04-09 ENCOUNTER — Ambulatory Visit: Payer: Medicare Other | Admitting: Nurse Practitioner

## 2013-04-09 NOTE — Telephone Encounter (Signed)
Pt called and cancelled appt, she will call to r/s appt, nurse notified

## 2013-04-10 ENCOUNTER — Telehealth: Payer: Self-pay | Admitting: Oncology

## 2013-04-10 ENCOUNTER — Other Ambulatory Visit: Payer: Self-pay | Admitting: *Deleted

## 2013-04-10 DIAGNOSIS — C210 Malignant neoplasm of anus, unspecified: Secondary | ICD-10-CM

## 2013-04-10 NOTE — Telephone Encounter (Signed)
s.w. pt and advised on Jan appt.....pt ok and aware °

## 2013-04-15 ENCOUNTER — Ambulatory Visit (HOSPITAL_BASED_OUTPATIENT_CLINIC_OR_DEPARTMENT_OTHER): Payer: Medicare Other

## 2013-04-15 ENCOUNTER — Telehealth: Payer: Self-pay | Admitting: Oncology

## 2013-04-15 ENCOUNTER — Ambulatory Visit (HOSPITAL_BASED_OUTPATIENT_CLINIC_OR_DEPARTMENT_OTHER): Payer: Medicare Other | Admitting: Nurse Practitioner

## 2013-04-15 ENCOUNTER — Other Ambulatory Visit (HOSPITAL_BASED_OUTPATIENT_CLINIC_OR_DEPARTMENT_OTHER): Payer: Medicare Other

## 2013-04-15 VITALS — BP 101/55 | HR 124 | Temp 98.2°F | Resp 18 | Ht 62.0 in | Wt 81.7 lb

## 2013-04-15 DIAGNOSIS — E876 Hypokalemia: Secondary | ICD-10-CM | POA: Diagnosis not present

## 2013-04-15 DIAGNOSIS — C210 Malignant neoplasm of anus, unspecified: Secondary | ICD-10-CM

## 2013-04-15 DIAGNOSIS — D696 Thrombocytopenia, unspecified: Secondary | ICD-10-CM

## 2013-04-15 DIAGNOSIS — K612 Anorectal abscess: Secondary | ICD-10-CM

## 2013-04-15 DIAGNOSIS — R197 Diarrhea, unspecified: Secondary | ICD-10-CM

## 2013-04-15 DIAGNOSIS — R634 Abnormal weight loss: Secondary | ICD-10-CM | POA: Diagnosis not present

## 2013-04-15 LAB — CBC WITH DIFFERENTIAL/PLATELET
BASO%: 0.5 % (ref 0.0–2.0)
BASOS ABS: 0 10*3/uL (ref 0.0–0.1)
EOS%: 0.2 % (ref 0.0–7.0)
Eosinophils Absolute: 0 10*3/uL (ref 0.0–0.5)
HCT: 34.6 % — ABNORMAL LOW (ref 34.8–46.6)
HEMOGLOBIN: 11.6 g/dL (ref 11.6–15.9)
LYMPH%: 11.4 % — ABNORMAL LOW (ref 14.0–49.7)
MCH: 29.1 pg (ref 25.1–34.0)
MCHC: 33.5 g/dL (ref 31.5–36.0)
MCV: 86.9 fL (ref 79.5–101.0)
MONO#: 0.4 10*3/uL (ref 0.1–0.9)
MONO%: 10.4 % (ref 0.0–14.0)
NEUT#: 3.1 10*3/uL (ref 1.5–6.5)
NEUT%: 77.5 % — ABNORMAL HIGH (ref 38.4–76.8)
Platelets: 74 10*3/uL — ABNORMAL LOW (ref 145–400)
RBC: 3.98 10*6/uL (ref 3.70–5.45)
RDW: 14.9 % — AB (ref 11.2–14.5)
WBC: 4 10*3/uL (ref 3.9–10.3)
lymph#: 0.5 10*3/uL — ABNORMAL LOW (ref 0.9–3.3)
nRBC: 0 % (ref 0–0)

## 2013-04-15 LAB — BASIC METABOLIC PANEL (CC13)
Anion Gap: 15 mEq/L — ABNORMAL HIGH (ref 3–11)
BUN: 39.3 mg/dL — ABNORMAL HIGH (ref 7.0–26.0)
CALCIUM: 9 mg/dL (ref 8.4–10.4)
CO2: 22 meq/L (ref 22–29)
Chloride: 90 mEq/L — ABNORMAL LOW (ref 98–109)
Creatinine: 1 mg/dL (ref 0.6–1.1)
Glucose: 127 mg/dl (ref 70–140)
Potassium: 3.9 mEq/L (ref 3.5–5.1)
Sodium: 127 mEq/L — ABNORMAL LOW (ref 136–145)

## 2013-04-15 LAB — TECHNOLOGIST REVIEW

## 2013-04-15 MED ORDER — SODIUM CHLORIDE 0.9 % IV SOLN
INTRAVENOUS | Status: DC
  Administered 2013-04-15: 15:00:00 via INTRAVENOUS

## 2013-04-15 NOTE — Progress Notes (Signed)
OFFICE PROGRESS NOTE  Interval history:  Ms. Sara Cochran returns for followup of anal cancer. She states that she feels "weak". Appetite is poor. She attributes this to an alteration in taste. She is tolerating fluids without difficulty. Her family does not feel she is drinking enough. They estimate 24 ounces per day. She continues to have loose stools. She notes increased frequency after eating. She has decreased her typical dose of Lomotil. She has had no bowel movements thus far today. She estimates 4 or 5 yesterday. She denies fever. No mouth sores. She has had some "fever blisters" on her lips.   Objective: Filed Vitals:   04/15/13 1345  BP: 101/55  Pulse: 124  Temp: 98.2 F (36.8 C)  Resp: 18   repeat heart rate 104  Crusted lesion at the lower lip. Oropharynx is without thrush or ulceration. Mucous membranes appear dry. Decreased skin turgor. Lungs are clear. Regular cardiac rhythm. Mildly tachycardic. Abdomen soft and nontender. No hepatomegaly. No leg edema. Erythema with skin breakdown at the perineum.   Lab Results: Lab Results  Component Value Date   WBC 4.0 04/15/2013   HGB 11.6 04/15/2013   HCT 34.6* 04/15/2013   MCV 86.9 04/15/2013   PLT 74* 04/15/2013   NEUTROABS 3.1 04/15/2013    Chemistry:    Chemistry      Component Value Date/Time   NA 130* 04/02/2013 1116   K 4.6 04/02/2013 1116   CO2 23 04/02/2013 1116   BUN 18.5 04/02/2013 1116   BUN 14 02/03/2013 1257   CREATININE 0.7 04/02/2013 1116   CREATININE 0.62 02/03/2013 1257      Component Value Date/Time   CALCIUM 8.9 04/02/2013 1116   ALKPHOS 45 03/23/2013 1112   AST 12 03/23/2013 1112   ALT 7 03/23/2013 1112   BILITOT 0.22 03/23/2013 1112       Studies/Results: Ir Fluoro Guide Cv Line Right  03/23/2013   CLINICAL DATA:  History of anal cancer, PICC line placement for chemotherapy.  EXAM: IR RIGHT FLOURO GUIDE CV LINE; IR ULTRASOUND GUIDANCE VASC ACCESS RIGHT  TECHNIQUE: The right arm was prepped with chlorhexidine,  draped in the usual sterile fashion using maximum barrier technique (cap and mask, sterile gown, sterile gloves, large sterile sheet, hand hygiene and cutaneous antiseptic). Local anesthesia was attained by infiltration with 1% lidocaine.  Ultrasound demonstrated patency of the basilic vein, and this was documented with an image. Under real-time ultrasound guidance, this vein was accessed with a 21 gauge micropuncture needle and image documentation was performed. The needle was exchanged over a guidewire for a peel-away sheath through which a 38 cm 5 [place] single lumen power injectable PICC was advanced, and positioned with its tip at the lower SVC/right atrial junction. Fluoroscopy during the procedure and fluoro spot radiograph confirms appropriate catheter position. The catheter was flushed, secured to the skin with Prolene sutures, and covered with a sterile dressing.  FLUOROSCOPY TIME:  3 minutes, 36 seconds.  COMPLICATIONS: None.  The patient tolerated the procedure well.  IMPRESSION: Successful placement of a right arm PICC with sonographic and fluoroscopic guidance. The catheter is ready for use.  Read by: Ascencion Dike PA-C   Electronically Signed   By: Maryclare Bean M.D.   On: 03/23/2013 10:02   Ir US Guide Vasc Access Right  03/23/2013   CLINICAL DATA:  History of anal cancer, PICC line placement for chemotherapy.  EXAM: IR RIGHT FLOURO GUIDE CV LINE; IR ULTRASOUND GUIDANCE VASC ACCESS RIGHT  TECHNIQUE: The  right arm was prepped with chlorhexidine, draped in the usual sterile fashion using maximum barrier technique (cap and mask, sterile gown, sterile gloves, large sterile sheet, hand hygiene and cutaneous antiseptic). Local anesthesia was attained by infiltration with 1% lidocaine.  Ultrasound demonstrated patency of the basilic vein, and this was documented with an image. Under real-time ultrasound guidance, this vein was accessed with a 21 gauge micropuncture needle and image documentation was  performed. The needle was exchanged over a guidewire for a peel-away sheath through which a 38 cm 5 [place] single lumen power injectable PICC was advanced, and positioned with its tip at the lower SVC/right atrial junction. Fluoroscopy during the procedure and fluoro spot radiograph confirms appropriate catheter position. The catheter was flushed, secured to the skin with Prolene sutures, and covered with a sterile dressing.  FLUOROSCOPY TIME:  3 minutes, 36 seconds.  COMPLICATIONS: None.  The patient tolerated the procedure well.  IMPRESSION: Successful placement of a right arm PICC with sonographic and fluoroscopic guidance. The catheter is ready for use.  Read by: Ascencion Dike PA-C   Electronically Signed   By: Maryclare Bean M.D.   On: 03/23/2013 10:02    Medications: I have reviewed the patient's current medications.  Assessment/Plan: 1. 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 Initiation of radiation and cycle 1 5-FU/mitomycin C. 02/23/2013, cycle 2 03/23/2013. Completed radiation 04/06/2013. 2. CT evidence of a perirectal abscess. 3. COPD. 4. "Sore throat" 02/26/2013. Resolved. 5. Thrombocytopenia/neutropenia secondary to chemotherapy. The white count is better. She continues to have thrombocytopenia. 6. Hypokalemia 03/23/2013. She is on a potassium supplement. Potassium level was normal on 04/02/2013. 7. Pain secondary to radiation skin breakdown the perineum. 8. Weight loss. She continues to lose weight. 9. Diarrhea. Likely secondary to radiation.    Dispositon-she appears dehydrated. She is agreeable to IV fluids daily for the next 3 days. We will obtain a basic metabolic panel today.  The diarrhea is likely related to radiation. Her family would like testing for C. difficile. She was given a collection container. She has Lomotil and Imodium.  She continues to lose weight. [facility] diet[facility] will meet with  her tomorrow.  We will see her in followup prior to IV fluids 04/16/2013. She and her son understand to contact the office in the interim with any problems. We discussed hospitalization. They prefer to try to manage this as an outpatient.    Ned Card ANP/GNP-BC

## 2013-04-15 NOTE — Patient Instructions (Signed)
Clostridium Difficile Infection Clostridium difficile (C. difficile) is a bacteria found in the intestinal tract or colon. Under certain conditions, it causes diarrhea and sometimes severe disease. The severe form of the disease is known as pseudomembranous colitis (often called C. difficile colitis). This disease can damage the lining of the colon or cause the colon to become enlarged (toxic megacolon).  CAUSES  Your colon normally contains many different bacteria, including C. difficile. The balance of bacteria in your colon can change during illness. This is especially true when you take antibiotic medicine. Taking antibiotics may allow the C. difficile to grow, multiply excessively, and make a toxin that then causes illness. The elderly and people with certain medical conditions have a greater risk of getting C. difficile infections. SYMPTOMS   Watery diarrhea.  Fever.  Fatigue.  Loss of appetite.  Nausea.  Abdominal swelling, pain, or tenderness.  Dehydration. DIAGNOSIS  Your symptoms may make your caregiver suspicious of a C. difficile infection, especially if you have used antibiotics in the preceding weeks. However, there are only 2 ways to know for certain whether you have a C. difficile infection:  A lab test that finds the toxin in your stool.  The specific appearance of an abnormality (pseudomembrane) in your colon. This can only be seen by doing a sigmoidoscopy or colonoscopy. These procedures involve passing an instrument through your rectum to look at the inside of your colon. Your caregiver will help determine if these tests are necessary. TREATMENT   Most people are successfully treated with one of two specific antibiotics, usually given by mouth. Other antibiotics you are receiving are stopped if possible.  Intravenous (IV) fluids and correction of electrolyte imbalance may be necessary.  Rarely, surgery may be needed to remove the infected part of the  intestines.  Careful hand washing by you and your caregivers is important to prevent the spread of infection. In the hospital, your caregivers may also put on gowns and gloves to prevent the spread of the C. difficile bacteria. Your room is also cleaned regularly with a solution containing bleach or a product that is known to kill C. difficile. HOME CARE INSTRUCTIONS  Drink enough fluids to keep your urine clear or pale yellow. Avoid milk, caffeine, and alcohol.  Ask your caregiver for specific rehydration instructions.  Try eating small, frequent meals rather than large meals.  Take your antibiotics as directed. Finish them even if you start to feel better.  Do not use medicines to slow diarrhea. This could delay healing or cause complications.  Wash your hands thoroughly after using the bathroom and before preparing food.  Make sure people who live with you wash their hands often, too.  Carefully disinfect all surfaces with a product that contains chlorine bleach. SEEK MEDICAL CARE IF:  Diarrhea persists longer than expected or recurs after completing your course of antibiotic treatment for the C. difficile infection.  You have trouble staying hydrated. SEEK IMMEDIATE MEDICAL CARE IF:  You develop a new fever.  You have increasing abdominal pain or tenderness.  There is blood in your stools, or your stools are dark black and tarry.  You cannot hold down food or liquids. MAKE SURE YOU:   Understand these instructions.  Will watch your condition.  Will get help right away if you are not doing well or get worse. Document Released: 12/13/2004 Document Revised: 06/30/2012 Document Reviewed: 08/11/2010 [facility] Patient Information 2014 [place], [place]. Dehydration, Adult Dehydration is when you lose more fluids from the body  than you take in. Vital organs like the kidneys, brain, and heart cannot function without a proper amount of fluids and salt. Any loss of fluids from the  body can cause dehydration.  CAUSES   Vomiting.  Diarrhea.  Excessive sweating.  Excessive urine output.  Fever. SYMPTOMS  Mild dehydration  Thirst.  Dry lips.  Slightly dry mouth. Moderate dehydration  Very dry mouth.  Sunken eyes.  Skin does not bounce back quickly when lightly pinched and released.  Dark urine and decreased urine production.  Decreased tear production.  Headache. Severe dehydration  Very dry mouth.  Extreme thirst.  Rapid, weak pulse (more than 100 beats per minute at rest).  Cold hands and feet.  Not able to sweat in spite of heat and temperature.  Rapid breathing.  Blue lips.  Confusion and lethargy.  Difficulty being awakened.  Minimal urine production.  No tears. DIAGNOSIS  Your caregiver will diagnose dehydration based on your symptoms and your exam. Blood and urine tests will help confirm the diagnosis. The diagnostic evaluation should also identify the cause of dehydration. TREATMENT  Treatment of mild or moderate dehydration can often be done at home by increasing the amount of fluids that you drink. It is best to drink small amounts of fluid more often. Drinking too much at one time can make vomiting worse. Refer to the home care instructions below. Severe dehydration needs to be treated at the hospital where you will probably be given intravenous (IV) fluids that contain water and electrolytes. HOME CARE INSTRUCTIONS   Ask your caregiver about specific rehydration instructions.  Drink enough fluids to keep your urine clear or pale yellow.  Drink small amounts frequently if you have nausea and vomiting.  Eat as you normally do.  Avoid:  Foods or drinks high in sugar.  Carbonated drinks.  Juice.  Extremely hot or cold fluids.  Drinks with caffeine.  Fatty, greasy foods.  Alcohol.  Tobacco.  Overeating.  Gelatin desserts.  Wash your hands well to avoid spreading bacteria and viruses.  Only take  over-the-counter or prescription medicines for pain, discomfort, or fever as directed by your caregiver.  Ask your caregiver if you should continue all prescribed and over-the-counter medicines.  Keep all follow-up appointments with your caregiver. SEEK MEDICAL CARE IF:  You have abdominal pain and it increases or stays in one area (localizes).  You have a rash, stiff neck, or severe headache.  You are irritable, sleepy, or difficult to awaken.  You are weak, dizzy, or extremely thirsty. SEEK IMMEDIATE MEDICAL CARE IF:   You are unable to keep fluids down or you get worse despite treatment.  You have frequent episodes of vomiting or diarrhea.  You have blood or green matter (bile) in your vomit.  You have blood in your stool or your stool looks black and tarry.  You have not urinated in 6 to 8 hours, or you have only urinated a small amount of very dark urine.  You have a fever.  You faint. MAKE SURE YOU:   Understand these instructions.  Will watch your condition.  Will get help right away if you are not doing well or get worse. Document Released: 03/05/2005 Document Revised: 05/28/2011 Document Reviewed: 10/23/2010 [facility] Patient Information 2014 [place], [place].

## 2013-04-15 NOTE — Progress Notes (Signed)
  Radiation Oncology         [phone] ________________________________  Name: Sara Cochran MRN: [id number]  Date: 04/06/2013  DOB: 06-28-1933  End of Treatment Note  Diagnosis:   Squamous cell carcinoma of the proximal anal canal/distal rectum     Indication for treatment:  Curative       Radiation treatment dates:   02/23/2013 through 04/06/2013  Site/dose:   The patient was treated to the high dose region to a dose of 50.4 gray in 28 fractions. She was treated using a IMRT technique with daily image guidance.  Narrative: The patient tolerated radiation treatment relatively well.   The patient did experience some irritation in the anal/rectal region treatment proceeded. She did not require any significant delays in her course of radiation.   Plan: The patient has completed radiation treatment. The patient will return to radiation oncology clinic for routine followup in one month. I advised the patient to call or return sooner if they have any questions or concerns related to their recovery or treatment. ________________________________  Jodelle Gross, M.D., Ph.D.

## 2013-04-15 NOTE — Progress Notes (Signed)
Pt had diarrhea stool & sample sent to lab for C-diff.

## 2013-04-15 NOTE — Telephone Encounter (Signed)
gv and printed appt sched and avs for pt for Jan....sed and MW added tx....pt ok adn awre

## 2013-04-16 ENCOUNTER — Ambulatory Visit (HOSPITAL_BASED_OUTPATIENT_CLINIC_OR_DEPARTMENT_OTHER): Payer: Medicare Other

## 2013-04-16 ENCOUNTER — Ambulatory Visit: Payer: Medicare Other | Admitting: Nutrition

## 2013-04-16 ENCOUNTER — Telehealth: Payer: Self-pay | Admitting: *Deleted

## 2013-04-16 ENCOUNTER — Ambulatory Visit (HOSPITAL_BASED_OUTPATIENT_CLINIC_OR_DEPARTMENT_OTHER): Payer: Medicare Other | Admitting: Nurse Practitioner

## 2013-04-16 VITALS — BP 100/51 | HR 104 | Temp 97.2°F | Resp 18 | Ht 62.0 in | Wt 84.0 lb

## 2013-04-16 DIAGNOSIS — C20 Malignant neoplasm of rectum: Secondary | ICD-10-CM | POA: Diagnosis not present

## 2013-04-16 DIAGNOSIS — C210 Malignant neoplasm of anus, unspecified: Secondary | ICD-10-CM

## 2013-04-16 DIAGNOSIS — E860 Dehydration: Secondary | ICD-10-CM | POA: Diagnosis not present

## 2013-04-16 DIAGNOSIS — J449 Chronic obstructive pulmonary disease, unspecified: Secondary | ICD-10-CM | POA: Diagnosis not present

## 2013-04-16 DIAGNOSIS — R634 Abnormal weight loss: Secondary | ICD-10-CM

## 2013-04-16 DIAGNOSIS — R197 Diarrhea, unspecified: Secondary | ICD-10-CM

## 2013-04-16 LAB — CLOSTRIDIUM DIFFICILE BY PCR: Toxigenic C. Difficile by PCR: NEGATIVE

## 2013-04-16 MED ORDER — SODIUM CHLORIDE 0.9 % IV SOLN
Freq: Once | INTRAVENOUS | Status: AC
  Administered 2013-04-16: 15:00:00 via INTRAVENOUS

## 2013-04-16 NOTE — Progress Notes (Addendum)
OFFICE PROGRESS NOTE  Interval history:  Ms. Sara Cochran returns for scheduled followup. She feels slightly better after the IV fluids. She states that she pushed fluids overnight. Her friend accompanying her to today's visit disagrees. She does not feel that Ms. Renteria is taking in adequate fluids. Ms. Lai reports continued diarrhea. She has had one loose stool so far today. She is taking Lomotil 2 tablets 4 times daily. She has not started Imodium. Appetite is poor.   Objective: Filed Vitals:   04/16/13 1355  BP: 100/51  Pulse: 104  Temp: 97.2 F (36.2 C)  Resp: 18   Scabbed crusted lesion at the left lower lip. Mucous membranes are dry. Decreased skin turgor. Lungs clear. Regular cardiac rhythm. Mildly tachycardic. Abdomen soft and nontender. No leg edema.   Lab Results: Lab Results  Component Value Date   WBC 4.0 04/15/2013   HGB 11.6 04/15/2013   HCT 34.6* 04/15/2013   MCV 86.9 04/15/2013   PLT 74* 04/15/2013   NEUTROABS 3.1 04/15/2013    Chemistry:    Chemistry      Component Value Date/Time   NA 127* 04/15/2013 1510   K 3.9 04/15/2013 1510   CO2 22 04/15/2013 1510   BUN 39.3* 04/15/2013 1510   BUN 14 02/03/2013 1257   CREATININE 1.0 04/15/2013 1510   CREATININE 0.62 02/03/2013 1257      Component Value Date/Time   CALCIUM 9.0 04/15/2013 1510   ALKPHOS 45 03/23/2013 1112   AST 12 03/23/2013 1112   ALT 7 03/23/2013 1112   BILITOT 0.22 03/23/2013 1112       Studies/Results: Ir Fluoro Guide Cv Line Right  03/23/2013   CLINICAL DATA:  History of anal cancer, PICC line placement for chemotherapy.  EXAM: IR RIGHT FLOURO GUIDE CV LINE; IR ULTRASOUND GUIDANCE VASC ACCESS RIGHT  TECHNIQUE: The right arm was prepped with chlorhexidine, draped in the usual sterile fashion using maximum barrier technique (cap and mask, sterile gown, sterile gloves, large sterile sheet, hand hygiene and cutaneous antiseptic). Local anesthesia was attained by infiltration with 1% lidocaine.  Ultrasound  demonstrated patency of the basilic vein, and this was documented with an image. Under real-time ultrasound guidance, this vein was accessed with a 21 gauge micropuncture needle and image documentation was performed. The needle was exchanged over a guidewire for a peel-away sheath through which a 38 cm 5 [place] single lumen power injectable PICC was advanced, and positioned with its tip at the lower SVC/right atrial junction. Fluoroscopy during the procedure and fluoro spot radiograph confirms appropriate catheter position. The catheter was flushed, secured to the skin with Prolene sutures, and covered with a sterile dressing.  FLUOROSCOPY TIME:  3 minutes, 36 seconds.  COMPLICATIONS: None.  The patient tolerated the procedure well.  IMPRESSION: Successful placement of a right arm PICC with sonographic and fluoroscopic guidance. The catheter is ready for use.  Read by: Ascencion Dike PA-C   Electronically Signed   By: Maryclare Bean M.D.   On: 03/23/2013 10:02   Ir US Guide Vasc Access Right  03/23/2013   CLINICAL DATA:  History of anal cancer, PICC line placement for chemotherapy.  EXAM: IR RIGHT FLOURO GUIDE CV LINE; IR ULTRASOUND GUIDANCE VASC ACCESS RIGHT  TECHNIQUE: The right arm was prepped with chlorhexidine, draped in the usual sterile fashion using maximum barrier technique (cap and mask, sterile gown, sterile gloves, large sterile sheet, hand hygiene and cutaneous antiseptic). Local anesthesia was attained by infiltration with 1% lidocaine.  Ultrasound demonstrated  patency of the basilic vein, and this was documented with an image. Under real-time ultrasound guidance, this vein was accessed with a 21 gauge micropuncture needle and image documentation was performed. The needle was exchanged over a guidewire for a peel-away sheath through which a 38 cm 5 [place] single lumen power injectable PICC was advanced, and positioned with its tip at the lower SVC/right atrial junction. Fluoroscopy during the procedure and  fluoro spot radiograph confirms appropriate catheter position. The catheter was flushed, secured to the skin with Prolene sutures, and covered with a sterile dressing.  FLUOROSCOPY TIME:  3 minutes, 36 seconds.  COMPLICATIONS: None.  The patient tolerated the procedure well.  IMPRESSION: Successful placement of a right arm PICC with sonographic and fluoroscopic guidance. The catheter is ready for use.  Read by: Ascencion Dike PA-C   Electronically Signed   By: Maryclare Bean M.D.   On: 03/23/2013 10:02    Medications: I have reviewed the patient's current medications.  Assessment/Plan: 1. 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 Initiation of radiation and cycle 1 5-FU/mitomycin C. 02/23/2013, cycle 2 03/23/2013. Completed radiation 04/06/2013. 2. CT evidence of a perirectal abscess. 3. COPD. 4. "Sore throat" 02/26/2013. Resolved. 5. History of thrombocytopenia/neutropenia secondary to chemotherapy.  6. Hypokalemia 03/23/2013. She is on a potassium supplement. Potassium level was normal on 04/15/2013. 7.  History of Pain secondary to radiation skin breakdown at the perineum. 8. Weight loss.  9. Diarrhea. Likely secondary to radiation. Stool negative for C. difficile 04/15/2013. 10. Dehydration status post IV fluids 04/15/2013.  Disposition-she continues to appear dehydrated. She will receive a liter of IV fluids today and tomorrow. She will try to push fluids by mouth as well.   The [facility] dietitian is scheduled to meet with her today.  She has persistent diarrhea which is likely secondary to radiation. Stool was negative for C. difficile 04/15/2013. She will continue Lomotil 2 tablets 4 times daily and will begin Imodium 2 mg after each loose stool to a maximum of 8 tabs per day.  We will see her in followup in one week. She or her family will contact the office in the interim with any problems.   Patient seen with  Dr. Benay Spice. 25 minutes were spent face-to-face at today's visit with the majority of that time involved in counseling/coordination of care.    Ned Card ANP/GNP-BC   This was a shared visit with Ned Card. She has persistent diarrhea and failure to thrive after completing treatment for SCCA of the distal rectum.  She will receive IVFs today and again on 1/30.    Julieanne Manson, MD

## 2013-04-16 NOTE — Progress Notes (Signed)
Spoke with patient in chemotherapy while she was receiving IV fluids.  Patient states she has no appetite.  In the past 2 days, she has only eaten one egg.  She complains of frequent diarrhea when she does eat or drink.  Patient is alone during the day and is unable to clean up after herself after a diarrheal episode.  Patient admits this is part of the reason she is not eating or drinking.  Weight was documented as 84 pounds on January 29.  This is decreased from 101.7 pounds December 12.  This is a 17% weight loss in 6 weeks.  Patient is underweight with a BMI of 15.36.  She is cachectic in appearance.  Nutrition diagnosis: Unintended weight loss continues.  Intervention: Patient educated on the importance of increasing oral fluids as well as oral intake of calories and protein.  Patient educated to drink Ensure Plus twice a day, during the day when she is alone.  Family will leave at bedside for her.  Patient then encouraged to consume dinner after family provides this for her.  Patient tolerated one bottle of ensure complete while receiving IV fluids.  She has no nausea and did not complain of other nutrition issues.   Monitoring, evaluation, goals: Patient agrees to drink Ensure Plus twice a day and try to eat a little bit of dinner every night.  Will monitor patient's weight and compliance with oral intake.  Next visit: Will continue to follow patient as needed.

## 2013-04-16 NOTE — Telephone Encounter (Signed)
Called and informed patient not to take her blood pressure medicine today. Also, informed her she could come in today at 1:45 if she wanted to.  Per Elby Showers. Marcello Moores, NP.  Patient verbalized understanding.

## 2013-04-16 NOTE — Patient Instructions (Signed)
Dehydration, Adult Dehydration is when you lose more fluids from the body than you take in. Vital organs like the kidneys, brain, and heart cannot function without a proper amount of fluids and salt. Any loss of fluids from the body can cause dehydration.  CAUSES   Vomiting.  Diarrhea.  Excessive sweating.  Excessive urine output.  Fever. SYMPTOMS  Mild dehydration  Thirst.  Dry lips.  Slightly dry mouth. Moderate dehydration  Very dry mouth.  Sunken eyes.  Skin does not bounce back quickly when lightly pinched and released.  Dark urine and decreased urine production.  Decreased tear production.  Headache. Severe dehydration  Very dry mouth.  Extreme thirst.  Rapid, weak pulse (more than 100 beats per minute at rest).  Cold hands and feet.  Not able to sweat in spite of heat and temperature.  Rapid breathing.  Blue lips.  Confusion and lethargy.  Difficulty being awakened.  Minimal urine production.  No tears. DIAGNOSIS  Your caregiver will diagnose dehydration based on your symptoms and your exam. Blood and urine tests will help confirm the diagnosis. The diagnostic evaluation should also identify the cause of dehydration. TREATMENT  Treatment of mild or moderate dehydration can often be done at home by increasing the amount of fluids that you drink. It is best to drink small amounts of fluid more often. Drinking too much at one time can make vomiting worse. Refer to the home care instructions below. Severe dehydration needs to be treated at the hospital where you will probably be given intravenous (IV) fluids that contain water and electrolytes. HOME CARE INSTRUCTIONS   Ask your caregiver about specific rehydration instructions.  Drink enough fluids to keep your urine clear or pale yellow.  Drink small amounts frequently if you have nausea and vomiting.  Eat as you normally do.  Avoid:  Foods or drinks high in sugar.  Carbonated  drinks.  Juice.  Extremely hot or cold fluids.  Drinks with caffeine.  Fatty, greasy foods.  Alcohol.  Tobacco.  Overeating.  Gelatin desserts.  Wash your hands well to avoid spreading bacteria and viruses.  Only take over-the-counter or prescription medicines for pain, discomfort, or fever as directed by your caregiver.  Ask your caregiver if you should continue all prescribed and over-the-counter medicines.  Keep all follow-up appointments with your caregiver. SEEK MEDICAL CARE IF:  You have abdominal pain and it increases or stays in one area (localizes).  You have a rash, stiff neck, or severe headache.  You are irritable, sleepy, or difficult to awaken.  You are weak, dizzy, or extremely thirsty. SEEK IMMEDIATE MEDICAL CARE IF:   You are unable to keep fluids down or you get worse despite treatment.  You have frequent episodes of vomiting or diarrhea.  You have blood or green matter (bile) in your vomit.  You have blood in your stool or your stool looks black and tarry.  You have not urinated in 6 to 8 hours, or you have only urinated a small amount of very dark urine.  You have a fever.  You faint. MAKE SURE YOU:   Understand these instructions.  Will watch your condition.  Will get help right away if you are not doing well or get worse. Document Released: 03/05/2005 Document Revised: 05/28/2011 Document Reviewed: 10/23/2010 [employer] Patient Information 2014 [employer].  

## 2013-04-17 ENCOUNTER — Other Ambulatory Visit: Payer: Self-pay | Admitting: Medical Oncology

## 2013-04-17 ENCOUNTER — Telehealth: Payer: Self-pay | Admitting: Oncology

## 2013-04-17 ENCOUNTER — Other Ambulatory Visit: Payer: Self-pay | Admitting: Nurse Practitioner

## 2013-04-17 ENCOUNTER — Ambulatory Visit (HOSPITAL_BASED_OUTPATIENT_CLINIC_OR_DEPARTMENT_OTHER): Payer: Medicare Other

## 2013-04-17 VITALS — BP 100/66 | HR 112 | Temp 98.5°F | Resp 18

## 2013-04-17 DIAGNOSIS — E860 Dehydration: Secondary | ICD-10-CM | POA: Diagnosis not present

## 2013-04-17 DIAGNOSIS — C20 Malignant neoplasm of rectum: Secondary | ICD-10-CM

## 2013-04-17 DIAGNOSIS — C210 Malignant neoplasm of anus, unspecified: Secondary | ICD-10-CM

## 2013-04-17 MED ORDER — SODIUM CHLORIDE 0.9 % IV SOLN: Freq: Once | INTRAVENOUS | Status: DC

## 2013-04-17 MED ORDER — SODIUM CHLORIDE 0.9 % IV SOLN
1000.0000 mL | INTRAVENOUS | Status: DC
  Administered 2013-04-17: 14:00:00 via INTRAVENOUS

## 2013-04-17 NOTE — Patient Instructions (Signed)
Dehydration, Elderly Dehydration means your body does not have as much fluid as it needs. Your kidneys, brain, and heart will not work properly without the right amount of fluids and salt. Older adults are more likely to become dehydrated than younger adults. This is because:   Their bodies do not hold water as well.  Their bodies do not respond to temperature changes as well.  They do not get thirsty as easily or as quickly. HOME CARE  Ask your doctor how to replace body fluid losses (rehydrate).  Drink enough fluids to keep your pee (urine) clear or pale yellow.  Drink small amounts of fluids often if you feel sick to your stomach (nauseous) or throw up (vomit).  Eat like you normally do.  Avoid:  Foods or drinks high in sugar.  Bubbly (carbonated) drinks.  Juice.  Very hot or cold fluids.  Drinks with caffeine.  Fatty, greasy foods.  Alcohol.  Tobacco.  Eating too much.  Gelatin desserts.  Wash your hands to avoid spreading germs (bacteria, viruses).  Only take medicine as told by your doctor.  Keep all doctor visits as told. GET HELP IF:  You have belly (abdominal) pain that gets worse or stays in one spot (localizes).  You have a rash, stiff neck, or bad headache.  You get easily annoyed, sleepy, or are hard to wake up.  You feel weak, dizzy, or very thirsty. GET HELP RIGHT AWAY IF:   You cannot drink fluid without throwing up.  You get worse even with treatment.  You throw up often.  You have watery poop (diarrhea) often.  Your vomit has blood in it or looks greenish.  Your poop (stool) has blood in it or looks black and tarry.  You have not peed in 6 to 8 hours or have only peed a small amount of very dark pee.  You have a fever.  You pass out (faint). MAKE SURE YOU:   Understand these instructions.  Will watch your condition.  Will get help right away if you are not doing well or get worse. Document Released: 02/22/2011 Document  Revised: 12/24/2012 Document Reviewed: 11/10/2012 [employer] Patient Information 2014 [employer].  

## 2013-04-17 NOTE — Telephone Encounter (Signed)
s.w. pt and advised on Feb appt.Marland KitchenMarland Kitchenpt will get sched at todays visit

## 2013-04-23 ENCOUNTER — Ambulatory Visit (HOSPITAL_BASED_OUTPATIENT_CLINIC_OR_DEPARTMENT_OTHER): Payer: Medicare Other | Admitting: Nurse Practitioner

## 2013-04-23 ENCOUNTER — Encounter (HOSPITAL_COMMUNITY): Payer: Self-pay

## 2013-04-23 ENCOUNTER — Ambulatory Visit (HOSPITAL_BASED_OUTPATIENT_CLINIC_OR_DEPARTMENT_OTHER): Payer: Medicare Other

## 2013-04-23 ENCOUNTER — Other Ambulatory Visit: Payer: Medicare Other

## 2013-04-23 ENCOUNTER — Inpatient Hospital Stay (HOSPITAL_COMMUNITY): Payer: Medicare Other

## 2013-04-23 ENCOUNTER — Inpatient Hospital Stay (HOSPITAL_COMMUNITY)
Admission: AD | Admit: 2013-04-23 | Discharge: 2013-05-01 | DRG: 871 | Disposition: A | Discharge status: Home Health | Payer: Medicare Other | Source: Ambulatory Visit | Attending: Internal Medicine | Admitting: Internal Medicine

## 2013-04-23 VITALS — BP 139/84 | HR 127 | Temp 94.5°F | Ht 62.0 in | Wt 81.8 lb

## 2013-04-23 DIAGNOSIS — R627 Adult failure to thrive: Secondary | ICD-10-CM | POA: Diagnosis present

## 2013-04-23 DIAGNOSIS — K529 Noninfective gastroenteritis and colitis, unspecified: Secondary | ICD-10-CM

## 2013-04-23 DIAGNOSIS — I82409 Acute embolism and thrombosis of unspecified deep veins of unspecified lower extremity: Secondary | ICD-10-CM

## 2013-04-23 DIAGNOSIS — I10 Essential (primary) hypertension: Secondary | ICD-10-CM

## 2013-04-23 DIAGNOSIS — F22 Delusional disorders: Secondary | ICD-10-CM

## 2013-04-23 DIAGNOSIS — E860 Dehydration: Secondary | ICD-10-CM

## 2013-04-23 DIAGNOSIS — D638 Anemia in other chronic diseases classified elsewhere: Secondary | ICD-10-CM

## 2013-04-23 DIAGNOSIS — E871 Hypo-osmolality and hyponatremia: Secondary | ICD-10-CM | POA: Diagnosis present

## 2013-04-23 DIAGNOSIS — J4489 Other specified chronic obstructive pulmonary disease: Secondary | ICD-10-CM | POA: Diagnosis present

## 2013-04-23 DIAGNOSIS — Z87891 Personal history of nicotine dependence: Secondary | ICD-10-CM

## 2013-04-23 DIAGNOSIS — B3749 Other urogenital candidiasis: Secondary | ICD-10-CM

## 2013-04-23 DIAGNOSIS — K520 Gastroenteritis and colitis due to radiation: Secondary | ICD-10-CM | POA: Diagnosis present

## 2013-04-23 DIAGNOSIS — I82629 Acute embolism and thrombosis of deep veins of unspecified upper extremity: Secondary | ICD-10-CM | POA: Diagnosis present

## 2013-04-23 DIAGNOSIS — T68XXXA Hypothermia, initial encounter: Secondary | ICD-10-CM | POA: Diagnosis present

## 2013-04-23 DIAGNOSIS — R197 Diarrhea, unspecified: Secondary | ICD-10-CM

## 2013-04-23 DIAGNOSIS — N390 Urinary tract infection, site not specified: Secondary | ICD-10-CM | POA: Diagnosis present

## 2013-04-23 DIAGNOSIS — R5383 Other fatigue: Secondary | ICD-10-CM

## 2013-04-23 DIAGNOSIS — T451X5A Adverse effect of antineoplastic and immunosuppressive drugs, initial encounter: Secondary | ICD-10-CM | POA: Diagnosis present

## 2013-04-23 DIAGNOSIS — D709 Neutropenia, unspecified: Secondary | ICD-10-CM | POA: Diagnosis not present

## 2013-04-23 DIAGNOSIS — E876 Hypokalemia: Secondary | ICD-10-CM

## 2013-04-23 DIAGNOSIS — E43 Unspecified severe protein-calorie malnutrition: Secondary | ICD-10-CM

## 2013-04-23 DIAGNOSIS — M79609 Pain in unspecified limb: Secondary | ICD-10-CM | POA: Diagnosis not present

## 2013-04-23 DIAGNOSIS — D61810 Antineoplastic chemotherapy induced pancytopenia: Secondary | ICD-10-CM | POA: Diagnosis present

## 2013-04-23 DIAGNOSIS — J449 Chronic obstructive pulmonary disease, unspecified: Secondary | ICD-10-CM | POA: Diagnosis present

## 2013-04-23 DIAGNOSIS — M7989 Other specified soft tissue disorders: Secondary | ICD-10-CM | POA: Diagnosis not present

## 2013-04-23 DIAGNOSIS — C210 Malignant neoplasm of anus, unspecified: Secondary | ICD-10-CM

## 2013-04-23 DIAGNOSIS — F411 Generalized anxiety disorder: Secondary | ICD-10-CM | POA: Diagnosis present

## 2013-04-23 DIAGNOSIS — C211 Malignant neoplasm of anal canal: Secondary | ICD-10-CM | POA: Diagnosis present

## 2013-04-23 DIAGNOSIS — C20 Malignant neoplasm of rectum: Secondary | ICD-10-CM

## 2013-04-23 DIAGNOSIS — R05 Cough: Secondary | ICD-10-CM | POA: Diagnosis not present

## 2013-04-23 DIAGNOSIS — R5381 Other malaise: Secondary | ICD-10-CM | POA: Diagnosis present

## 2013-04-23 DIAGNOSIS — Y842 Radiological procedure and radiotherapy as the cause of abnormal reaction of the patient, or of later complication, without mention of misadventure at the time of the procedure: Secondary | ICD-10-CM | POA: Diagnosis present

## 2013-04-23 DIAGNOSIS — D696 Thrombocytopenia, unspecified: Secondary | ICD-10-CM | POA: Diagnosis present

## 2013-04-23 DIAGNOSIS — D61818 Other pancytopenia: Secondary | ICD-10-CM | POA: Diagnosis not present

## 2013-04-23 DIAGNOSIS — D649 Anemia, unspecified: Secondary | ICD-10-CM | POA: Diagnosis present

## 2013-04-23 DIAGNOSIS — Z681 Body mass index (BMI) 19 or less, adult: Secondary | ICD-10-CM

## 2013-04-23 DIAGNOSIS — F4489 Other dissociative and conversion disorders: Secondary | ICD-10-CM

## 2013-04-23 DIAGNOSIS — A419 Sepsis, unspecified organism: Principal | ICD-10-CM

## 2013-04-23 DIAGNOSIS — K5289 Other specified noninfective gastroenteritis and colitis: Secondary | ICD-10-CM | POA: Diagnosis not present

## 2013-04-23 DIAGNOSIS — R059 Cough, unspecified: Secondary | ICD-10-CM | POA: Diagnosis not present

## 2013-04-23 LAB — CBC WITH DIFFERENTIAL/PLATELET
BASOS PCT: 0 % (ref 0–1)
Basophils Absolute: 0 10*3/uL (ref 0.0–0.1)
Eosinophils Absolute: 0 10*3/uL (ref 0.0–0.7)
Eosinophils Relative: 1 % (ref 0–5)
HEMATOCRIT: 27.6 % — AB (ref 36.0–46.0)
Hemoglobin: 9.1 g/dL — ABNORMAL LOW (ref 12.0–15.0)
LYMPHS PCT: 6 % — AB (ref 12–46)
Lymphs Abs: 0.1 10*3/uL — ABNORMAL LOW (ref 0.7–4.0)
MCH: 29.4 pg (ref 26.0–34.0)
MCHC: 33 g/dL (ref 30.0–36.0)
MCV: 89.3 fL (ref 78.0–100.0)
Monocytes Absolute: 0.2 10*3/uL (ref 0.1–1.0)
Monocytes Relative: 11 % (ref 3–12)
NEUTROS ABS: 1.6 10*3/uL — AB (ref 1.7–7.7)
Neutrophils Relative %: 82 % — ABNORMAL HIGH (ref 43–77)
Platelets: 53 10*3/uL — ABNORMAL LOW (ref 150–400)
RBC: 3.09 MIL/uL — ABNORMAL LOW (ref 3.87–5.11)
RDW: 15.5 % (ref 11.5–15.5)
WBC Morphology: INCREASED
WBC: 1.9 10*3/uL — ABNORMAL LOW (ref 4.0–10.5)

## 2013-04-23 LAB — COMPREHENSIVE METABOLIC PANEL
ALT: 9 U/L (ref 0–35)
AST: 12 U/L (ref 0–37)
Albumin: 1.8 g/dL — ABNORMAL LOW (ref 3.5–5.2)
Alkaline Phosphatase: 47 U/L (ref 39–117)
BILIRUBIN TOTAL: 0.5 mg/dL (ref 0.3–1.2)
BUN: 28 mg/dL — ABNORMAL HIGH (ref 6–23)
CHLORIDE: 88 meq/L — AB (ref 96–112)
CO2: 29 mEq/L (ref 19–32)
Calcium: 7.8 mg/dL — ABNORMAL LOW (ref 8.4–10.5)
Creatinine, Ser: 0.67 mg/dL (ref 0.50–1.10)
GFR calc Af Amer: >90 mL/min (ref >90)
GFR calc non Af Amer: 83 mL/min — ABNORMAL LOW (ref >90)
Glucose, Bld: 129 mg/dL — ABNORMAL HIGH (ref 70–99)
Potassium: 3.8 mEq/L (ref 3.7–5.3)
SODIUM: 132 meq/L — AB (ref 137–147)
Total Protein: 5.5 g/dL — ABNORMAL LOW (ref 6.0–8.3)

## 2013-04-23 LAB — PROTIME-INR
INR: 1.66 — ABNORMAL HIGH (ref 0.00–1.49)
PROTHROMBIN TIME: 19.1 s — AB (ref 11.6–15.2)

## 2013-04-23 LAB — MAGNESIUM: Magnesium: 1.7 mg/dL (ref 1.5–2.5)

## 2013-04-23 LAB — APTT: aPTT: 38 seconds — ABNORMAL HIGH (ref 24–37)

## 2013-04-23 LAB — PHOSPHORUS: PHOSPHORUS: 4.3 mg/dL (ref 2.3–4.6)

## 2013-04-23 MED ORDER — LOPERAMIDE HCL 2 MG PO CAPS: 2.0000 mg | ORAL_CAPSULE | ORAL | Status: DC | PRN

## 2013-04-23 MED ORDER — PROCHLORPERAZINE MALEATE 5 MG PO TABS: 5.0000 mg | ORAL_TABLET | Freq: Four times a day (QID) | ORAL | Status: DC | PRN

## 2013-04-23 MED ORDER — ACETAMINOPHEN 325 MG PO TABS: 650.0000 mg | ORAL_TABLET | Freq: Four times a day (QID) | ORAL | Status: DC | PRN

## 2013-04-23 MED ORDER — IPRATROPIUM-ALBUTEROL 0.5-2.5 (3) MG/3ML IN SOLN: 3.0000 mL | RESPIRATORY_TRACT | Status: DC | PRN

## 2013-04-23 MED ORDER — ONDANSETRON HCL 4 MG/2ML IJ SOLN: 4.0000 mg | Freq: Four times a day (QID) | INTRAMUSCULAR | Status: DC | PRN

## 2013-04-23 MED ORDER — PIPERACILLIN-TAZOBACTAM 3.375 G IVPB
3.3750 g | Freq: Three times a day (TID) | INTRAVENOUS | Status: DC
  Administered 2013-04-24 – 2013-04-28 (×12): 3.375 g via INTRAVENOUS
  Filled 2013-04-23 (×14): qty 50

## 2013-04-23 MED ORDER — MORPHINE SULFATE 2 MG/ML IJ SOLN: 1.0000 mg | INTRAMUSCULAR | Status: DC | PRN

## 2013-04-23 MED ORDER — AMLODIPINE BESYLATE 5 MG PO TABS: 5.0000 mg | ORAL_TABLET | Freq: Every day | ORAL | Status: DC

## 2013-04-23 MED ORDER — HYDROCODONE-ACETAMINOPHEN 5-325 MG PO TABS: 1.0000 | ORAL_TABLET | ORAL | Status: DC | PRN

## 2013-04-23 MED ORDER — ALBUTEROL SULFATE HFA 108 (90 BASE) MCG/ACT IN AERS: 2.0000 | INHALATION_SPRAY | Freq: Four times a day (QID) | RESPIRATORY_TRACT | Status: DC | PRN

## 2013-04-23 MED ORDER — ALPRAZOLAM 1 MG PO TABS: 1.0000 mg | ORAL_TABLET | Freq: Every evening | ORAL | Status: DC | PRN

## 2013-04-23 MED ORDER — SODIUM CHLORIDE 0.9 % IV SOLN
INTRAVENOUS | Status: DC
  Administered 2013-04-23: 14:00:00 via INTRAVENOUS

## 2013-04-23 MED ORDER — SODIUM CHLORIDE 0.9 % IV SOLN
INTRAVENOUS | Status: DC
  Administered 2013-04-23: 16:00:00 via INTRAVENOUS
  Administered 2013-04-24 (×2): 75 mL/h via INTRAVENOUS
  Administered 2013-04-26 – 2013-04-28 (×2): via INTRAVENOUS

## 2013-04-23 MED ORDER — IPRATROPIUM-ALBUTEROL 0.5-2.5 (3) MG/3ML IN SOLN
3.0000 mL | Freq: Four times a day (QID) | RESPIRATORY_TRACT | Status: DC | PRN
  Administered 2013-04-30: 3 mL via RESPIRATORY_TRACT
  Filled 2013-04-23: qty 3

## 2013-04-23 MED ORDER — VANCOMYCIN HCL IN DEXTROSE 750-5 MG/150ML-% IV SOLN
750.0000 mg | INTRAVENOUS | Status: DC
  Administered 2013-04-23 – 2013-04-26 (×4): 750 mg via INTRAVENOUS
  Filled 2013-04-23 (×4): qty 150

## 2013-04-23 MED ORDER — PANTOPRAZOLE SODIUM 40 MG PO TBEC
40.0000 mg | DELAYED_RELEASE_TABLET | Freq: Every day | ORAL | Status: DC
  Administered 2013-04-23 – 2013-05-01 (×9): 40 mg via ORAL
  Filled 2013-04-23 (×8): qty 1

## 2013-04-23 MED ORDER — IRBESARTAN 300 MG PO TABS: 300.0000 mg | ORAL_TABLET | Freq: Every day | ORAL | Status: DC

## 2013-04-23 MED ORDER — ESTRADIOL 2 MG PO TABS
2.0000 mg | ORAL_TABLET | Freq: Every day | ORAL | Status: DC
  Administered 2013-04-23 – 2013-04-24 (×2): 2 mg via ORAL
  Filled 2013-04-23 (×9): qty 1

## 2013-04-23 MED ORDER — ONDANSETRON HCL 4 MG PO TABS
4.0000 mg | ORAL_TABLET | Freq: Four times a day (QID) | ORAL | Status: DC | PRN
Start: 2013-04-23 — End: 2013-05-01

## 2013-04-23 MED ORDER — PIPERACILLIN-TAZOBACTAM 3.375 G IVPB
3.3750 g | INTRAVENOUS | Status: AC
  Administered 2013-04-23: 3.375 g via INTRAVENOUS
  Filled 2013-04-23: qty 50

## 2013-04-23 MED ORDER — ACETAMINOPHEN 650 MG RE SUPP: 650.0000 mg | Freq: Four times a day (QID) | RECTAL | Status: DC | PRN

## 2013-04-23 MED ORDER — DIPHENOXYLATE-ATROPINE 2.5-0.025 MG PO TABS
2.0000 | ORAL_TABLET | Freq: Four times a day (QID) | ORAL | Status: DC | PRN
  Administered 2013-04-23 – 2013-04-29 (×2): 2 via ORAL
  Filled 2013-04-23 (×2): qty 2

## 2013-04-23 MED ORDER — SIMVASTATIN 20 MG PO TABS
20.0000 mg | ORAL_TABLET | Freq: Every day | ORAL | Status: DC
  Administered 2013-04-23 – 2013-04-30 (×8): 20 mg via ORAL
  Filled 2013-04-23 (×9): qty 1

## 2013-04-23 NOTE — H&P (Signed)
[facility] History and Physical  Sara Cochran QGB:[id number] DOB: 10-Oct-1933 DOA: 04/23/2013  Referring physician: ER physician PCP: Fredirick Maudlin, MD   Chief Complaint: dehydration  HPI:  80 year old female with past medical history of squamous cell carcinoma of anal region, undergoing chemotherapy (under Dr. Kalman Drape care) and underwent RT who presented as a direct admission from cancer center due to severe malnutrition and dehydration. Pt reported feeling extremely weak and per family having intermittent confusions. Pt also reported having loose stools, abdominal discomfort and nausea and vomiting.  This is a direct adm so blood work not yet available.  Assessment and Plan:  Principal Problem: Dehydration, failure to thrive - likely due to malignancy, poor oral intake - continue supportive care with IV fluids - will need PT eval once able to participate - nutrition consulted   Active Problems: Squamous cell cancer, anal region - management per oncology - underwent RT, completed 04/06/2013 - cycle 2 chemo 03/23/2013 Perirectal abscess - seen on on CT abdomen 01/22/2013.. Will hydrate and eval in am if she can tolerate CT abd with contrast for further eval Possible sepsis - low temp on admission, 94.5 F - will eval with CT perirectal abscess - on broad spectrum antibiotics, vanco and zosyn; Dr. Truett Perna agrees with abx - follow up blood culutres, urine culture, CXR Neutropenia - no fever but low temp, 94.5 F and WBC count 1.9 - on vanco and zosyn - obtained CXR, blood cultures, urine culture Thrombocytopenia - likely sequela of chemotherapy - platelets 53 on this admission   Code Status: Full Family Communication: Pt at bedside Disposition Plan: Admit for further evaluation  Manson Passey, MD  [facility] Pager [phone]  Review of Systems:  Constitutional: Negative for fever, chills and malaise/fatigue. Negative for diaphoresis.  HENT: Negative for  hearing loss, ear pain, nosebleeds, congestion, sore throat, neck pain, tinnitus and ear discharge.   Eyes: Negative for blurred vision, double vision, photophobia, pain, discharge and redness.  Respiratory: Negative for cough, hemoptysis, sputum production, shortness of breath, wheezing and stridor.   Cardiovascular: Negative for chest pain, palpitations, orthopnea, claudication and leg swelling.  Gastrointestinal: has diarrhea, nausea, no blood in stool Genitourinary: Negative for dysuria, urgency, frequency, hematuria and flank pain.  Musculoskeletal: positive for myalgias, no back pain, joint pain and falls.  Skin: Negative for itching and rash.  Neurological: Negative for dizziness and positive for weakness. Negative for tingling, tremors, sensory change, speech change, focal weakness, loss of consciousness and headaches.  Endo/Heme/Allergies: Negative for environmental allergies and polydipsia. Does not bruise/bleed easily.  Psychiatric/Behavioral: Negative for suicidal ideas. The patient is not nervous/anxious.      Past Medical History  Diagnosis Date  . COPD (chronic obstructive pulmonary disease)   . High cholesterol   . Acid reflux   . Back pain   . Migraine   . Weight loss     1 year 25 lbs  . Allergy   . Perirectal abscess   . Hypertension   . Cancer 01/21/13 bx     rectal cancer=invasive squamous cell ca   Past Surgical History  Procedure Laterality Date  . Cesarean section    . Abdominal hysterectomy    . Cyst removal hand      3 removed from right hand  . Colonoscopy w/ polypectomy  01/23/2001    polypoid colonic mucosa,no adenomatous change or malignancy identified  . Rectal biopsy  01/21/2013    invasive squamous cell ca,mod to poorly differentiatiated  Social History:  reports that she quit smoking about 16 years ago. She has never used smokeless tobacco. She reports that she does not drink alcohol or use illicit drugs.  Allergies  Allergen Reactions  .  Aspirin Shortness Of Breath and Swelling  . Erythromycin Hives and Rash  . Niacin And Related Other (See Comments)    Redness all over     Family History: Family medical history significant for HTN, HLD  Prior to Admission medications   Medication Sig Start Date End Date Taking? Authorizing Provider  albuterol (PROVENTIL HFA;VENTOLIN HFA) 108 (90 BASE) MCG/ACT inhaler Inhale 2 puffs into the lungs every 6 (six) hours as needed.    Historical Provider, MD  ALPRAZolam Duanne Moron) 1 MG tablet Take 1 mg by mouth at bedtime as needed.    Historical Provider, MD  amLODipine (NORVASC) 5 MG tablet Take 5 mg by mouth daily.  12/15/12   Historical Provider, MD  diphenoxylate-atropine (LOMOTIL) 2.5-0.025 MG per tablet Take 2 tablets by mouth 4 (four) times daily as needed for diarrhea or loose stools. 04/07/13   Rexene Edison, MD  estradiol (ESTRACE) 2 MG tablet Take 2 mg by mouth daily.  12/16/12   Historical Provider, MD  HYDROcodone-acetaminophen (LORCET) 10-650 MG per tablet Take 1 tablet by mouth every 6 (six) hours as needed.    Historical Provider, MD  ipratropium-albuterol (DUONEB) 0.5-2.5 (3) MG/3ML SOLN Take 3 mLs by nebulization.    Historical Provider, MD  lidocaine (XYLOCAINE) 5 % ointment Apply 1 application topically as needed.    Historical Provider, MD  loperamide (IMODIUM) 2 MG capsule Take 2 mg by mouth as needed for diarrhea or loose stools.    Historical Provider, MD  olmesartan (BENICAR) 40 MG tablet Take 40 mg by mouth daily.    Historical Provider, MD  omeprazole (PRILOSEC) 20 MG capsule Take 20 mg by mouth daily.    Historical Provider, MD  potassium chloride SA (K-DUR,KLOR-CON) 20 MEQ tablet Take 1 tablet (20 mEq total) by mouth daily. 03/23/13   Owens Shark, NP  pravastatin (PRAVACHOL) 40 MG tablet Take 40 mg by mouth daily.    Historical Provider, MD  prochlorperazine (COMPAZINE) 5 MG tablet Take 1 tablet (5 mg total) by mouth every 6 (six) hours as needed for nausea or vomiting.  02/23/13   Ladell Pier, MD   Physical Exam: Filed Vitals:   04/23/13 1600  BP: 141/90  Pulse: 116  Temp: 97.8 F (36.6 C)  TempSrc: Oral  Resp: 18  Height: 5\' 2"  (1.575 m)  Weight: 37.104 kg (81 lb 12.8 oz)  SpO2: 94%    Physical Exam  Constitutional: Appears frail,malnourished HENT: Normocephalic. Dry mucus membranes  Eyes: Conjunctivae and EOM are normal. PERRLA, no scleral icterus.  Neck: Normal ROM. Neck supple. No JVD.  CVS: RRR, S1/S2 +, no murmurs, no gallops, no carotid bruit.  Pulmonary: Effort and breath sounds normal, no stridor Abdominal: Soft. BS +,  no distension, tenderness, rebound or guarding.  Musculoskeletal: No edema and no tenderness.  Lymphadenopathy: No lymphadenopathy noted, cervical, inguinal. Neuro: Alert. No focal neurologic deficits  Skin: Skin is warm and dry. Sacral erythema Psychiatric: Normal mood and affect. Behavior, judgment, thought content normal.   Labs on Admission:  Basic Metabolic Panel: No results found for this basename: NA, K, CL, CO2, GLUCOSE, BUN, CREATININE, CALCIUM, MG, PHOS,  in the last 168 hours Liver Function Tests: No results found for this basename: AST, ALT, ALKPHOS, BILITOT, PROT, ALBUMIN,  in the last 168 hours No results found for this basename: LIPASE, AMYLASE,  in the last 168 hours No results found for this basename: AMMONIA,  in the last 168 hours CBC: No results found for this basename: WBC, NEUTROABS, HGB, HCT, MCV, PLT,  in the last 168 hours Cardiac Enzymes: No results found for this basename: CKTOTAL, CKMB, CKMBINDEX, TROPONINI,  in the last 168 hours BNP: No components found with this basename: POCBNP,  CBG: No results found for this basename: GLUCAP,  in the last 168 hours  If 7PM-7AM, please contact night-coverage [email or web address] Password [email or web address] 04/23/2013, 4:06 PM

## 2013-04-23 NOTE — Progress Notes (Signed)
ANTIBIOTIC CONSULT NOTE - INITIAL  Pharmacy Consult for Vancomycin and Zosyn Indication: rule out sepsis  Allergies  Allergen Reactions  . Aspirin Shortness Of Breath and Swelling  . Erythromycin Hives and Rash  . Niacin And Related Other (See Comments)    Redness all over     Patient Measurements: Height: 5\' 2"  (157.5 cm) Weight: 81 lb 12.8 oz (37.104 kg) IBW/kg (Calculated) : 50.1   Vital Signs: Temp: 97.8 F (36.6 C) (02/05 1600) Temp src: Oral (02/05 1600) BP: 141/90 mmHg (02/05 1600) Pulse Rate: 116 (02/05 1600) Intake/Output from previous day:   Intake/Output from this shift:    Labs:  Recent Labs  04/23/13 1650  WBC 1.9*  HGB 9.1*  PLT 53*  CREATININE 0.67   Estimated Creatinine Clearance: 34.5 ml/min (by C-G formula based on Cr of 0.67). No results found for this basename: VANCOTROUGH, Corlis Leak, VANCORANDOM, GENTTROUGH, GENTPEAK, GENTRANDOM, TOBRATROUGH, TOBRAPEAK, TOBRARND, AMIKACINPEAK, AMIKACINTROU, AMIKACIN,  in the last 72 hours   Microbiology: Recent Results (from the past 720 hour(s))  TECHNOLOGIST REVIEW     Status: None   Collection Time    04/06/13  2:18 PM      Result Value Range Status   Technologist Review Rare Myelocyte. Toxic Gran with Vacuoles   Final  TECHNOLOGIST REVIEW     Status: None   Collection Time    04/15/13  1:12 PM      Result Value Range Status   Technologist Review     Final   Value: 1% myelocytes, toxic granulation and vacuolated neutrophils present  CLOSTRIDIUM DIFFICILE BY PCR     Status: None   Collection Time    04/15/13  4:38 PM      Result Value Range Status   C difficile by pcr Negative  Negative Final   Comment:  This assay detects the presence of Clostridium difficile DNA codingfor toxin B (tcdB) by real-time polymerase chain reaction (PCR)amplification.This test was developed and its performance characteristics have beendetermined by [employer].      Performance characteristics referto the  analytical performance of the test. This test has not beencleared or approved by the [place] Food and Drug Administration. The FDAhas determined that such clearance or approval is not necessary. Thislaboratory is      certified under the [facility] [place] as qualified to perform high complexity clinicallaboratory testing.    Medical History: Past Medical History  Diagnosis Date  . COPD (chronic obstructive pulmonary disease)   . High cholesterol   . Acid reflux   . Back pain   . Migraine   . Weight loss     1 year 25 lbs  . Allergy   . Perirectal abscess   . Hypertension   . Cancer 01/21/13 bx     rectal cancer=invasive squamous cell ca    Medications:  Prescriptions prior to admission  Medication Sig Dispense Refill  . albuterol (PROVENTIL HFA;VENTOLIN HFA) 108 (90 BASE) MCG/ACT inhaler Inhale 2 puffs into the lungs every 6 (six) hours as needed for wheezing or shortness of breath.      . ALPRAZolam (XANAX) 1 MG tablet Take 1 mg by mouth at bedtime as needed for sleep.      Marland Kitchen amLODipine (NORVASC) 5 MG tablet Take 5 mg by mouth daily.       . diphenoxylate-atropine (LOMOTIL) 2.5-0.025 MG per tablet Take 2 tablets by mouth 4 (four) times daily as needed for diarrhea or loose stools.  40 tablet  0  . estradiol (ESTRACE) 2 MG tablet Take 2 mg by mouth daily.       Marland Kitchen HYDROcodone-acetaminophen (LORCET 10/650) 10-650 MG per tablet Take 1 tablet by mouth every 6 (six) hours as needed for pain.      Marland Kitchen ipratropium-albuterol (DUONEB) 0.5-2.5 (3) MG/3ML SOLN Take 3 mLs by nebulization every 6 (six) hours as needed (shortness of breath).      . loperamide (IMODIUM) 2 MG capsule Take 2 mg by mouth as needed for diarrhea or loose stools.      Marland Kitchen omeprazole (PRILOSEC) 20 MG capsule Take 20 mg by mouth daily.      . potassium chloride SA (K-DUR,KLOR-CON) 20 MEQ tablet Take 1 tablet (20 mEq total) by mouth daily.  30 tablet  0  . pravastatin (PRAVACHOL) 40 MG tablet Take 40  mg by mouth daily.      . prochlorperazine (COMPAZINE) 5 MG tablet Take 1 tablet (5 mg total) by mouth every 6 (six) hours as needed for nausea or vomiting.  30 tablet  1  . olmesartan (BENICAR) 40 MG tablet Take 40 mg by mouth daily.       Assessment: 80yo F with hx rectal cancer admitted with dehydration and FTT. Pharmacy asked to dose Vancomycin and Zosyn for possible sepsis.  Weight 37kg(confirmed with floor RN).  Renal: 0.67, 35CG, 79N  Goal of Therapy:  Vancomycin trough level 15-20 mcg/ml  Plan:   Vancomycin 750mg  IV q24h.  Zosyn 3.375g IV Q8H infused over 4hrs.  Measure Vanc trough at steady state.  Follow up renal fxn and culture results.  Romeo Rabon, PharmD, pager [phone]. 04/23/2013,6:56 PM.

## 2013-04-23 NOTE — Progress Notes (Addendum)
OFFICE PROGRESS NOTE  Interval history:  Ms. Sara Cochran returns for scheduled followup. She felt "a little better" following the IV fluids last week. She is accompanied by her stepdaughter who reports Ms. Sara Cochran has been "confused and delusional" for the past week. She states Ms. Sara Cochran is not eating or drinking. Ms. Sara Cochran reports nausea and vomiting after eating. Diarrhea is better though she continues to have loose stools. She has lower abdominal pain intermittently. She denies any bleeding. No fever. No shortness of breath or cough. She denies dysuria. She has become increasingly weak. She is unable to ambulate independently. Her grandson had to carry her to the car this morning. She denies any unusual headaches. No visual disturbance.   Objective: Filed Vitals:   04/23/13 1149  BP: 139/84  Pulse: 127  Temp: 94.5 F (34.7 C)   Frail-appearing elderly female. Pupils equal round and reactive to light. Sclera anicteric. Oropharynx is markedly dry appearing. Distant breath sounds. Heart is regular, tachycardic. Abdomen is soft and nontender. No organomegaly. No leg edema. Sacral erythema with one area of skin breakdown. No skin breakdown at the perineum. External hemorrhoids. Motor strength 5 over 5. She is alert and oriented x3. She did make several inappropriate comments during the course of the examination. Follows commands. Skin turgor is significantly decreased.   Lab Results: Lab Results  Component Value Date   WBC 4.0 04/15/2013   HGB 11.6 04/15/2013   HCT 34.6* 04/15/2013   MCV 86.9 04/15/2013   PLT 74* 04/15/2013   NEUTROABS 3.1 04/15/2013    Chemistry:    Chemistry      Component Value Date/Time   NA 127* 04/15/2013 1510   K 3.9 04/15/2013 1510   CO2 22 04/15/2013 1510   BUN 39.3* 04/15/2013 1510   BUN 14 02/03/2013 1257   CREATININE 1.0 04/15/2013 1510   CREATININE 0.62 02/03/2013 1257      Component Value Date/Time   CALCIUM 9.0 04/15/2013 1510   ALKPHOS 45 03/23/2013 1112   AST  12 03/23/2013 1112   ALT 7 03/23/2013 1112   BILITOT 0.22 03/23/2013 1112       Studies/Results: No results found.  Medications: I have reviewed the patient's current medications.  Assessment/Plan: 1. 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 Initiation of radiation and cycle 1 5-FU/mitomycin C. 02/23/2013, cycle 2 03/23/2013. Completed radiation 04/06/2013. 2. CT evidence of a perirectal abscess 01/22/2013. 3. COPD. 4. History of thrombocytopenia/neutropenia secondary to chemotherapy.  5. Hypokalemia 03/23/2013. She is on a potassium supplement. Potassium level was normal on 04/15/2013. 6. History of pain secondary to radiation skin breakdown at the perineum. The skin breakdown has improved. 7. Weight loss.  8. Diarrhea. Likely secondary to radiation. Stool negative for C. difficile 04/15/2013. The diarrhea is better. 9. Dehydration status post IV fluids 04/15/2013, 04/16/2013, 04/17/2013.  Dispositon-Sara Cochran has completed chemotherapy/radiation for treatment of SCCA of the distal rectum. She has failure to thrive of unknown etiology. She appears markedly dehydrated. Dr. Benay Spice has spoken with Dr. Doyle Askew, hospitalist. Sara Cochran will be direct admitted to the hospital.   Patient seen with Dr. Benay Spice. 30 minutes were spent face-to-face at today's visit with the majority of that time involved in counseling/coordination of care.  Ned Card ANP/GNP-BC   This was a shared visit with Ned Card. Sara Cochran is greater than 2 weeks out from completing radiation. She has anorexia, nausea, dehydration, and failure to thrive. The etiology of  her symptoms is unclear. We need to consider a systemic infection.  There was CT evidence of a perirectal abscess prior to beginning treatment for anal cancer, but she does not have symptoms to suggest a pelvic infection.  I contacted Dr. Doyle Askew and she graciously agreed to admit  Sara Cochran for further evaluation.  Julieanne Manson, M.D.

## 2013-04-23 NOTE — Progress Notes (Unsigned)
Patient to be admitted to hospital. NS stopped, PIV saline locked with brisk blood return noted. Patient's step-daughter to admitting and patient to be transported to hospital room by Raelyn Mora, [place]. Patient informed of the plan and she verbalizes understanding.

## 2013-04-24 DIAGNOSIS — C20 Malignant neoplasm of rectum: Secondary | ICD-10-CM

## 2013-04-24 DIAGNOSIS — R627 Adult failure to thrive: Secondary | ICD-10-CM

## 2013-04-24 DIAGNOSIS — D61818 Other pancytopenia: Secondary | ICD-10-CM

## 2013-04-24 DIAGNOSIS — J449 Chronic obstructive pulmonary disease, unspecified: Secondary | ICD-10-CM

## 2013-04-24 DIAGNOSIS — I10 Essential (primary) hypertension: Secondary | ICD-10-CM

## 2013-04-24 LAB — COMPREHENSIVE METABOLIC PANEL
ALT: 9 U/L (ref 0–35)
AST: 13 U/L (ref 0–37)
Albumin: 1.6 g/dL — ABNORMAL LOW (ref 3.5–5.2)
Alkaline Phosphatase: 40 U/L (ref 39–117)
BUN: 25 mg/dL — ABNORMAL HIGH (ref 6–23)
CALCIUM: 7.3 mg/dL — AB (ref 8.4–10.5)
CO2: 30 meq/L (ref 19–32)
Chloride: 92 mEq/L — ABNORMAL LOW (ref 96–112)
Creatinine, Ser: 0.57 mg/dL (ref 0.50–1.10)
GFR calc Af Amer: >90 mL/min (ref >90)
GFR calc non Af Amer: 87 mL/min — ABNORMAL LOW (ref >90)
Glucose, Bld: 103 mg/dL — ABNORMAL HIGH (ref 70–99)
POTASSIUM: 3.8 meq/L (ref 3.7–5.3)
SODIUM: 133 meq/L — AB (ref 137–147)
Total Bilirubin: 0.5 mg/dL (ref 0.3–1.2)
Total Protein: 4.9 g/dL — ABNORMAL LOW (ref 6.0–8.3)

## 2013-04-24 LAB — GLUCOSE, CAPILLARY: Glucose-Capillary: 101 mg/dL — ABNORMAL HIGH (ref 70–99)

## 2013-04-24 LAB — TSH: TSH: 1.528 u[IU]/mL (ref 0.350–4.500)

## 2013-04-24 LAB — CBC
HCT: 24.9 % — ABNORMAL LOW (ref 36.0–46.0)
Hemoglobin: 8.1 g/dL — ABNORMAL LOW (ref 12.0–15.0)
MCH: 29.2 pg (ref 26.0–34.0)
MCHC: 32.5 g/dL (ref 30.0–36.0)
MCV: 89.9 fL (ref 78.0–100.0)
Platelets: 50 10*3/uL — ABNORMAL LOW (ref 150–400)
RBC: 2.77 MIL/uL — ABNORMAL LOW (ref 3.87–5.11)
RDW: 15.5 % (ref 11.5–15.5)
WBC: 1.8 10*3/uL — ABNORMAL LOW (ref 4.0–10.5)

## 2013-04-24 LAB — URINALYSIS, ROUTINE W REFLEX MICROSCOPIC
Glucose, UA: NEGATIVE mg/dL
Hgb urine dipstick: NEGATIVE
Ketones, ur: NEGATIVE mg/dL
NITRITE: NEGATIVE
PROTEIN: 30 mg/dL — AB
Specific Gravity, Urine: 1.026 (ref 1.005–1.030)
UROBILINOGEN UA: 0.2 mg/dL (ref 0.0–1.0)
pH: 5.5 (ref 5.0–8.0)

## 2013-04-24 LAB — URINE MICROSCOPIC-ADD ON

## 2013-04-24 MED ORDER — RESOURCE INSTANT PROTEIN PO PWD PACKET
1.0000 | Freq: Two times a day (BID) | ORAL | Status: DC
  Filled 2013-04-24: qty 227

## 2013-04-24 MED ORDER — PRO-STAT SUGAR FREE PO LIQD
30.0000 mL | Freq: Two times a day (BID) | ORAL | Status: DC
  Administered 2013-04-24: 30 mL via ORAL
  Filled 2013-04-24 (×15): qty 30

## 2013-04-24 MED ORDER — BOOST / RESOURCE BREEZE PO LIQD
1.0000 | Freq: Three times a day (TID) | ORAL | Status: DC
  Administered 2013-04-24 (×2): 1 via ORAL

## 2013-04-24 NOTE — Progress Notes (Addendum)
[facility] PROGRESS NOTE  Sara Cochran GUY:[id number] DOB: 07-Jun-1933 DOA: 04/23/2013 PCP: Alonza Bogus, MD  Brief narrative: 80 year old female with past medical history of squamous cell carcinoma of anal region, undergoing chemotherapy (under Dr. Gearldine Shown care) and underwent RT who presented as a direct admission from cancer center due to severe malnutrition and dehydration.   Assessment and Plan:   Principal Problem:  Dehydration, failure to thrive  - likely due to malignancy, poor oral intake, diarrhea - continue supportive care with IV fluids  - will need PT eval once able to participate  - nutrition consulted  Active Problems:  Squamous cell cancer, anal region  - management per oncology  - underwent RT, completed 04/06/2013  - cycle 2 chemo 03/23/2013  Perirectal abscess  - seen on on CT abdomen 01/22/2013.. Will defer to oncology if there is a need to re-eval with CT study Possible sepsis  - low temp on admission, 94.5 F; now T 98 F - on broad spectrum antibiotics, vanco and zosyn - follow up blood culutres, urine culture - CXR did not reveal acute cardiopulmonary porcess  Neutropenia  - no fever but low temp, 94.5 F and WBC count 1.9  - on vanco and zosyn  Thrombocytopenia  - likely sequela of chemotherapy  - platelets 53 on this admission  - use SCD's for DVT prophylaxis  Code Status: Full  Family Communication: family at the bedside  Disposition Plan: home when stable   Leisa Lenz, MD  [facility] Pager [phone]  If 7PM-7AM, please contact night-coverage [email or web address] Password [email or web address] 04/24/2013, 5:05 PM   LOS: 1 day   Consultants:  Oncology   Procedures:  None   Antibiotics:  Vancomycin 04/23/2013 -->  Zosyn 2/5/201 5-->  HPI/Subjective: No acute overnight events.   Objective: Filed Vitals:   04/23/13 1600 04/23/13 2130 04/24/13 0613 04/24/13 1317  BP: 141/90 117/48 115/57 114/51  Pulse: 116 102 103 100  Temp: 97.8 F  (36.6 C) 98 F (36.7 C) 97.9 F (36.6 C) 98 F (36.7 C)  TempSrc: Oral Oral Oral Oral  Resp: 18 18 16 16   Height: 5\' 2"  (1.575 m)     Weight: 37.104 kg (81 lb 12.8 oz)  37.2 kg (82 lb 0.2 oz)   SpO2: 94% 93% 94% 92%    Intake/Output Summary (Last 24 hours) at 04/24/13 1705 Last data filed at 04/24/13 0914  Gross per 24 hour  Intake   1565 ml  Output    300 ml  Net   1265 ml    Exam:   General:  Pt is sleeping this am, no acute distress  Cardiovascular: Regular rate and rhythm, S1/S2 appreciated  Respiratory: Clear to auscultation bilaterally, no wheezing, no crackles, no rhonchi  Abdomen: Soft, non tender, non distended, bowel sounds present, no guarding  Extremities: No edema, pulses DP and PT palpable bilaterally  Neuro: Grossly nonfocal  Data Reviewed: Basic Metabolic Panel:  Recent Labs Lab 04/23/13 1650 04/24/13 0345  NA 132* 133*  K 3.8 3.8  CL 88* 92*  CO2 29 30  GLUCOSE 129* 103*  BUN 28* 25*  CREATININE 0.67 0.57  CALCIUM 7.8* 7.3*  MG 1.7  --   PHOS 4.3  --    Liver Function Tests:  Recent Labs Lab 04/23/13 1650 04/24/13 0345  AST 12 13  ALT 9 9  ALKPHOS 47 40  BILITOT 0.5 0.5  PROT 5.5* 4.9*  ALBUMIN 1.8* 1.6*   No results found for this  basename: LIPASE, AMYLASE,  in the last 168 hours No results found for this basename: AMMONIA,  in the last 168 hours CBC:  Recent Labs Lab 04/23/13 1650 04/24/13 0345  WBC 1.9* 1.8*  NEUTROABS 1.6*  --   HGB 9.1* 8.1*  HCT 27.6* 24.9*  MCV 89.3 89.9  PLT 53* 50*   Cardiac Enzymes: No results found for this basename: CKTOTAL, CKMB, CKMBINDEX, TROPONINI,  in the last 168 hours BNP: No components found with this basename: POCBNP,  CBG:  Recent Labs Lab 04/24/13 0543  GLUCAP 101*    Recent Results (from the past 240 hour(s))  TECHNOLOGIST REVIEW     Status: None   Collection Time    04/15/13  1:12 PM      Result Value Range Status   Technologist Review     Final   Value: 1%  myelocytes, toxic granulation and vacuolated neutrophils present  CLOSTRIDIUM DIFFICILE BY PCR     Status: None   Collection Time    04/15/13  4:38 PM      Result Value Range Status   C difficile by pcr Negative  Negative Final   Comment:  This assay detects the presence of Clostridium difficile DNA codingfor toxin B (tcdB) by real-time polymerase chain reaction (PCR)amplification.This test was developed and its performance characteristics have beendetermined by [employer].      Performance characteristics referto the analytical performance of the test. This test has not beencleared or approved by the [place] Food and Drug Administration. The FDAhas determined that such clearance or approval is not necessary. Thislaboratory is      certified under the [facility] [place] as qualified to perform high complexity clinicallaboratory testing.     Studies: Portable Chest 1 View  04/23/2013   CLINICAL DATA:  80 year old female cough and congestion. Initial encounter.  EXAM: PORTABLE CHEST - 1 VIEW  COMPARISON:  05/03/2008.  FINDINGS: Portable AP upright view at 1745 hrs. Chronic large lung volumes with attenuated bronchovascular markings in keeping with emphysema. Normal cardiac size and mediastinal contours. Visualized tracheal air column is within normal limits. No pneumothorax, pulmonary edema, pleural effusion or confluent pulmonary opacity.  IMPRESSION: Chronic hyperinflation. No acute cardiopulmonary abnormality identified.   Electronically Signed   By: Lars Pinks M.D.   On: 04/23/2013 18:18    Scheduled Meds: . estradiol  2 mg Oral Daily  . feeding supplement (PRO-STAT SUGAR FREE 64)  30 mL Oral BID WC  . feeding supplement (RESOURCE BREEZE)  1 Container Oral TID WC  . pantoprazole  40 mg Oral Daily  . piperacillin-tazobactam (ZOSYN)  IV  3.375 g Intravenous Q8H  . protein supplement  1 scoop Oral BID WC  . simvastatin  20 mg Oral q1800  . vancomycin  750 mg  Intravenous Q24H   Continuous Infusions: . sodium chloride 75 mL/hr (04/24/13 0539)

## 2013-04-24 NOTE — Progress Notes (Signed)
IP PROGRESS NOTE  Subjective:   She feels better. She was able to eat breakfast.  Objective: Vital signs in last 24 hours: Blood pressure 114/51, pulse 100, temperature 98 F (36.7 C), temperature source Oral, resp. rate 16, height 5\' 2"  (1.575 m), weight 82 lb 0.2 oz (37.2 kg), SpO2 92.00%.  Intake/Output from previous day: 02/05 0701 - 02/06 0700 In: 1445 [P.O.:340; I.V.:825; IV Piggyback:280] Out: 100 [Urine:100]  Physical Exam:  HEENT: No thrush, the mouth appears less dry Lungs: Distant breath sounds, no respiratory distress Cardiac: Regular rate and rhythm Abdomen: No hepatosplenomegaly, no mass Extremities: No leg edema     Lab Results:  Recent Labs  04/23/13 1650 04/24/13 0345  WBC 1.9* 1.8*  HGB 9.1* 8.1*  HCT 27.6* 24.9*  PLT 53* 50*    BMET  Recent Labs  04/23/13 1650 04/24/13 0345  NA 132* 133*  K 3.8 3.8  CL 88* 92*  CO2 29 30  GLUCOSE 129* 103*  BUN 28* 25*  CREATININE 0.67 0.57  CALCIUM 7.8* 7.3*    Studies/Results: Portable Chest 1 View  04/23/2013   CLINICAL DATA:  80 year old female cough and congestion. Initial encounter.  EXAM: PORTABLE CHEST - 1 VIEW  COMPARISON:  05/03/2008.  FINDINGS: Portable AP upright view at 1745 hrs. Chronic large lung volumes with attenuated bronchovascular markings in keeping with emphysema. Normal cardiac size and mediastinal contours. Visualized tracheal air column is within normal limits. No pneumothorax, pulmonary edema, pleural effusion or confluent pulmonary opacity.  IMPRESSION: Chronic hyperinflation. No acute cardiopulmonary abnormality identified.   Electronically Signed   By: Lars Pinks M.D.   On: 04/23/2013 18:18    Medications: I have reviewed the patient's current medications.  Assessment/Plan:  1. 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 2. Dehydration/failure to  thrive-? Sepsis syndrome 3. Pancytopenia-likely secondary to chemotherapy/radiation versus a systemic infection-stable 4. CT evidence of a perirectal abscess 01/22/2013 5. COPD  She was admitted yesterday with dehydration and failure to thrive. She appears better with intravenous hydration and antibiotics. I remain concerned she could have a systemic infection. The cultures are negative to date.  Recommendations:  1. CT of the abdomen/pelvis to followup on the perirectal fluid collection noted on the staging CT in November 2014 2. Follow blood cultures and continue antibiotics 3. Please call oncology as needed over the weekend, I will see her 04/27/2013  I appreciate the care from Dr. Charlies Silvers   LOS: 1 day   [facility], Dominica Severin  04/24/2013, 4:24 PM

## 2013-04-24 NOTE — Progress Notes (Signed)
INITIAL NUTRITION ASSESSMENT  DOCUMENTATION CODES Per approved criteria  -Severe malnutrition in the context of chronic illness -Underweight  Pt meets criteria for severe MALNUTRITION in the context of chronic illness as evidenced by PO intake <75% est nutrition needs for > one month, 29% weight loss over 1.5 month period, severe muscle wasting and subcutaneous fat loss.   INTERVENTION: -Recommend Resource Breeze po TID, each supplement provides 250 kcal and 9 grams of protein -Recommend Beneprotein and Pro-Stat BID -Consider addition of appetite stimulant d/t prolonged period of sub-optimal nutrition -Encouraged PO intake -Will continue to monitor  NUTRITION DIAGNOSIS: Inadequate oral intake related to food aversions/loose stools as evidenced by PO intake < 75% for > one month, unintentional wt loss.   Goal: Pt to meet >/= 90% of their estimated nutrition needs    Monitor:  Total protein/energy intake, labs, weights, GI profile  Reason for Assessment: Consult to Assess/MST  80 y.o. female  Admitting Dx: Dehydration  ASSESSMENT: 80 year old female with past medical history of squamous cell carcinoma of anal region, undergoing chemotherapy (under Dr. Gearldine Shown care) and underwent RT who presented as a direct admission from cancer center due to severe malnutrition and dehydration. Pt reported feeling extremely weak and per family having intermittent confusions. Pt also reported having loose stools, abdominal discomfort and nausea and vomiting.   -Pt with 6 weeks of decreased appetite, loose stools, and abd pain per pt's son -Daily intake would consist of applesauce that pt consumed with pills -Diet intake has significantly decreased as pt experienced loose stools/GI distress after consuming liquids and solid foods, resulting in pt restricting all food items -Has tried Ensure/Boost, but pt unable to tolerate -Usual body weight is 115 lbs. Has lost 33 lbs in past six weeks after  beginning chemo and radiation treatments -Son assisted in pt's breakfast this morning. Noted improvement in appetite, ate approximately 75% of oatmeal, and a few bites of eggs. Denied any current n/v -Pt willing to try [employer] as supplement alternative. Enjoyed sample taste -Will trial Beneprotein and Pro-stat to increase nutrient density of food items. Family in agreement to promote supplement -May benefit from appetite stimulant d/t prolonged period of sub-optimal intake, significant weight loss, and evident signs of wasting  Nutrition Focused Physical Exam:  Subcutaneous Fat:  Orbital Region: severe Upper Arm Region: severe Thoracic and Lumbar Region: severe  Muscle:  Temple Region: severe Clavicle Bone Region: severe Clavicle and Acromion Bone Region: severe Scapular Bone Region: severe Dorsal Hand: severe Patellar Region: severe Anterior Thigh Region: severe Posterior Calf Region: severe   Edema: N/A     Height: Ht Readings from Last 1 Encounters:  04/23/13 5\' 2"  (1.575 m)    Weight: Wt Readings from Last 1 Encounters:  04/24/13 82 lb 0.2 oz (37.2 kg)    Ideal Body Weight: 110 lbs  % Ideal Body Weight: 75%  Wt Readings from Last 10 Encounters:  04/24/13 82 lb 0.2 oz (37.2 kg)  04/23/13 81 lb 12.8 oz (37.104 kg)  04/16/13 84 lb (38.102 kg)  04/15/13 81 lb 11.2 oz (37.059 kg)  04/02/13 92 lb 11.2 oz (42.048 kg)  03/27/13 98 lb 14.4 oz (44.861 kg)  03/23/13 101 lb 1.6 oz (45.859 kg)  03/20/13 103 lb 9.6 oz (46.993 kg)  03/13/13 100 lb (45.36 kg)  03/09/13 98 lb 12.8 oz (44.815 kg)    Usual Body Weight: 115 lbs  % Usual Body Weight: 71%  BMI:  Body mass index is 15 kg/(m^2). Underweight  Estimated Nutritional Needs: Kcal: 1200-1400 Protein: 65-75 gram Fluid: >/=1400 ml/daily  Skin: Stage 2 sacral pressure ulcer  Diet Order: General  EDUCATION NEEDS: -No education needs identified at this time   Intake/Output Summary (Last 24 hours)  at 04/24/13 1017 Last data filed at 04/24/13 6213  Gross per 24 hour  Intake   1445 ml  Output    100 ml  Net   1345 ml    Last BM: 2/05   Labs:   Recent Labs Lab 04/23/13 1650 04/24/13 0345  NA 132* 133*  K 3.8 3.8  CL 88* 92*  CO2 29 30  BUN 28* 25*  CREATININE 0.67 0.57  CALCIUM 7.8* 7.3*  MG 1.7  --   PHOS 4.3  --   GLUCOSE 129* 103*    CBG (last 3)   Recent Labs  04/24/13 0543  GLUCAP 101*    Scheduled Meds: . estradiol  2 mg Oral Daily  . pantoprazole  40 mg Oral Daily  . piperacillin-tazobactam (ZOSYN)  IV  3.375 g Intravenous Q8H  . simvastatin  20 mg Oral q1800  . vancomycin  750 mg Intravenous Q24H    Continuous Infusions: . sodium chloride 75 mL/hr (04/24/13 0539)    Past Medical History  Diagnosis Date  . COPD (chronic obstructive pulmonary disease)   . High cholesterol   . Acid reflux   . Back pain   . Migraine   . Weight loss     1 year 25 lbs  . Allergy   . Perirectal abscess   . Hypertension   . Cancer 01/21/13 bx     rectal cancer=invasive squamous cell ca    Past Surgical History  Procedure Laterality Date  . Cesarean section    . Abdominal hysterectomy    . Cyst removal hand      3 removed from right hand  . Colonoscopy w/ polypectomy  01/23/2001    polypoid colonic mucosa,no adenomatous change or malignancy identified  . Rectal biopsy  01/21/2013    invasive squamous cell ca,mod to poorly differentiatiated    Atlee Abide MS RD LDN Clinical Dietitian [phone]

## 2013-04-25 LAB — BASIC METABOLIC PANEL
BUN: 17 mg/dL (ref 6–23)
CHLORIDE: 95 meq/L — AB (ref 96–112)
CO2: 28 mEq/L (ref 19–32)
Calcium: 7 mg/dL — ABNORMAL LOW (ref 8.4–10.5)
Creatinine, Ser: 0.54 mg/dL (ref 0.50–1.10)
GFR calc Af Amer: >90 mL/min (ref >90)
GFR calc non Af Amer: 89 mL/min — ABNORMAL LOW (ref >90)
Glucose, Bld: 97 mg/dL (ref 70–99)
Potassium: 3 mEq/L — ABNORMAL LOW (ref 3.7–5.3)
SODIUM: 134 meq/L — AB (ref 137–147)

## 2013-04-25 LAB — GLUCOSE, CAPILLARY: Glucose-Capillary: 82 mg/dL (ref 70–99)

## 2013-04-25 MED ORDER — POTASSIUM CHLORIDE CRYS ER 20 MEQ PO TBCR
40.0000 meq | EXTENDED_RELEASE_TABLET | Freq: Once | ORAL | Status: AC
  Administered 2013-04-25: 40 meq via ORAL
  Filled 2013-04-25: qty 2

## 2013-04-25 NOTE — Progress Notes (Addendum)
[facility] PROGRESS NOTE  Sara Cochran PXT:[id number] DOB: 08-13-1933 DOA: 04/23/2013 PCP: Alonza Bogus, MD  Brief narrative: 80 year old female with past medical history of squamous cell carcinoma of anal region, undergoing chemotherapy (under Dr. Gearldine Shown care) and underwent RT who presented as a direct admission from cancer center due to severe malnutrition and dehydration.   Assessment and Plan:   Principal Problem:  Dehydration, failure to thrive, diarrhea - likely due to malignancy, poor oral intake, diarrhea  - continue supportive care with IV fluids  - will follow up on PT recomendtions - nutrition consulted  - C.diff negative by PCR Active Problems:  Squamous cell cancer, anal region  - management per oncology  - underwent RT, completed 04/06/2013  - cycle 2 chemo 03/23/2013  Perirectal abscess  - seen on on CT abdomen 01/22/2013.. Will defer to oncology if there is a need to re-eval with CT study  Possible sepsis  - low temp on admission, 94.5 F; now T 98 F  - on broad spectrum antibiotics, vanco and zosyn  - blood cultures show no growth to date; urine culture - pending final report  - CXR did not reveal acute cardiopulmonary porcess  Anemia of chronic disease - secondary to history of malignancy - hemoglobin 8.1 - no indications for transfusion  Neutropenia  - no fever but low temp, 94.5 F and WBC count 1.9  - on vanco and zosyn  Thrombocytopenia  - likely sequela of chemotherapy  - platelets 53 on this admission and then 69  - using SCD's for DVT prophylaxis  Hypokalemia - repleted today Hyponatremia - likely due to dehydration - continue IV fluids  Severe protein calorie malnutrition - nutrition consulted   Code Status: Full  Family Communication: family at the bedside  Disposition Plan: home when stable   Consultants:  Oncology  Procedures:  None  Antibiotics:  Vancomycin 04/23/2013 -->  Zosyn 2/5/201 5-->   Leisa Lenz, MD  [facility] Pager [phone]  If 7PM-7AM, please contact night-coverage [email or web address] Password [email or web address] 04/25/2013, 12:30 PM   LOS: 2 days    HPI/Subjective: No acute overnight events.   Objective: Filed Vitals:   04/24/13 1317 04/24/13 2124 04/25/13 0632 04/25/13 0657  BP: 114/51 110/57 114/48   Pulse: 100 88 92   Temp: 98 F (36.7 C) 98.1 F (36.7 C) 97.7 F (36.5 C)   TempSrc: Oral Oral Oral   Resp: 16 18 16    Height:      Weight:    37.1 kg (81 lb 12.7 oz)  SpO2: 92% 99% 96%     Intake/Output Summary (Last 24 hours) at 04/25/13 1230 Last data filed at 04/25/13 0658  Gross per 24 hour  Intake    240 ml  Output    701 ml  Net   -461 ml    Exam:   General:  Pt is sleeping, no acute distress, malnourished  Cardiovascular: Regular rate and rhythm, S1/S2 appreciated   Respiratory: Clear to auscultation bilaterally, no wheezing  Abdomen: Soft, non tender, non distended, bowel sounds present  Extremities: No edema, pulses DP and PT palpable bilaterally  Neuro: Grossly nonfocal  Data Reviewed: Basic Metabolic Panel:  Recent Labs Lab 04/23/13 1650 04/24/13 0345 04/25/13 0407  NA 132* 133* 134*  K 3.8 3.8 3.0*  CL 88* 92* 95*  CO2 29 30 28   GLUCOSE 129* 103* 97  BUN 28* 25* 17  CREATININE 0.67 0.57 0.54  CALCIUM 7.8* 7.3* 7.0*  MG  1.7  --   --   PHOS 4.3  --   --    Liver Function Tests:  Recent Labs Lab 04/23/13 1650 04/24/13 0345  AST 12 13  ALT 9 9  ALKPHOS 47 40  BILITOT 0.5 0.5  PROT 5.5* 4.9*  ALBUMIN 1.8* 1.6*   No results found for this basename: LIPASE, AMYLASE,  in the last 168 hours No results found for this basename: AMMONIA,  in the last 168 hours CBC:  Recent Labs Lab 04/23/13 1650 04/24/13 0345  WBC 1.9* 1.8*  NEUTROABS 1.6*  --   HGB 9.1* 8.1*  HCT 27.6* 24.9*  MCV 89.3 89.9  PLT 53* 50*   Cardiac Enzymes: No results found for this basename: CKTOTAL, CKMB, CKMBINDEX, TROPONINI,  in the last 168 hours BNP: No  components found with this basename: POCBNP,  CBG:  Recent Labs Lab 04/24/13 0543 04/25/13 0629  GLUCAP 101* 82    Recent Results (from the past 240 hour(s))  TECHNOLOGIST REVIEW     Status: None   Collection Time    04/15/13  1:12 PM      Result Value Range Status   Technologist Review     Final   Value: 1% myelocytes, toxic granulation and vacuolated neutrophils present  CLOSTRIDIUM DIFFICILE BY PCR     Status: None   Collection Time    04/15/13  4:38 PM      Result Value Range Status   C difficile by pcr Negative  Negative Final   Comment:  This assay detects the presence of Clostridium difficile DNA codingfor toxin B (tcdB) by real-time polymerase chain reaction (PCR)amplification.This test was developed and its performance characteristics have beendetermined by [employer].      Performance characteristics referto the analytical performance of the test. This test has not beencleared or approved by the [place] Food and Drug Administration. The FDAhas determined that such clearance or approval is not necessary. Thislaboratory is      certified under the [facility] [place] as qualified to perform high complexity clinicallaboratory testing.  URINE CULTURE     Status: None   Collection Time    04/24/13 12:09 AM      Result Value Range Status   Specimen Description URINE, CLEAN CATCH   Final   Special Requests NONE   Final   Culture  Setup Time     Final   Value: 04/24/2013 03:55     Performed at [facility]     Final   Value: 70,000 COLONIES/ML     Performed at [facility]   Culture     Final   Value: Culture reincubated for better growth     Performed at [facility]   Report Status PENDING   Incomplete  CULTURE, BLOOD (ROUTINE X 2)     Status: None   Collection Time    04/24/13 11:35 AM      Result Value Range Status   Specimen Description BLOOD RIGHT WRIST   Final   Special Requests BOTTLES  DRAWN AEROBIC AND ANAEROBIC 5CC   Final   Culture  Setup Time     Final   Value: 04/24/2013 14:10     Performed at [facility]   Culture     Final   Value:        BLOOD CULTURE RECEIVED NO GROWTH TO DATE CULTURE WILL BE HELD FOR 5 DAYS BEFORE ISSUING A  FINAL NEGATIVE REPORT     Performed at [facility]   Report Status PENDING   Incomplete  CULTURE, BLOOD (ROUTINE X 2)     Status: None   Collection Time    04/24/13 11:40 AM      Result Value Range Status   Specimen Description BLOOD LEFT WRIST   Final   Special Requests BOTTLES DRAWN AEROBIC AND ANAEROBIC 3CC   Final   Culture  Setup Time     Final   Value: 04/24/2013 14:11     Performed at [facility]   Culture     Final   Value:        BLOOD CULTURE RECEIVED NO GROWTH TO DATE CULTURE WILL BE HELD FOR 5 DAYS BEFORE ISSUING A FINAL NEGATIVE REPORT     Performed at [facility]   Report Status PENDING   Incomplete     Studies: Portable Chest 1 View  04/23/2013   CLINICAL DATA:  80 year old female cough and congestion. Initial encounter.  EXAM: PORTABLE CHEST - 1 VIEW  COMPARISON:  05/03/2008.  FINDINGS: Portable AP upright view at 1745 hrs. Chronic large lung volumes with attenuated bronchovascular markings in keeping with emphysema. Normal cardiac size and mediastinal contours. Visualized tracheal air column is within normal limits. No pneumothorax, pulmonary edema, pleural effusion or confluent pulmonary opacity.  IMPRESSION: Chronic hyperinflation. No acute cardiopulmonary abnormality identified.   Electronically Signed   By: Lars Pinks M.D.   On: 04/23/2013 18:18    Scheduled Meds: . estradiol  2 mg Oral Daily  . feeding supplement (PRO-STAT SUGAR FREE 64)  30 mL Oral BID WC  . feeding supplement (RESOURCE BREEZE)  1 Container Oral TID WC  . pantoprazole  40 mg Oral Daily  . piperacillin-tazobactam (ZOSYN)  IV  3.375 g Intravenous Q8H  . protein supplement  1 scoop Oral BID WC  . simvastatin  20  mg Oral q1800  . vancomycin  750 mg Intravenous Q24H   Continuous Infusions: . sodium chloride 75 mL/hr (04/24/13 1944)

## 2013-04-25 NOTE — Evaluation (Signed)
Physical Therapy Evaluation Patient Details Name: Sara Cochran MRN: [id number] DOB: 1933-12-15 Today's Date: 04/25/2013 Time: 3419-6222 PT Time Calculation (min): 10 min  PT Assessment / Plan / Recommendation History of Present Illness  year old female with past medical history of squamous cell carcinoma of anal region, undergoing chemotherapy (under Dr. Gearldine Shown care) and underwent RT who presented as a direct admission from cancer center due to severe malnutrition and dehydration. Pt reported feeling extremely weak and per family having intermittent confusions. Pt also reported having loose stools, abdominal discomfort and nausea and vomiting.    Clinical Impression  Pt ambulated in room. Pt will benefit from PT to address problems listed/    PT Assessment  Patient needs continued PT services    Follow Up Recommendations  Home health PT    Does the patient have the potential to tolerate intense rehabilitation      Barriers to Discharge        Equipment Recommendations  None recommended by PT    Recommendations for Other Services     Frequency Min 3X/week    Precautions / Restrictions Precautions Precautions: Fall   Pertinent Vitals/Pain No pain      Mobility  Bed Mobility Overal bed mobility: Needs Assistance Bed Mobility: Supine to Sit Supine to sit: Supervision Transfers Overall transfer level: Needs assistance Equipment used: Rolling walker (2 wheeled) Transfers: Sit to/from Stand Ambulation/Gait Ambulation/Gait assistance: Min assist Ambulation Distance (Feet): 20 Feet Assistive device: Rolling walker (2 wheeled) Gait Pattern/deviations: Step-through pattern;Trunk flexed General Gait Details: pt was fatigued and wanted to not walk farther,    Exercises     PT Diagnosis: Generalized weakness  PT Problem List: Decreased strength;Decreased activity tolerance;Decreased mobility PT Treatment Interventions: DME instruction;Gait training;Functional mobility  training;Therapeutic activities;Patient/family education     PT Goals(Current goals can be found in the care plan section) Acute Rehab PT Goals Patient Stated Goal: i will walk PT Goal Formulation: With patient Time For Goal Achievement: 05/09/13 Potential to Achieve Goals: Good  Visit Information  Last PT Received On: 04/25/13 Assistance Needed: +1 History of Present Illness: year old female with past medical history of squamous cell carcinoma of anal region, undergoing chemotherapy (under Dr. Gearldine Shown care) and underwent RT who presented as a direct admission from cancer center due to severe malnutrition and dehydration. Pt reported feeling extremely weak and per family having intermittent confusions. Pt also reported having loose stools, abdominal discomfort and nausea and vomiting.         Prior Functioning  Home Living Family/patient expects to be discharged to:: Private residence Living Arrangements: Children Available Help at Discharge: Family Type of Home: [place]: One [place]: Environmental consultant - 2 wheels Prior Function Level of Independence: Independent with assistive device(s) Communication Communication: No difficulties    Cognition  Cognition Arousal/Alertness: Awake/alert Behavior During Therapy: WFL for tasks assessed/performed Overall Cognitive Status: Within Functional Limits for tasks assessed    Extremity/Trunk Assessment Upper Extremity Assessment Upper Extremity Assessment: Generalized weakness Lower Extremity Assessment Lower Extremity Assessment: Generalized weakness Cervical / Trunk Assessment Cervical / Trunk Assessment: Kyphotic   Balance    End of Session PT - End of Session Equipment Utilized During Treatment: Gait belt Activity Tolerance: Patient tolerated treatment well Patient left: in chair;with call bell/phone within reach Nurse Communication: Mobility status  GP     Claretha Cooper 04/25/2013, 5:01 PM Tresa Endo  PT [phone]

## 2013-04-26 DIAGNOSIS — F411 Generalized anxiety disorder: Secondary | ICD-10-CM

## 2013-04-26 LAB — BASIC METABOLIC PANEL
BUN: 11 mg/dL (ref 6–23)
CHLORIDE: 97 meq/L (ref 96–112)
CO2: 26 mEq/L (ref 19–32)
Calcium: 7 mg/dL — ABNORMAL LOW (ref 8.4–10.5)
Creatinine, Ser: 0.5 mg/dL (ref 0.50–1.10)
GFR calc non Af Amer: >90 mL/min (ref >90)
Glucose, Bld: 98 mg/dL (ref 70–99)
Potassium: 3.4 mEq/L — ABNORMAL LOW (ref 3.7–5.3)
Sodium: 133 mEq/L — ABNORMAL LOW (ref 137–147)

## 2013-04-26 LAB — GLUCOSE, CAPILLARY: Glucose-Capillary: 97 mg/dL (ref 70–99)

## 2013-04-26 LAB — CBC
HEMATOCRIT: 25.4 % — AB (ref 36.0–46.0)
HEMOGLOBIN: 8.1 g/dL — AB (ref 12.0–15.0)
MCH: 29.3 pg (ref 26.0–34.0)
MCHC: 31.9 g/dL (ref 30.0–36.0)
MCV: 92 fL (ref 78.0–100.0)
Platelets: 46 10*3/uL — ABNORMAL LOW (ref 150–400)
RBC: 2.76 MIL/uL — AB (ref 3.87–5.11)
RDW: 15.7 % — ABNORMAL HIGH (ref 11.5–15.5)
WBC: 1.5 10*3/uL — AB (ref 4.0–10.5)

## 2013-04-26 LAB — VANCOMYCIN, TROUGH: VANCOMYCIN TR: 6.5 ug/mL — AB (ref 10.0–20.0)

## 2013-04-26 MED ORDER — POTASSIUM CHLORIDE CRYS ER 20 MEQ PO TBCR
40.0000 meq | EXTENDED_RELEASE_TABLET | Freq: Once | ORAL | Status: AC
  Administered 2013-04-26: 40 meq via ORAL
  Filled 2013-04-26: qty 2

## 2013-04-26 NOTE — Progress Notes (Signed)
[facility] PROGRESS NOTE  Sara Cochran UDJ:[id number] DOB: 01/24/1934 DOA: 04/23/2013 PCP: Alonza Bogus, MD  Brief narrative: 80 year old female with past medical history of squamous cell carcinoma of anal region, undergoing chemotherapy (under Dr. Gearldine Shown care) and underwent RT who presented as a direct admission from cancer center due to severe malnutrition and dehydration.   Assessment and Plan:   Principal Problem:  Dehydration, failure to thrive, diarrhea  - likely due to malignancy, poor oral intake, diarrhea  - we will continue supportive care with IV fluids  - feels better this am - nutrition consulted; encourage PO intake; she had pretty good breakfast and ate majority of her meal - C.diff negative by PCR  Active Problems:  Squamous cell cancer, anal region  - management per oncology  - underwent RT, completed 04/06/2013  - cycle 2 chemo 03/23/2013  Perirectal abscess  - seen on on CT abdomen 01/22/2013 - will defer CT repeat as she is doing better, afebrile Possible sepsis  - low temp on admission, 94.5 F; now T 98 F  - on broad spectrum antibiotics, vanco and zosyn  - blood cultures show no growth to date; urine culture - follow up final report  - CXR did not reveal acute cardiopulmonary porcess  Anemia of chronic disease  - secondary to history of malignancy  - hemoglobin 8.1  - no indications for transfusion  Neutropenia  - no fever but low temp, 94.5 F and WBC count 1.5 - on vanco and zosyn  Thrombocytopenia  - likely sequela of chemotherapy  - platelets 53 on this admission and then 50 --> 46 - using SCD's for DVT prophylaxis  Hypokalemia  - repleted today  Hyponatremia  - likely due to dehydration  - may continue IV fluids  Severe protein calorie malnutrition  - nutrition consulted  - encourage PO intake   Code Status: Full  Family Communication: family at the bedside  Disposition Plan: home when stable   Consultants:  Oncology   Procedures:  None  Antibiotics:  Vancomycin 04/23/2013 -->  Zosyn 2/5/201 5-->   Leisa Lenz, MD  [facility] Pager [phone]  If 7PM-7AM, please contact night-coverage [email or web address] Password [email or web address] 04/26/2013, 4:22 PM   LOS: 3 days    HPI/Subjective: No acute overnight events.  Objective: Filed Vitals:   04/25/13 2052 04/26/13 0500 04/26/13 0525 04/26/13 1500  BP: 105/53 110/52  106/49  Pulse: 97 101  99  Temp: 98.3 F (36.8 C) 98.4 F (36.9 C)  98.1 F (36.7 C)  TempSrc: Oral Oral  Oral  Resp: 16 18  18   Height:      Weight:   37.331 kg (82 lb 4.8 oz)   SpO2: 92% 92%  93%    Intake/Output Summary (Last 24 hours) at 04/26/13 1622 Last data filed at 04/26/13 1500  Gross per 24 hour  Intake    600 ml  Output    725 ml  Net   -125 ml    Exam:   General:  Pt is alert, follows commands appropriately, not in acute distress  Cardiovascular: Regular rate and rhythm, S1/S2, no murmurs, no rubs, no gallops  Respiratory: Clear to auscultation bilaterally, no wheezing, no crackles, no rhonchi  Abdomen: Soft, non tender, non distended, bowel sounds present, no guarding  Extremities: No edema, pulses DP and PT palpable bilaterally  Neuro: Grossly nonfocal  Data Reviewed: Basic Metabolic Panel:  Recent Labs Lab 04/23/13 1650 04/24/13 0345 04/25/13 0407 04/26/13 0455  NA  132* 133* 134* 133*  K 3.8 3.8 3.0* 3.4*  CL 88* 92* 95* 97  CO2 29 30 28 26   GLUCOSE 129* 103* 97 98  BUN 28* 25* 17 11  CREATININE 0.67 0.57 0.54 0.50  CALCIUM 7.8* 7.3* 7.0* 7.0*  MG 1.7  --   --   --   PHOS 4.3  --   --   --    Liver Function Tests:  Recent Labs Lab 04/23/13 1650 04/24/13 0345  AST 12 13  ALT 9 9  ALKPHOS 47 40  BILITOT 0.5 0.5  PROT 5.5* 4.9*  ALBUMIN 1.8* 1.6*   No results found for this basename: LIPASE, AMYLASE,  in the last 168 hours No results found for this basename: AMMONIA,  in the last 168 hours CBC:  Recent Labs Lab 04/23/13 1650  04/24/13 0345 04/26/13 0455  WBC 1.9* 1.8* 1.5*  NEUTROABS 1.6*  --   --   HGB 9.1* 8.1* 8.1*  HCT 27.6* 24.9* 25.4*  MCV 89.3 89.9 92.0  PLT 53* 50* 46*   Cardiac Enzymes: No results found for this basename: CKTOTAL, CKMB, CKMBINDEX, TROPONINI,  in the last 168 hours BNP: No components found with this basename: POCBNP,  CBG:  Recent Labs Lab 04/24/13 0543 04/25/13 0629 04/26/13 0733  GLUCAP 101* 82 97    Recent Results (from the past 240 hour(s))  URINE CULTURE     Status: None   Collection Time    04/24/13 12:09 AM      Result Value Range Status   Specimen Description URINE, CLEAN CATCH   Final   Special Requests NONE   Final   Culture  Setup Time     Final   Value: 04/24/2013 03:55     Performed at [facility]     Final   Value: 70,000 COLONIES/ML     Performed at [facility]   Culture     Final   Value: [place]     Performed at [facility]   Report Status PENDING   Incomplete  CULTURE, BLOOD (ROUTINE X 2)     Status: None   Collection Time    04/24/13 11:35 AM      Result Value Range Status   Specimen Description BLOOD RIGHT WRIST   Final   Special Requests BOTTLES DRAWN AEROBIC AND ANAEROBIC 5CC   Final   Culture  Setup Time     Final   Value: 04/24/2013 14:10     Performed at [facility]   Culture     Final   Value:        BLOOD CULTURE RECEIVED NO GROWTH TO DATE CULTURE WILL BE HELD FOR 5 DAYS BEFORE ISSUING A FINAL NEGATIVE REPORT     Performed at [facility]   Report Status PENDING   Incomplete  CULTURE, BLOOD (ROUTINE X 2)     Status: None   Collection Time    04/24/13 11:40 AM      Result Value Range Status   Specimen Description BLOOD LEFT WRIST   Final   Special Requests BOTTLES DRAWN AEROBIC AND ANAEROBIC 3CC   Final   Culture  Setup Time     Final   Value: 04/24/2013 14:11     Performed at [facility]   Culture     Final   Value:        BLOOD CULTURE RECEIVED  NO GROWTH TO  DATE CULTURE WILL BE HELD FOR 5 DAYS BEFORE ISSUING A FINAL NEGATIVE REPORT     Performed at [facility]   Report Status PENDING   Incomplete     Studies: No results found.  Scheduled Meds: . estradiol  2 mg Oral Daily  . feeding supplement (PRO-STAT SUGAR FREE 64)  30 mL Oral BID WC  . feeding supplement (RESOURCE BREEZE)  1 Container Oral TID WC  . pantoprazole  40 mg Oral Daily  . piperacillin-tazobactam (ZOSYN)  IV  3.375 g Intravenous Q8H  . potassium chloride  40 mEq Oral Once  . protein supplement  1 scoop Oral BID WC  . simvastatin  20 mg Oral q1800  . vancomycin  750 mg Intravenous Q24H   Continuous Infusions: . sodium chloride 75 mL/hr (04/24/13 1944)

## 2013-04-26 NOTE — Progress Notes (Signed)
ANTIBIOTIC CONSULT NOTE - Follow Up  Pharmacy Consult for Vancomycin and Zosyn Indication: rule out sepsis  Allergies  Allergen Reactions  . Aspirin Shortness Of Breath and Swelling  . Erythromycin Hives and Rash  . Niacin And Related Other (See Comments)    Redness all over     Patient Measurements: Height: 5\' 2"  (157.5 cm) Weight: 82 lb 4.8 oz (37.331 kg) IBW/kg (Calculated) : 50.1   Vital Signs: Temp: 98.4 F (36.9 C) (02/08 0500) Temp src: Oral (02/08 0500) BP: 110/52 mmHg (02/08 0500) Pulse Rate: 101 (02/08 0500) Intake/Output from previous day: 02/07 0701 - 02/08 0700 In: 650 [P.O.:600; IV Piggyback:50] Out: 825 [Urine:825] Intake/Output from this shift:    Labs:  Recent Labs  04/23/13 1650 04/24/13 0345 04/25/13 0407 04/26/13 0455  WBC 1.9* 1.8*  --  1.5*  HGB 9.1* 8.1*  --  8.1*  PLT 53* 50*  --  46*  CREATININE 0.67 0.57 0.54 0.50   Estimated Creatinine Clearance: 34.7 ml/min (by C-G formula based on Cr of 0.5). No results found for this basename: VANCOTROUGH, Corlis Leak, VANCORANDOM, GENTTROUGH, GENTPEAK, GENTRANDOM, TOBRATROUGH, TOBRAPEAK, TOBRARND, AMIKACINPEAK, AMIKACINTROU, AMIKACIN,  in the last 72 hours   Microbiology: Recent Results (from the past 720 hour(s))  TECHNOLOGIST REVIEW     Status: None   Collection Time    04/06/13  2:18 PM      Result Value Range Status   Technologist Review Rare Myelocyte. Toxic Gran with Vacuoles   Final  TECHNOLOGIST REVIEW     Status: None   Collection Time    04/15/13  1:12 PM      Result Value Range Status   Technologist Review     Final   Value: 1% myelocytes, toxic granulation and vacuolated neutrophils present  CLOSTRIDIUM DIFFICILE BY PCR     Status: None   Collection Time    04/15/13  4:38 PM      Result Value Range Status   C difficile by pcr Negative  Negative Final   Comment:  This assay detects the presence of Clostridium difficile DNA codingfor toxin B (tcdB) by real-time polymerase chain  reaction (PCR)amplification.This test was developed and its performance characteristics have beendetermined by [facility].      Performance characteristics referto the analytical performance of the test. This test has not beencleared or approved by the [place] Food and Drug Administration. The FDAhas determined that such clearance or approval is not necessary. Thislaboratory is      certified under the [facility] as qualified to perform high complexity clinicallaboratory testing.  URINE CULTURE     Status: None   Collection Time    04/24/13 12:09 AM      Result Value Range Status   Specimen Description URINE, CLEAN CATCH   Final   Special Requests NONE   Final   Culture  Setup Time     Final   Value: 04/24/2013 03:55     Performed at [facility]     Final   Value: 70,000 COLONIES/ML     Performed at [facility]   Culture     Final   Value: Culture reincubated for better growth     Performed at [facility]   Report Status PENDING   Incomplete  CULTURE, BLOOD (ROUTINE X 2)     Status: None   Collection Time    04/24/13 11:35 AM      Result  Value Range Status   Specimen Description BLOOD RIGHT WRIST   Final   Special Requests BOTTLES DRAWN AEROBIC AND ANAEROBIC 5CC   Final   Culture  Setup Time     Final   Value: 04/24/2013 14:10     Performed at [facility]   Culture     Final   Value:        BLOOD CULTURE RECEIVED NO GROWTH TO DATE CULTURE WILL BE HELD FOR 5 DAYS BEFORE ISSUING A FINAL NEGATIVE REPORT     Performed at [facility]   Report Status PENDING   Incomplete  CULTURE, BLOOD (ROUTINE X 2)     Status: None   Collection Time    04/24/13 11:40 AM      Result Value Range Status   Specimen Description BLOOD LEFT WRIST   Final   Special Requests BOTTLES DRAWN AEROBIC AND ANAEROBIC 3CC   Final   Culture  Setup Time     Final   Value: 04/24/2013 14:11     Performed at [facility]   Culture     Final   Value:        BLOOD CULTURE RECEIVED NO GROWTH TO DATE CULTURE WILL BE HELD FOR 5 DAYS BEFORE ISSUING A FINAL NEGATIVE REPORT     Performed at [facility]   Report Status PENDING   Incomplete    Medical History: Past Medical History  Diagnosis Date  . COPD (chronic obstructive pulmonary disease)   . High cholesterol   . Acid reflux   . Back pain   . Migraine   . Weight loss     1 year 25 lbs  . Allergy   . Perirectal abscess   . Hypertension   . Cancer 01/21/13 bx     rectal cancer=invasive squamous cell ca    Medications:  Prescriptions prior to admission  Medication Sig Dispense Refill  . albuterol (PROVENTIL HFA;VENTOLIN HFA) 108 (90 BASE) MCG/ACT inhaler Inhale 2 puffs into the lungs every 6 (six) hours as needed for wheezing or shortness of breath.      . ALPRAZolam (XANAX) 1 MG tablet Take 1 mg by mouth at bedtime as needed for sleep.      Marland Kitchen amLODipine (NORVASC) 5 MG tablet Take 5 mg by mouth daily.       . diphenoxylate-atropine (LOMOTIL) 2.5-0.025 MG per tablet Take 2 tablets by mouth 4 (four) times daily as needed for diarrhea or loose stools.  40 tablet  0  . estradiol (ESTRACE) 2 MG tablet Take 2 mg by mouth daily.       Marland Kitchen HYDROcodone-acetaminophen (LORCET 10/650) 10-650 MG per tablet Take 1 tablet by mouth every 6 (six) hours as needed for pain.      Marland Kitchen ipratropium-albuterol (DUONEB) 0.5-2.5 (3) MG/3ML SOLN Take 3 mLs by nebulization every 6 (six) hours as needed (shortness of breath).      . loperamide (IMODIUM) 2 MG capsule Take 2 mg by mouth as needed for diarrhea or loose stools.      Marland Kitchen omeprazole (PRILOSEC) 20 MG capsule Take 20 mg by mouth daily.      . potassium chloride SA (K-DUR,KLOR-CON) 20 MEQ tablet Take 1 tablet (20 mEq total) by mouth daily.  30 tablet  0  . pravastatin (PRAVACHOL) 40 MG tablet Take 40 mg by mouth daily.      . prochlorperazine (COMPAZINE) 5 MG tablet Take 1 tablet (5 mg total) by mouth every  6  (six) hours as needed for nausea or vomiting.  30 tablet  1  . olmesartan (BENICAR) 40 MG tablet Take 40 mg by mouth daily.       Assessment: 80yo F with hx rectal cancer admitted with dehydration and FTT. Pharmacy asked to dose Vancomycin and Zosyn for possible sepsis on 2/5.   Weight 37kg(confirmed with floor RN).  Renal function stable  Neutropenic - WBC 1.5  Afebrile  Cultures NGTD  Day #4 Vanc 750mg  q24 and Zosyn 3.375g q8  Goal of Therapy:  Vancomycin trough level 15-20 mcg/ml  Plan:  1) Continue vancomycin 750mg  IV q24 for now 2) Continue current Zosyn dosing 3) Will go ahead and check a vanc trough tonight prior to 8pm dose (4th dose) since patient only weighs 37kg and check how fast she is clearing drug   Adrian Saran, PharmD, BCPS Pager [phone] 04/26/2013 12:31 PM

## 2013-04-27 ENCOUNTER — Inpatient Hospital Stay (HOSPITAL_COMMUNITY): Payer: Medicare Other

## 2013-04-27 ENCOUNTER — Encounter (HOSPITAL_COMMUNITY): Payer: Self-pay | Admitting: Radiology

## 2013-04-27 DIAGNOSIS — M79609 Pain in unspecified limb: Secondary | ICD-10-CM

## 2013-04-27 DIAGNOSIS — M7989 Other specified soft tissue disorders: Secondary | ICD-10-CM

## 2013-04-27 DIAGNOSIS — B3749 Other urogenital candidiasis: Secondary | ICD-10-CM

## 2013-04-27 DIAGNOSIS — I82409 Acute embolism and thrombosis of unspecified deep veins of unspecified lower extremity: Secondary | ICD-10-CM | POA: Diagnosis present

## 2013-04-27 LAB — URINE CULTURE: Colony Count: 70000

## 2013-04-27 LAB — BASIC METABOLIC PANEL
BUN: 7 mg/dL (ref 6–23)
CHLORIDE: 98 meq/L (ref 96–112)
CO2: 25 mEq/L (ref 19–32)
Calcium: 6.7 mg/dL — ABNORMAL LOW (ref 8.4–10.5)
Creatinine, Ser: 0.44 mg/dL — ABNORMAL LOW (ref 0.50–1.10)
GFR calc non Af Amer: >90 mL/min (ref >90)
Glucose, Bld: 86 mg/dL (ref 70–99)
POTASSIUM: 3.7 meq/L (ref 3.7–5.3)
SODIUM: 134 meq/L — AB (ref 137–147)

## 2013-04-27 LAB — CBC
HEMATOCRIT: 23.4 % — AB (ref 36.0–46.0)
Hemoglobin: 7.6 g/dL — ABNORMAL LOW (ref 12.0–15.0)
MCH: 29.9 pg (ref 26.0–34.0)
MCHC: 32.5 g/dL (ref 30.0–36.0)
MCV: 92.1 fL (ref 78.0–100.0)
PLATELETS: 56 10*3/uL — AB (ref 150–400)
RBC: 2.54 MIL/uL — AB (ref 3.87–5.11)
RDW: 15.7 % — ABNORMAL HIGH (ref 11.5–15.5)
WBC: 1.5 10*3/uL — AB (ref 4.0–10.5)

## 2013-04-27 LAB — GLUCOSE, CAPILLARY: Glucose-Capillary: 97 mg/dL (ref 70–99)

## 2013-04-27 MED ORDER — IOHEXOL 300 MG/ML  SOLN
25.0000 mL | INTRAMUSCULAR | Status: AC
  Administered 2013-04-27 (×2): 25 mL via ORAL

## 2013-04-27 MED ORDER — VANCOMYCIN HCL IN DEXTROSE 750-5 MG/150ML-% IV SOLN
750.0000 mg | Freq: Two times a day (BID) | INTRAVENOUS | Status: DC
  Administered 2013-04-27: 750 mg via INTRAVENOUS
  Filled 2013-04-27 (×2): qty 150

## 2013-04-27 MED ORDER — ENOXAPARIN SODIUM 40 MG/0.4ML ~~LOC~~ SOLN
1.0000 mg/kg | Freq: Two times a day (BID) | SUBCUTANEOUS | Status: DC
  Administered 2013-04-27 – 2013-05-01 (×8): 40 mg via SUBCUTANEOUS
  Filled 2013-04-27 (×11): qty 0.4

## 2013-04-27 MED ORDER — IOHEXOL 300 MG/ML  SOLN
80.0000 mL | Freq: Once | INTRAMUSCULAR | Status: AC | PRN
  Administered 2013-04-27: 80 mL via INTRAVENOUS

## 2013-04-27 MED ORDER — ENOXAPARIN SODIUM 40 MG/0.4ML ~~LOC~~ SOLN
1.0000 mg/kg | SUBCUTANEOUS | Status: AC
  Filled 2013-04-27: qty 0.4

## 2013-04-27 NOTE — Progress Notes (Signed)
ANTICOAGULATION CONSULT NOTE - Initial Consult  Pharmacy Consult for Lovenox Indication: DVT treatment  Allergies  Allergen Reactions  . Aspirin Shortness Of Breath and Swelling  . Erythromycin Hives and Rash  . Niacin And Related Other (See Comments)    Redness all over     Patient Measurements: Height: 5\' 2"  (157.5 cm) Weight: 82 lb 12.8 oz (37.558 kg) IBW/kg (Calculated) : 50.1  Vital Signs: Temp: 98.2 F (36.8 C) (02/09 1400) Temp src: Oral (02/09 1400) BP: 108/60 mmHg (02/09 1400) Pulse Rate: 99 (02/09 1400)  Labs:  Recent Labs  04/25/13 0407 04/26/13 0455 04/27/13 0330  HGB  --  8.1* 7.6*  HCT  --  25.4* 23.4*  PLT  --  46* 56*  CREATININE 0.54 0.50 0.44*    Estimated Creatinine Clearance: 35 ml/min (by C-G formula based on Cr of 0.44).   Medical History: Past Medical History  Diagnosis Date  . COPD (chronic obstructive pulmonary disease)   . High cholesterol   . Acid reflux   . Back pain   . Migraine   . Weight loss     1 year 25 lbs  . Allergy   . Perirectal abscess   . Hypertension   . Cancer 01/21/13 bx     rectal cancer=invasive squamous cell ca   Assessment: 28 yoF undergoing chemotherapy/radiation for squamous cell carcinoma of anal region admitted 2/5 with severe malnutrition and FTT, also being treated for UTI and perirectal abscess.   Pt with anemia and thrombocytopenia.  Today found to have RUE deep and superficial vein thrombosis involving R subclavian, brachial and basilic veins.  Pharmacy consulted to begin full dose lovenox for VTE treatment.    CBC: Hgb slowly decreasing since admission, today 7.6, plts 56 stable [place] INR 1.66, PT 19.1 elevated Renal: SCr 0.44, CrCl ~35 ml/min Wt 37.6kg   Goal of Therapy:  Anti-Xa level 0.6-1 units/ml 4hrs after LMWH dose given Monitor platelets by anticoagulation protocol: Yes   Plan:  Lovenox 1mg /kg q 12 hours, f/u renal function and CBC closely  Ralene Bathe, PharmD, BCPS 04/27/2013,  5:08 PM  Pager: [phone]

## 2013-04-27 NOTE — Progress Notes (Signed)
Physical Therapy Treatment Patient Details Name: Sara Cochran MRN: [id number] DOB: [birth year] Today's Date: 04/27/2013 Time: 8563-1497 PT Time Calculation (min): 9 min  PT Assessment / Plan / Recommendation  History of Present Illness 80 year old female with past medical history of squamous cell carcinoma of anal region, undergoing chemotherapy (under Dr. Gearldine Shown care) and underwent RT who presented as a direct admission from cancer center due to severe malnutrition and dehydration. Pt reported feeling extremely weak and per family having intermittent confusions. Pt also reported having loose stools, abdominal discomfort and nausea and vomiting.     PT Comments   Pt agreeable to ambulate short distance today.  Follow Up Recommendations  Home health PT     Does the patient have the potential to tolerate intense rehabilitation     Barriers to Discharge        Equipment Recommendations  None recommended by PT    Recommendations for Other Services    Frequency Min 3X/week   Progress towards PT Goals Progress towards PT goals: Progressing toward goals  Plan Current plan remains appropriate    Precautions / Restrictions Precautions Precautions: Fall   Pertinent Vitals/Pain No specific complaints    Mobility  Bed Mobility Overal bed mobility: Needs Assistance Bed Mobility: Supine to Sit;Sit to Supine Supine to sit: Supervision;HOB elevated Sit to supine: Supervision Transfers Overall transfer level: Needs assistance Equipment used: Rolling walker (2 wheeled) Transfers: Sit to/from Stand Sit to Stand: Min guard Ambulation/Gait Ambulation/Gait assistance: Min guard Ambulation Distance (Feet): 80 Feet Assistive device: Rolling walker (2 wheeled) Gait Pattern/deviations: Step-through pattern;Trunk flexed Gait velocity: decr General Gait Details: ambulated as tolerated - has been drinking CT contrast for imaging later today    Exercises     PT Diagnosis:    PT Problem  List:   PT Treatment Interventions:     PT Goals (current goals can now be found in the care plan section)    Visit Information  Last PT Received On: 04/27/13 Assistance Needed: +1 History of Present Illness: 80 year old female with past medical history of squamous cell carcinoma of anal region, undergoing chemotherapy (under Dr. Gearldine Shown care) and underwent RT who presented as a direct admission from cancer center due to severe malnutrition and dehydration. Pt reported feeling extremely weak and per family having intermittent confusions. Pt also reported having loose stools, abdominal discomfort and nausea and vomiting.      Subjective Data      Cognition  Cognition Arousal/Alertness: Awake/alert Behavior During Therapy: WFL for tasks assessed/performed Overall Cognitive Status: Within Functional Limits for tasks assessed    Balance     End of Session PT - End of Session Activity Tolerance: Patient tolerated treatment well Patient left: in bed;with call bell/phone within reach Nurse Communication: Mobility status (observed pt ambulating)   GP     Sara Cochran,Sara Cochran 04/27/2013, 3:31 PM Carmelia Bake, PT, DPT 04/27/2013 Pager: [phone]

## 2013-04-27 NOTE — Progress Notes (Addendum)
[facility] PROGRESS NOTE  Sara Cochran CZY:[id number] DOB: 1933/08/20 DOA: 04/23/2013 PCP: Alonza Bogus, MD  Brief narrative: 80 year old female with past medical history of squamous cell carcinoma of anal region, undergoing chemotherapy (under Dr. Gearldine Shown care) and underwent RT who presented as a direct admission from cancer center due to severe malnutrition and dehydration. Hospital course complicated with UTI Klebsiella and citrobacter. Additionally, she was found to have right arm DVT and was started on Lovenox.  Assessment and Plan:   Principal Problem:  Dehydration, failure to thrive, diarrhea  - likely due to malignancy, poor oral intake, diarrhea  - we will continue supportive care with IV fluids for another 24 hours - nutrition consulted; encouraged PO intake - C.diff negative by PCR  Active Problems:  Squamous cell cancer, anal region  - management per oncology  - underwent RT, completed 04/06/2013  - cycle 2 chemo 03/23/2013  Right arm swelling - doppler study positive for DVT - Lovenox per pharmacy started Perirectal abscess  - seen on on CT abdomen 01/22/2013  - will defer CT abd eval as she is doing better, afebrile  Possible sepsis  - low temp on admission, 94.5 F; now T 98 F - likely due to UTI as urine culture growing Klebsiella and Citrobacter - on broad spectrum antibiotics, vanco and zosyn; will continue zosyn and stop vanco - blood cultures show no growth to date - CXR did not reveal acute cardiopulmonary porcess  Anemia of chronic disease  - secondary to history of malignancy  - hemoglobin 8.1 --> 7.6 - will see with oncology if they agree with blood transfusion Neutropenia  - no fever but low temp, 94.5 F and WBC count 1.5  - on vanco and zosyn  Thrombocytopenia  - likely sequela of chemotherapy  - platelets 53 on this admission and then 50 --> 46  - using SCD's for DVT prophylaxis  Hypokalemia  - repleted today  Hyponatremia  - likely  due to dehydration  - may continue IV fluids  Severe protein calorie malnutrition  - nutrition consulted  - encourage PO intake   Code Status: Full  Family Communication: family at the bedside  Disposition Plan: home when stable   Consultants:  Oncology  Procedures:  None  Antibiotics:  Vancomycin 04/23/2013 -->  Zosyn 2/5/201 5-->  Leisa Lenz, MD  [facility] Pager [phone]  If 7PM-7AM, please contact night-coverage [email or web address] Password [email or web address] 04/27/2013, 2:12 PM   LOS: 4 days   HPI/Subjective: Feels better.   Objective: Filed Vitals:   04/26/13 1500 04/26/13 2030 04/27/13 0512 04/27/13 0603  BP: 106/49 111/54 116/53   Pulse: 99 97 94   Temp: 98.1 F (36.7 C) 98.4 F (36.9 C) 97.6 F (36.4 C)   TempSrc: Oral Oral Oral   Resp: 18 16 16    Height:      Weight:   37.249 kg (82 lb 1.9 oz) 37.558 kg (82 lb 12.8 oz)  SpO2: 93% 92% 94%     Intake/Output Summary (Last 24 hours) at 04/27/13 1412 Last data filed at 04/27/13 7322  Gross per 24 hour  Intake  937.5 ml  Output    925 ml  Net   12.5 ml    Exam:   General:  Pt is alert, follows commands appropriately, not in acute distress  Cardiovascular: Regular rate and rhythm, S1/S2, no murmurs, no rubs, no gallops  Respiratory: Clear to auscultation bilaterally, no wheezing, no crackles, no rhonchi  Abdomen: Soft, non tender,  non distended, bowel sounds present, no guarding  Extremities: right arm swollen, pulses DP and PT palpable bilaterally  Neuro: Grossly nonfocal  Data Reviewed: Basic Metabolic Panel:  Recent Labs Lab 04/23/13 1650 04/24/13 0345 04/25/13 0407 04/26/13 0455 04/27/13 0330  NA 132* 133* 134* 133* 134*  K 3.8 3.8 3.0* 3.4* 3.7  CL 88* 92* 95* 97 98  CO2 29 30 28 26 25   GLUCOSE 129* 103* 97 98 86  BUN 28* 25* 17 11 7   CREATININE 0.67 0.57 0.54 0.50 0.44*  CALCIUM 7.8* 7.3* 7.0* 7.0* 6.7*  MG 1.7  --   --   --   --   PHOS 4.3  --   --   --   --    Liver Function  Tests:  Recent Labs Lab 04/23/13 1650 04/24/13 0345  AST 12 13  ALT 9 9  ALKPHOS 47 40  BILITOT 0.5 0.5  PROT 5.5* 4.9*  ALBUMIN 1.8* 1.6*   No results found for this basename: LIPASE, AMYLASE,  in the last 168 hours No results found for this basename: AMMONIA,  in the last 168 hours CBC:  Recent Labs Lab 04/23/13 1650 04/24/13 0345 04/26/13 0455 04/27/13 0330  WBC 1.9* 1.8* 1.5* 1.5*  NEUTROABS 1.6*  --   --   --   HGB 9.1* 8.1* 8.1* 7.6*  HCT 27.6* 24.9* 25.4* 23.4*  MCV 89.3 89.9 92.0 92.1  PLT 53* 50* 46* 56*   Cardiac Enzymes: No results found for this basename: CKTOTAL, CKMB, CKMBINDEX, TROPONINI,  in the last 168 hours BNP: No components found with this basename: POCBNP,  CBG:  Recent Labs Lab 04/24/13 0543 04/25/13 0629 04/26/13 0733 04/27/13 0744  GLUCAP 101* 82 97 97    Recent Results (from the past 240 hour(s))  URINE CULTURE     Status: None   Collection Time    04/24/13 12:09 AM      Result Value Range Status   Specimen Description URINE, CLEAN CATCH   Final   Special Requests NONE   Final   Culture  Setup Time     Final   Value: 04/24/2013 03:55     Performed at [facility]     Final   Value: 70,000 COLONIES/ML     Performed at [facility]   Culture     Final   Value: KLEBSIELLA PNEUMONIAE     Note: Confirmed Extended Spectrum Beta-Lactamase Producer (ESBL) CRITICAL RESULT CALLED TO, READ BACK BY AND VERIFIED WITH: DEKINA C. AT 12:55PM ON 04/27/2013 BY HAJAM     CITROBACTER FREUNDII     Performed at [facility]   Report Status 04/27/2013 FINAL   Final   Organism ID, Bacteria KLEBSIELLA PNEUMONIAE   Final   Organism ID, Bacteria CITROBACTER FREUNDII   Final  CULTURE, BLOOD (ROUTINE X 2)     Status: None   Collection Time    04/24/13 11:35 AM      Result Value Range Status   Specimen Description BLOOD RIGHT WRIST   Final   Special Requests BOTTLES DRAWN AEROBIC AND ANAEROBIC 5CC   Final    Culture  Setup Time     Final   Value: 04/24/2013 14:10     Performed at [facility]   Culture     Final   Value:        BLOOD CULTURE RECEIVED NO GROWTH TO DATE CULTURE WILL BE HELD FOR 5 DAYS  BEFORE ISSUING A FINAL NEGATIVE REPORT     Performed at [facility]   Report Status PENDING   Incomplete  CULTURE, BLOOD (ROUTINE X 2)     Status: None   Collection Time    04/24/13 11:40 AM      Result Value Range Status   Specimen Description BLOOD LEFT WRIST   Final   Special Requests BOTTLES DRAWN AEROBIC AND ANAEROBIC 3CC   Final   Culture  Setup Time     Final   Value: 04/24/2013 14:11     Performed at [facility]   Culture     Final   Value:        BLOOD CULTURE RECEIVED NO GROWTH TO DATE CULTURE WILL BE HELD FOR 5 DAYS BEFORE ISSUING A FINAL NEGATIVE REPORT     Performed at [facility]   Report Status PENDING   Incomplete     Studies: No results found.  Scheduled Meds: . estradiol  2 mg Oral Daily  . feeding supplement (PRO-STAT SUGAR FREE 64)  30 mL Oral BID WC  . feeding supplement (RESOURCE BREEZE)  1 Container Oral TID WC  . iohexol  25 mL Oral Q1 Hr x 2  . pantoprazole  40 mg Oral Daily  . piperacillin-tazobactam (ZOSYN)  IV  3.375 g Intravenous Q8H  . protein supplement  1 scoop Oral BID WC  . simvastatin  20 mg Oral q1800  . vancomycin  750 mg Intravenous Q12H   Continuous Infusions: . sodium chloride Stopped (04/27/13 1340)

## 2013-04-27 NOTE — Progress Notes (Signed)
CRITICAL VALUE ALERT  Critical value received:  ESBL in UA Cultures  Date of notification:  04/27/13  Time of notification:  9480  Critical value read back:  yes  Nurse who received alert:  Tilda Franco, RN  MD notified (1st page):  Charlies Silvers  Time of first page:  1258  MD notified (2nd page):  Time of second page:  Responding MD:    Time MD responded:

## 2013-04-27 NOTE — Progress Notes (Signed)
IP PROGRESS NOTE  Subjective:   She reports feeling much better. Diarrhea approximately once per day.  Objective: Vital signs in last 24 hours: Blood pressure 116/53, pulse 94, temperature 97.6 F (36.4 C), temperature source Oral, resp. rate 16, height 5\' 2"  (1.575 m), weight 82 lb 12.8 oz (37.558 kg), SpO2 94.00%.  Intake/Output from previous day: 02/08 0701 - 02/09 0700 In: 937.5 [P.O.:120; I.V.:767.5; IV Piggyback:50] Out: 925 [Urine:925]  Physical Exam:  HEENT: No thrush Lungs: Distant breath sounds, no respiratory distress Cardiac: Regular rate and rhythm Abdomen: No hepatosplenomegaly, no mass, Extremities: Trace pitting edema at the ankles bilaterally     Lab Results:  Recent Labs  04/26/13 0455 04/27/13 0330  WBC 1.5* 1.5*  HGB 8.1* 7.6*  HCT 25.4* 23.4*  PLT 46* 56*    BMET  Recent Labs  04/26/13 0455 04/27/13 0330  NA 133* 134*  K 3.4* 3.7  CL 97 98  CO2 26 25  GLUCOSE 98 86  BUN 11 7  CREATININE 0.50 0.44*  CALCIUM 7.0* 6.7*    Studies/Results: No results found.  Medications: I have reviewed the patient's current medications.  Assessment/Plan:  1. 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 2. Dehydration/failure to thrive-? Sepsis syndrome-clinical status has improved 3. Pancytopenia-likely secondary to chemotherapy/radiation versus a systemic infection-stable 4. CT evidence of a perirectal abscess 01/22/2013 5. COPD 6. Gram-negative rods on the urine culture 04/24/2013, identification pending  Her performance status appears improved. I encouraged her to increase her oral intake. We will check a CT of the abdomen and pelvis today to followup on the anal mass and perirectal fluid/gas collection.  Recommendations:  1. CT of the abdomen/pelvis to followup on the perirectal fluid collection noted on the  staging CT in November 2014 2. followup identification/sensitivity on the admission urine culture 3. increase ambulation/diet  I appreciate the care from Dr. Charlies Silvers   LOS: 4 days   [facility], [place]  04/27/2013, 12:30 PM

## 2013-04-27 NOTE — Progress Notes (Signed)
ANTIBIOTIC CONSULT NOTE - FOLLOW UP  Pharmacy Consult for Vancomycin Indication: rule out sepsis  Allergies  Allergen Reactions  . Aspirin Shortness Of Breath and Swelling  . Erythromycin Hives and Rash  . Niacin And Related Other (See Comments)    Redness all over     Patient Measurements: Height: 5\' 2"  (157.5 cm) Weight: 82 lb 4.8 oz (37.331 kg) IBW/kg (Calculated) : 50.1 Adjusted Body Weight:   Vital Signs: Temp: 98.4 F (36.9 C) (02/08 2030) Temp src: Oral (02/08 2030) BP: 111/54 mmHg (02/08 2030) Pulse Rate: 97 (02/08 2030) Intake/Output from previous day: 02/08 0701 - 02/09 0700 In: 937.5 [P.O.:120; I.V.:767.5; IV Piggyback:50] Out: 625 [Urine:625] Intake/Output from this shift: Total I/O In: -  Out: 325 [Urine:325]  Labs:  Recent Labs  04/24/13 0345 04/25/13 0407 04/26/13 0455  WBC 1.8*  --  1.5*  HGB 8.1*  --  8.1*  PLT 50*  --  46*  CREATININE 0.57 0.54 0.50   Estimated Creatinine Clearance: 34.7 ml/min (by C-G formula based on Cr of 0.5).  Recent Labs  04/26/13 1902  VANCOTROUGH 6.5*     Microbiology: Recent Results (from the past 720 hour(s))  TECHNOLOGIST REVIEW     Status: None   Collection Time    04/06/13  2:18 PM      Result Value Range Status   Technologist Review Rare Myelocyte. Toxic Gran with Vacuoles   Final  TECHNOLOGIST REVIEW     Status: None   Collection Time    04/15/13  1:12 PM      Result Value Range Status   Technologist Review     Final   Value: 1% myelocytes, toxic granulation and vacuolated neutrophils present  CLOSTRIDIUM DIFFICILE BY PCR     Status: None   Collection Time    04/15/13  4:38 PM      Result Value Range Status   C difficile by pcr Negative  Negative Final   Comment:  This assay detects the presence of Clostridium difficile DNA codingfor toxin B (tcdB) by real-time polymerase chain reaction (PCR)amplification.This test was developed and its performance characteristics have beendetermined by [employer].      Performance characteristics referto the analytical performance of the test. This test has not beencleared or approved by the [place] Food and Drug Administration. The FDAhas determined that such clearance or approval is not necessary. Thislaboratory is      certified under the [facility] as qualified to perform high complexity clinicallaboratory testing.  URINE CULTURE     Status: None   Collection Time    04/24/13 12:09 AM      Result Value Range Status   Specimen Description URINE, CLEAN CATCH   Final   Special Requests NONE   Final   Culture  Setup Time     Final   Value: 04/24/2013 03:55     Performed at [facility]     Final   Value: 70,000 COLONIES/ML     Performed at [facility]   Culture     Final   Value: [place]     Performed at [facility]   Report Status PENDING   Incomplete  CULTURE, BLOOD (ROUTINE X 2)     Status: None   Collection Time    04/24/13 11:35 AM      Result Value Range Status   Specimen Description BLOOD RIGHT WRIST   Final  Special Requests BOTTLES DRAWN AEROBIC AND ANAEROBIC 5CC   Final   Culture  Setup Time     Final   Value: 04/24/2013 14:10     Performed at [facility]   Culture     Final   Value:        BLOOD CULTURE RECEIVED NO GROWTH TO DATE CULTURE WILL BE HELD FOR 5 DAYS BEFORE ISSUING A FINAL NEGATIVE REPORT     Performed at [facility]   Report Status PENDING   Incomplete  CULTURE, BLOOD (ROUTINE X 2)     Status: None   Collection Time    04/24/13 11:40 AM      Result Value Range Status   Specimen Description BLOOD LEFT WRIST   Final   Special Requests BOTTLES DRAWN AEROBIC AND ANAEROBIC 3CC   Final   Culture  Setup Time     Final   Value: 04/24/2013 14:11     Performed at [facility]   Culture     Final   Value:        BLOOD CULTURE RECEIVED NO GROWTH TO DATE CULTURE WILL BE HELD FOR 5 DAYS BEFORE ISSUING  A FINAL NEGATIVE REPORT     Performed at [facility]   Report Status PENDING   Incomplete    Anti-infectives   Start     Dose/Rate Route Frequency Ordered Stop   04/27/13 0600  vancomycin (VANCOCIN) IVPB 750 mg/150 ml premix     750 mg 150 mL/hr over 60 Minutes Intravenous Every 12 hours 04/27/13 0150     04/24/13 0600  piperacillin-tazobactam (ZOSYN) IVPB 3.375 g     3.375 g 12.5 mL/hr over 240 Minutes Intravenous 3 times per day 04/23/13 1852     04/23/13 2000  vancomycin (VANCOCIN) IVPB 750 mg/150 ml premix     750 mg 150 mL/hr over 60 Minutes Intravenous Every 24 hours 04/23/13 1852     04/23/13 1930  piperacillin-tazobactam (ZOSYN) IVPB 3.375 g     3.375 g 12.5 mL/hr over 240 Minutes Intravenous STAT 04/23/13 1852 04/23/13 2356      Assessment: Patient with low vancomycin level.  Dosing charted correctly.  Goal of Therapy:  Vancomycin trough level 15-20 mcg/ml  Plan:  Measure antibiotic drug levels at steady state Follow up culture results Change vancomycin to 750mg  iv q12hr, next dose at [place], [place] St[place] 04/27/2013,1:52 AM

## 2013-04-27 NOTE — Progress Notes (Signed)
*  Preliminary Results* Right upper extremity venous duplex completed. Right upper extremity is positive for deep and superficial vein thrombosis involving the right subclavian, right brachial, and right basilic veins.  04/27/2013 4:45 PM  Maudry Mayhew, RVT, RDCS, RDMS

## 2013-04-28 DIAGNOSIS — I82729 Chronic embolism and thrombosis of deep veins of unspecified upper extremity: Secondary | ICD-10-CM

## 2013-04-28 DIAGNOSIS — I82409 Acute embolism and thrombosis of unspecified deep veins of unspecified lower extremity: Secondary | ICD-10-CM

## 2013-04-28 DIAGNOSIS — R197 Diarrhea, unspecified: Secondary | ICD-10-CM

## 2013-04-28 DIAGNOSIS — N390 Urinary tract infection, site not specified: Secondary | ICD-10-CM

## 2013-04-28 DIAGNOSIS — B961 Klebsiella pneumoniae [K. pneumoniae] as the cause of diseases classified elsewhere: Secondary | ICD-10-CM

## 2013-04-28 LAB — CBC
HEMATOCRIT: 23 % — AB (ref 36.0–46.0)
HEMOGLOBIN: 7.5 g/dL — AB (ref 12.0–15.0)
MCH: 29.8 pg (ref 26.0–34.0)
MCHC: 32.6 g/dL (ref 30.0–36.0)
MCV: 91.3 fL (ref 78.0–100.0)
Platelets: 49 10*3/uL — ABNORMAL LOW (ref 150–400)
RBC: 2.52 MIL/uL — ABNORMAL LOW (ref 3.87–5.11)
RDW: 15.7 % — ABNORMAL HIGH (ref 11.5–15.5)
WBC: 1.8 10*3/uL — ABNORMAL LOW (ref 4.0–10.5)

## 2013-04-28 LAB — BASIC METABOLIC PANEL
BUN: 4 mg/dL — AB (ref 6–23)
CHLORIDE: 96 meq/L (ref 96–112)
CO2: 27 mEq/L (ref 19–32)
Calcium: 6.7 mg/dL — ABNORMAL LOW (ref 8.4–10.5)
Creatinine, Ser: 0.45 mg/dL — ABNORMAL LOW (ref 0.50–1.10)
GFR calc non Af Amer: >90 mL/min (ref >90)
GLUCOSE: 79 mg/dL (ref 70–99)
POTASSIUM: 2.5 meq/L — AB (ref 3.7–5.3)
SODIUM: 135 meq/L — AB (ref 137–147)

## 2013-04-28 LAB — POTASSIUM: Potassium: 3.3 mEq/L — ABNORMAL LOW (ref 3.7–5.3)

## 2013-04-28 LAB — GLUCOSE, CAPILLARY: GLUCOSE-CAPILLARY: 73 mg/dL (ref 70–99)

## 2013-04-28 LAB — PREPARE RBC (CROSSMATCH)

## 2013-04-28 MED ORDER — POTASSIUM CHLORIDE CRYS ER 20 MEQ PO TBCR
40.0000 meq | EXTENDED_RELEASE_TABLET | ORAL | Status: AC
  Administered 2013-04-28 (×2): 40 meq via ORAL
  Filled 2013-04-28 (×2): qty 2

## 2013-04-28 MED ORDER — SODIUM CHLORIDE 0.9 % IV SOLN
125.0000 mg | Freq: Three times a day (TID) | INTRAVENOUS | Status: DC
  Administered 2013-04-28 – 2013-04-30 (×5): 125 mg via INTRAVENOUS
  Filled 2013-04-28 (×6): qty 125

## 2013-04-28 MED ORDER — POTASSIUM CHLORIDE 10 MEQ/100ML IV SOLN
10.0000 meq | INTRAVENOUS | Status: DC
  Administered 2013-04-28 (×2): 10 meq via INTRAVENOUS
  Filled 2013-04-28 (×5): qty 100

## 2013-04-28 MED ORDER — SODIUM CHLORIDE 0.9 % IV SOLN
250.0000 mg | Freq: Once | INTRAVENOUS | Status: AC
  Administered 2013-04-28: 250 mg via INTRAVENOUS
  Filled 2013-04-28: qty 250

## 2013-04-28 NOTE — Progress Notes (Signed)
IP PROGRESS NOTE  Subjective:   She continues to feel better. However she had diarrhea prior to and following the CT yesterday. No pain.  Objective: Vital signs in last 24 hours: Blood pressure 113/51, pulse 98, temperature 98 F (36.7 C), temperature source Oral, resp. rate 18, height 5\' 2"  (1.575 m), weight 83 lb (37.649 kg), SpO2 94.00%.  Intake/Output from previous day: 02/09 0701 - 02/10 0700 In: 930 [P.O.:480; I.V.:450] Out: 450 [Urine:450]  Physical Exam:  HEENT: No thrush Abdomen: No hepatosplenomegaly, no mass, nontender Extremities: Trace pitting edema at the ankles bilaterally, pitting edema and mild erythema at the right forearm     Lab Results:  Recent Labs  04/27/13 0330 04/28/13 0400  WBC 1.5* 1.8*  HGB 7.6* 7.5*  HCT 23.4* 23.0*  PLT 56* 49*    BMET  Recent Labs  04/27/13 0330 04/28/13 0400  NA 134* 135*  K 3.7 2.5*  CL 98 96  CO2 25 27  GLUCOSE 86 79  BUN 7 4*  CREATININE 0.44* 0.45*  CALCIUM 6.7* 6.7*    Studies/Results: Ct Abdomen Pelvis W Contrast  04/27/2013   CLINICAL DATA:  H/O anal cancer, f/u perictal fluid collection, ?abscess  EXAM: CT ABDOMEN AND PELVIS WITH CONTRAST  TECHNIQUE: Multidetector CT imaging of the abdomen and pelvis was performed using the standard protocol following bolus administration of intravenous contrast.  CONTRAST:  22mL OMNIPAQUE IOHEXOL 300 MG/ML  SOLN  COMPARISON:  None.  FINDINGS: Small bilateral pleural effusions are appreciated. Centrilobular emphysematous changes are appreciated as well as thickening of the interstitial markings. A very vague ground-glass background is appreciated within the lung bases. A moderate to large hiatal hernia is appreciated.  The very small low attenuating focus within the anterior dome of the right lobe of the liver described on previous study is not clearly appreciated on the present study. A stable small ill-defined low attenuating focus projects adjacent to the falciform  ligament on the right. Best seen image 19 series 2. Statistically this likely reflects an area of focal fatty infiltration. Surveillance evaluation of this finding on subsequent imaging is recommended. A stable small calcified granuloma projects in the posterior aspect the right lobe liver image 16 series 2. The liver otherwise appears to be unremarkable.  The spleen, right adrenal, pancreas are unremarkable. The right kidney is unremarkable. An extrarenal pelvis identified within the left kidney which is otherwise unremarkable. The left adrenal demonstrates a nodular appearance still maintains an adreniform shape. A more focal nodular component is identified within the posterior medial limb of the adrenal image 16 series 2 demonstrating Hounsfield units of 58. This finding is indeterminate differential considerations are a small adrenal adenoma.  Atherosclerotic calcifications identified within the aorta. There is no evidence of abdominal aortic aneurysm. The celiac, SMA, IMA, portal vein, SMV are opacified.  There is no evidence of bowel obstruction. There are multiple areas of bowel wall thickening within scattered loops of small bowel. No associated drainable loculated fluid collections are appreciated. There is a small amount of free fluid within the pelvis. The loculated collection adjacent to the anus has decreased in size when compared to the previous study with a decreased air component. The poorly defined residual of measures approximately 1.6 x 1.2 cm in AP by transverse dimensions. There is residual bowel wall thickening within the distal sigmoid and rectal anal portions of the colon.  There is no evidence of abdominal or pelvic adenopathy nor masses. Subcentimeter lymph nodes are appreciated within the porta  hepatis region and retroperitoneal regions.  There is no evidence of abdominal wall nor inguinal hernia. There is no evidence of aggressive appearing osseous lesions.  Subcutaneous fluid is  appreciated within the soft tissues of the lower back. There is no evidence of a loculated component. There is also evidence of infiltration within the subcutaneous fat of the abdominal wall and pelvic wall regions.  IMPRESSION: 1. Resolving perirectal fluid collection/abscess. Persistent distal colitis is appreciated. There also findings which reflect either reactive edema within loops of small bowel versus a concomitant enteritis. Continued surveillance evaluation recommended. 2. Indeterminate small nodule within the left adrenal this finding may be further characterized with adrenal protocol MRI. 3. Likely area of focal fatty infiltration adjacent to the falciform ligament 4. Small to moderate hiatal hernia. 5. Small bilateral effusions as well as emphysematous and interstitial changes within the lung bases 6. Fluid within the subcutaneous fat of the lower lumbar and upper pelvic regions posteriorly. Slightly may reflect dependent edema particularly if the patient is chronically supine. Areas of infiltration within the subcutaneous fat which may reflect developing anasarca clinically appropriate.   Electronically Signed   By: Margaree Mackintosh M.D.   On: 04/27/2013 16:12    Medications: I have reviewed the patient's current medications.  Assessment/Plan:  1. 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 2. Dehydration/failure to thrive-? Sepsis syndrome-clinical status has improved 3. Pancytopenia-likely secondary to chemotherapy/radiation-stable 4. CT evidence of a perirectal abscess 01/22/2013, improved on the CT 04/27/2013 5. COPD 6. resistant Klebsiella pneumoniae and Citrobacter urinary tract infection 7. 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 Right arm DVT-on  No evidence of progressive cancer on the restaging CT. The pancytopenia is most likely related  to chemotherapy/radiation and malnutrition. This will likely take weeks to improve.  Recommendations:  1. increase ambulation 2. antibiotics for the urinary tract infection per Dr. Charlies Silvers 3. Increase antidiarrheal regimen 4. Continue Lovenox for the DVT  I updated the family at the bedside.   LOS: 5 days   [place]  04/28/2013, 2:14 PM

## 2013-04-28 NOTE — Progress Notes (Signed)
ANTIBIOTIC CONSULT NOTE - Initial  Pharmacy Consult for Imipenem Indication: UTI  Allergies  Allergen Reactions  . Aspirin Shortness Of Breath and Swelling  . Erythromycin Hives and Rash  . Niacin And Related Other (See Comments)    Redness all over     Patient Measurements: Height: 5\' 2"  (157.5 cm) Weight: 83 lb (37.649 kg) IBW/kg (Calculated) : 50.1  Vital Signs: Temp: 97.3 F (36.3 C) (02/10 0637) Temp src: Oral (02/10 0637) BP: 104/50 mmHg (02/10 0637) Pulse Rate: 91 (02/10 0637) Intake/Output from previous day: 02/09 0701 - 02/10 0700 In: 930 [P.O.:480; I.V.:450] Out: 450 [Urine:450]  Labs:  Recent Labs  04/26/13 0455 04/27/13 0330 04/28/13 0400  WBC 1.5* 1.5* 1.8*  HGB 8.1* 7.6* 7.5*  PLT 46* 56* 49*  CREATININE 0.50 0.44* 0.45*   Estimated Creatinine Clearance: 35 ml/min (by C-G formula based on Cr of 0.45).  Recent Labs  04/26/13 1902  VANCOTROUGH 6.5*     Assessment: 80yo F with hx rectal cancer admitted with dehydration and FTT. Pharmacy asked to dose Vancomycin and Zosyn for possible sepsis on 2/5, Zosyn changed to Primaxin on 2/10 with confirmed ESBL in urine culture and vancomycin discontinued by MD.   Weight 37kg  Antiinfectives D6 antibiotics 2/5 >> Vanc >> 2/10 2/5 >> Zosyn >> 2/10 2/10 >> imipenem >>  Tmax: remains afebrile WBCs: remain low at 1.8 Renal: SCr stable at 0.45, 35CG   Microbiology 2/6: Urine: 70k ESBL K.pneumo (S=LVQ, imipenem, gent, Zosyn; I=cipro, tobra; R=amp, cefs, Macrobid, Bactrim) AND citrobacter (S=CTX, cipro, gent, LVQ, Macrobid, Zosyn, tobra, Bactrim; R=cefazolin only) 2/6 blood x 2: NGTD  Dose changes/drug level info: 2/8 VT at 1900 = 6.5 on vancomycin 750mg  q24h, dose increased   Goal of Therapy:  Appropriate imipenem dosing for weight, renal function, indication  Plan:  - start imipenem 250mg  IV x1 - imipenem 125mg  IV q8h to start 2/10 at 2200 - discontinue Zosyn - follow-up clinical course,  culture results, renal function - follow-up antibiotic de-escalation and length of therapy  Thank you for the consult.  Johny Drilling, PharmD, BCPS Pager: [phone] Pharmacy: [phone] 04/28/2013 10:40 AM

## 2013-04-28 NOTE — Progress Notes (Addendum)
[facility] PROGRESS NOTE  TRANAE Cochran [id number] DOB: 21-May-1933 DOA: 04/23/2013 PCP: Alonza Bogus, MD  Brief narrative: 80 year old female with past medical history of squamous cell carcinoma of anal region, undergoing chemotherapy (under Dr. Gearldine Shown care) and underwent RT who presented as a direct admission from cancer center due to severe malnutrition and dehydration. Hospital course complicated with UTI Klebsiella and citrobacter. Additionally, she was found to have right arm DVT and was started on Lovenox.   Assessment and Plan:   Principal Problem:  Dehydration, failure to thrive, diarrhea  - likely due to malignancy, poor oral intake, diarrhea  - C.diff negative by PCR  - we will continue supportive care with IV fluids  - nutrition consulted; encouraged PO intake   Active Problems:  Squamous cell cancer, anal region  - management per oncology  - underwent RT, completed 04/06/2013  - cycle 2 chemo 03/23/2013  - repeat CT abdomen showed no evidence of progressive cancer on the restaging CT Right arm swelling  - doppler study positive for DVT  - swelling better this am - Lovenox per pharmacy  Perirectal abscess  - seen on on CT abdomen 01/22/2013  - based on repeat CT abd 04/27/2013 there is resolving perirectal fluid collection/abscess Possible sepsis  - low temp on admission, 94.5 F; now T 98 F  - likely due to UTI as urine culture growing Klebsiella and Citrobacter  - on broad spectrum antibiotics, vanco and zosyn; vanco was stopped 2/9 and zosyn 2/10; we started imipenem 2/10 based on sensitivity report   - blood cultures show no growth to date  - CXR did not reveal acute cardiopulmonary porcess  Anemia of chronic disease  - secondary to history of malignancy  - hemoglobin 8.1 --> 7.6 --> 7.5 - no specific recommendation for transfusion per oncology; she is pale and feels weak; will transfuse 1 unit PRBC today Neutropenia  - no fever but low temp, 94.5 F  and WBC count 1.5  - likely due to malnutrition  Thrombocytopenia  - likely sequela of chemotherapy  - platelets range 46 -56 - using SCD's for DVT prophylaxis  Hypokalemia  - repleted today for potassium of 2.5 Hyponatremia  - likely due to dehydration  - may continue IV fluids - sodium 135 this am Dyslipidemia - continue statin therapy  Severe protein calorie malnutrition  - nutrition consulted  - encourage PO intake   Code Status: Full  Family Communication: family at the bedside  Disposition Plan: home when stable, updated family daily at the bedside   Consultants:  Oncology  Procedures:  None  Antibiotics:  Vancomycin 04/23/2013 --> 04/27/2013 Zosyn 2/5/201 5--> 04/28/2013 Imipenem 04/28/2013 -->  Leisa Lenz, MD  [facility] Pager [phone]  If 7PM-7AM, please contact night-coverage [email or web address] Password [email or web address] 04/28/2013, 2:33 PM   LOS: 5 days    HPI/Subjective: Had few episodes of diarrhea this am.  Objective: Filed Vitals:   04/27/13 1400 04/27/13 2148 04/28/13 0637 04/28/13 1332  BP: 108/60 115/55 104/50 113/51  Pulse: 99 101 91 98  Temp: 98.2 F (36.8 C) 98.6 F (37 C) 97.3 F (36.3 C) 98 F (36.7 C)  TempSrc: Oral Oral Oral Oral  Resp: 18 18 18 18   Height:      Weight:   37.649 kg (83 lb)   SpO2: 97% 95% 93% 94%    Intake/Output Summary (Last 24 hours) at 04/28/13 1433 Last data filed at 04/28/13 1200  Gross per 24 hour  Intake  720 ml  Output    450 ml  Net    270 ml    Exam:   General:  Pt is alert, follows commands appropriately, not in acute distress  Cardiovascular: Regular rate and rhythm, S1/S2 appreciated   Respiratory: Clear to auscultation bilaterally, no wheezing, no crackles, no rhonchi  Abdomen: Soft, non tender, non distended, bowel sounds present, no guarding  Extremities: trace pedal edema, pulses DP and PT palpable bilaterally  Neuro: Grossly nonfocal  Data Reviewed: Basic Metabolic Panel:  Recent  Labs Lab 04/23/13 1650 04/24/13 0345 04/25/13 0407 04/26/13 0455 04/27/13 0330 04/28/13 0400  NA 132* 133* 134* 133* 134* 135*  K 3.8 3.8 3.0* 3.4* 3.7 2.5*  CL 88* 92* 95* 97 98 96  CO2 29 30 28 26 25 27   GLUCOSE 129* 103* 97 98 86 79  BUN 28* 25* 17 11 7  4*  CREATININE 0.67 0.57 0.54 0.50 0.44* 0.45*  CALCIUM 7.8* 7.3* 7.0* 7.0* 6.7* 6.7*  MG 1.7  --   --   --   --   --   PHOS 4.3  --   --   --   --   --    Liver Function Tests:  Recent Labs Lab 04/23/13 1650 04/24/13 0345  AST 12 13  ALT 9 9  ALKPHOS 47 40  BILITOT 0.5 0.5  PROT 5.5* 4.9*  ALBUMIN 1.8* 1.6*   No results found for this basename: LIPASE, AMYLASE,  in the last 168 hours No results found for this basename: AMMONIA,  in the last 168 hours CBC:  Recent Labs Lab 04/23/13 1650 04/24/13 0345 04/26/13 0455 04/27/13 0330 04/28/13 0400  WBC 1.9* 1.8* 1.5* 1.5* 1.8*  NEUTROABS 1.6*  --   --   --   --   HGB 9.1* 8.1* 8.1* 7.6* 7.5*  HCT 27.6* 24.9* 25.4* 23.4* 23.0*  MCV 89.3 89.9 92.0 92.1 91.3  PLT 53* 50* 46* 56* 49*   Cardiac Enzymes: No results found for this basename: CKTOTAL, CKMB, CKMBINDEX, TROPONINI,  in the last 168 hours BNP: No components found with this basename: POCBNP,  CBG:  Recent Labs Lab 04/24/13 0543 04/25/13 0629 04/26/13 0733 04/27/13 0744 04/28/13 0800  GLUCAP 101* 82 97 97 73    URINE CULTURE     Status: None   Collection Time    04/24/13 12:09 AM      Result Value Range Status   Specimen Description URINE, CLEAN CATCH   Final   Organism ID, Bacteria KLEBSIELLA PNEUMONIAE   Final   Organism ID, Bacteria CITROBACTER FREUNDII   Final  CULTURE, BLOOD (ROUTINE X 2)     Status: None   Collection Time    04/24/13 11:35 AM      Result Value Range Status   Specimen Description BLOOD RIGHT WRIST   Final   Value:        BLOOD CULTURE RECEIVED NO GROWTH TO DATE CULTURE WILL BE HELD FOR 5 DAYS BEFORE ISSUING A FINAL NEGATIVE REPORT     Performed at [facility]    Report Status PENDING   Incomplete  CULTURE, BLOOD (ROUTINE X 2)     Status: None   Collection Time    04/24/13 11:40 AM      Result Value Range Status   Specimen Description BLOOD LEFT WRIST   Final   Value:        BLOOD CULTURE RECEIVED NO GROWTH TO DATE CULTURE WILL BE HELD FOR 5  DAYS BEFORE ISSUING A FINAL NEGATIVE REPORT     Performed at [facility]   Report Status PENDING   Incomplete     Studies: Ct Abdomen Pelvis W Contrast 04/27/2013     IMPRESSION: 1. Resolving perirectal fluid collection/abscess. Persistent distal colitis is appreciated. There also findings which reflect either reactive edema within loops of small bowel versus a concomitant enteritis. Continued surveillance evaluation recommended. 2. Indeterminate small nodule within the left adrenal this finding may be further characterized with adrenal protocol MRI. 3. Likely area of focal fatty infiltration adjacent to the falciform ligament 4. Small to moderate hiatal hernia. 5. Small bilateral effusions as well as emphysematous and interstitial changes within the lung bases 6. Fluid within the subcutaneous fat of the lower lumbar and upper pelvic regions posteriorly. Slightly may reflect dependent edema particularly if the patient is chronically supine. Areas of infiltration within the subcutaneous fat which may reflect developing anasarca clinically appropriate.      Scheduled Meds: . enoxaparin (LOVENOX) injection  1 mg/kg Subcutaneous Q12H  . estradiol  2 mg Oral Daily  . feeding supplement (PRO-STAT SUGAR FREE 64)  30 mL Oral BID WC  . feeding supplement (RESOURCE BREEZE)  1 Container Oral TID WC  . imipenem-cilastatin  125 mg Intravenous Q8H  . pantoprazole  40 mg Oral Daily  . protein supplement  1 scoop Oral BID WC  . simvastatin  20 mg Oral q1800   Continuous Infusions: . sodium chloride Stopped (04/27/13 1340)

## 2013-04-28 NOTE — Progress Notes (Signed)
CRITICAL VALUE ALERT  Critical value received:  K+ 2.5  Date of notification:  04/28/13  Time of notification:  2244  Critical value read back:yes  Nurse who received alert:  Harlow Asa  MD notified (1st page):  hospitalist on call  Time of first page:  0515  MD notified (2nd page):  Time of second page:  Responding MD:  Hospitalist on call  Time MD responded:  [phone]

## 2013-04-29 DIAGNOSIS — A419 Sepsis, unspecified organism: Principal | ICD-10-CM

## 2013-04-29 DIAGNOSIS — D709 Neutropenia, unspecified: Secondary | ICD-10-CM

## 2013-04-29 DIAGNOSIS — D696 Thrombocytopenia, unspecified: Secondary | ICD-10-CM

## 2013-04-29 LAB — CALCIUM, IONIZED: Calcium, Ion: 0.96 mmol/L — ABNORMAL LOW (ref 1.13–1.30)

## 2013-04-29 LAB — BASIC METABOLIC PANEL
BUN: 3 mg/dL — ABNORMAL LOW (ref 6–23)
CO2: 24 mEq/L (ref 19–32)
CREATININE: 0.39 mg/dL — AB (ref 0.50–1.10)
Calcium: 6.4 mg/dL — CL (ref 8.4–10.5)
Chloride: 99 mEq/L (ref 96–112)
GFR calc Af Amer: >90 mL/min (ref >90)
GFR calc non Af Amer: >90 mL/min (ref >90)
Glucose, Bld: 89 mg/dL (ref 70–99)
Potassium: 3.1 mEq/L — ABNORMAL LOW (ref 3.7–5.3)
Sodium: 136 mEq/L — ABNORMAL LOW (ref 137–147)

## 2013-04-29 LAB — GLUCOSE, CAPILLARY: GLUCOSE-CAPILLARY: 87 mg/dL (ref 70–99)

## 2013-04-29 LAB — TYPE AND SCREEN
ABO/RH(D): A NEG
Antibody Screen: NEGATIVE
DAT, IgG: NEGATIVE
Unit division: 0

## 2013-04-29 LAB — CBC
HCT: 24.8 % — ABNORMAL LOW (ref 36.0–46.0)
Hemoglobin: 8.2 g/dL — ABNORMAL LOW (ref 12.0–15.0)
MCH: 29.5 pg (ref 26.0–34.0)
MCHC: 33.1 g/dL (ref 30.0–36.0)
MCV: 89.2 fL (ref 78.0–100.0)
Platelets: 51 10*3/uL — ABNORMAL LOW (ref 150–400)
RBC: 2.78 MIL/uL — ABNORMAL LOW (ref 3.87–5.11)
RDW: 15.5 % (ref 11.5–15.5)
WBC: 2.4 10*3/uL — ABNORMAL LOW (ref 4.0–10.5)

## 2013-04-29 LAB — ABO/RH: ABO/RH(D): A NEG

## 2013-04-29 LAB — ALBUMIN: Albumin: 1.6 g/dL — ABNORMAL LOW (ref 3.5–5.2)

## 2013-04-29 MED ORDER — POTASSIUM CHLORIDE CRYS ER 20 MEQ PO TBCR
40.0000 meq | EXTENDED_RELEASE_TABLET | Freq: Two times a day (BID) | ORAL | Status: DC
Start: 2013-04-29 — End: 2013-04-29
  Filled 2013-04-29: qty 2

## 2013-04-29 MED ORDER — DRONABINOL 2.5 MG PO CAPS
2.5000 mg | ORAL_CAPSULE | Freq: Two times a day (BID) | ORAL | Status: DC
  Administered 2013-04-29 – 2013-05-01 (×4): 2.5 mg via ORAL
  Filled 2013-04-29 (×4): qty 1

## 2013-04-29 MED ORDER — LEVOFLOXACIN 250 MG PO TABS
250.0000 mg | ORAL_TABLET | Freq: Every day | ORAL | Status: DC
  Administered 2013-04-29 – 2013-05-01 (×3): 250 mg via ORAL
  Filled 2013-04-29 (×3): qty 1

## 2013-04-29 MED ORDER — POTASSIUM CHLORIDE CRYS ER 20 MEQ PO TBCR
20.0000 meq | EXTENDED_RELEASE_TABLET | Freq: Once | ORAL | Status: AC
  Administered 2013-04-30: 20 meq via ORAL
  Filled 2013-04-29: qty 1

## 2013-04-29 MED ORDER — POTASSIUM CHLORIDE CRYS ER 20 MEQ PO TBCR
40.0000 meq | EXTENDED_RELEASE_TABLET | Freq: Two times a day (BID) | ORAL | Status: DC
  Filled 2013-04-29: qty 2

## 2013-04-29 MED ORDER — SODIUM CHLORIDE 0.9 % IV SOLN
INTRAVENOUS | Status: DC
  Administered 2013-04-29 – 2013-04-30 (×2): via INTRAVENOUS
  Administered 2013-05-01: 75 mL/h via INTRAVENOUS

## 2013-04-29 MED ORDER — POTASSIUM CHLORIDE IN NACL 40-0.9 MEQ/L-% IV SOLN
INTRAVENOUS | Status: DC
  Administered 2013-04-29: 18:00:00 via INTRAVENOUS
  Filled 2013-04-29 (×2): qty 1000

## 2013-04-29 MED ORDER — CALCIUM CARBONATE 1250 (500 CA) MG PO TABS
1.0000 | ORAL_TABLET | Freq: Two times a day (BID) | ORAL | Status: DC
  Administered 2013-04-29 – 2013-05-01 (×4): 500 mg via ORAL
  Filled 2013-04-29 (×6): qty 1

## 2013-04-29 MED ORDER — POTASSIUM CHLORIDE 20 MEQ/15ML (10%) PO LIQD
40.0000 meq | Freq: Once | ORAL | Status: DC
  Filled 2013-04-29: qty 30

## 2013-04-29 MED ORDER — POTASSIUM CHLORIDE CRYS ER 20 MEQ PO TBCR
40.0000 meq | EXTENDED_RELEASE_TABLET | Freq: Once | ORAL | Status: AC
  Administered 2013-04-29: 40 meq via ORAL
  Filled 2013-04-29: qty 2

## 2013-04-29 NOTE — Progress Notes (Addendum)
PROGRESS NOTE   Sara Cochran NAT:[id number] DOB: [birth year] DOA: 04/23/2013 PCP: Alonza Bogus, MD  Brief narrative: Sara Cochran is an 80 y.o. female with a PMH of squamous cell carcinoma of the anal region, status post radiation treatment as well as chemotherapy, last chemotherapy 03/23/13 with mitomycin and fluorouracil, who was admitted directly from the [facility] on 04/23/13 with dehydration, malnutrition and failure to thrive. She was also found to have a Klebsiella and Citrobacter urinary tract infection as well as a right arm DVT.  Assessment/Plan: Principal Problem:   Sepsis with dehydration secondary to colitis/diarrhea versus UTI Patient was hypothermic on admission. CT scan of the abdomen showed resolving perirectal fluid collection/abscess but persistent distal colitis Stool studies negative for C. difficile. Continue supportive care with IV fluids and nutritional support. The patient has completed 6 days of IV antibiotics (initially vancomycin/Zosyn), currently on Primaxin. Persistent diarrhea may be related to radiation enteritis versus persistent distal colitis from an infectious source.  Continue antibiotics for now. Antidiarrheals as needed. Active Problems:   Hypocalcemia Ionized calcium confirms low. Start calcium supplementation.   Hypokalemia We'll add potassium to IV fluids. Check magnesium.   Weakness/failure to thrive Physical therapy recommending home health physical therapy at discharge.   Anal cancer Being followed by Dr. Benay Spice of oncology. Completed radiation therapy 04/06/13 and had second cycle of chemotherapy 04/23/13. Repeat restaging CT scan of the abdomen did not show evidence of disease progression.   Pancytopenia (anemia, thrombocytopenia) with Neutropenia Pancytopenia related to the sequela of prior chemotherapy. Absolute neutrophil count 1.6 on admission. No current indication for Neupogen or blood/platelet transfusion. Received one unit of packed  red blood cells 04/28/13.   HTN (hypertension) Controlled on Norvasc and Avapro.   Anxiety state, unspecified Continue Xanax as needed.   Severe protein-calorie malnutrition / underweight Seen by dietitian 04/24/13. Continue supplements. Add Marinol.   DVT (deep venous thrombosis) of the right upper extremity Continue therapeutic dose Lovenox.  Code Status: Full. Family Communication: [name] at bedside. Disposition Plan: Home when stable.   IV access:  Peripheral IV  Medical Consultants:  Dr. Julieanne Manson, Oncology  Other Consultants:  Physical therapy  Dietitian  Anti-infectives:  Primaxin 04/28/13--->  Vancomycin 04/23/13---> 04/27/13  Zosyn 04/23/13> 04/28/13 ---  HPI/Subjective: Sara Cochran continues to have loose stools. Appetite is fair. No complaints of pain. No dyspnea or cough.  Objective: Filed Vitals:   04/29/13 0015 04/29/13 0115 04/29/13 0210 04/29/13 0524  BP: 120/54 134/66 151/87 115/43  Pulse: 96 98 93 94  Temp: 98.2 F (36.8 C) 98.1 F (36.7 C) 97.9 F (36.6 C) 97.3 F (36.3 C)  TempSrc: Oral Oral Oral Oral  Resp: 18 18 16 16   Height:      Weight:    44.135 kg (97 lb 4.8 oz)  SpO2: 95% 95% 95% 95%    Intake/Output Summary (Last 24 hours) at 04/29/13 1432 Last data filed at 04/29/13 1424  Gross per 24 hour  Intake 2580.75 ml  Output    400 ml  Net 2180.75 ml    Exam: Gen:  NAD, cachectic Cardiovascular:  RRR, No M/R/G Respiratory:  Lungs CTAB Gastrointestinal:  Abdomen soft, NT/ND, + BS Extremities:  Right upper extremity swollen, no pedal edema  Data Reviewed: Basic Metabolic Panel:  Recent Labs Lab 04/23/13 1650  04/25/13 0407 04/26/13 0455 04/27/13 0330 04/28/13 0400 04/28/13 1615 04/29/13 0540  NA 132*  < > 134* 133* 134* 135*  --  136*  K  3.8  < > 3.0* 3.4* 3.7 2.5* 3.3* 3.1*  CL 88*  < > 95* 97 98 96  --  99  CO2 29  < > 28 26 25 27   --  24  GLUCOSE 129*  < > 97 98 86 79  --  89  BUN 28*  < > 17 11 7  4*   --  3*  CREATININE 0.67  < > 0.54 0.50 0.44* 0.45*  --  0.39*  CALCIUM 7.8*  < > 7.0* 7.0* 6.7* 6.7*  --  6.4*  MG 1.7  --   --   --   --   --   --   --   PHOS 4.3  --   --   --   --   --   --   --   < > = values in this interval not displayed. GFR Estimated Creatinine Clearance: 41 ml/min (by C-G formula based on Cr of 0.39). Liver Function Tests:  Recent Labs Lab 04/23/13 1650 04/24/13 0345 04/29/13 0820  AST 12 13  --   ALT 9 9  --   ALKPHOS 47 40  --   BILITOT 0.5 0.5  --   PROT 5.5* 4.9*  --   ALBUMIN 1.8* 1.6* 1.6*   Coagulation profile  Recent Labs Lab 04/23/13 1650  INR 1.66*    CBC:  Recent Labs Lab 04/23/13 1650 04/24/13 0345 04/26/13 0455 04/27/13 0330 04/28/13 0400 04/29/13 0656  WBC 1.9* 1.8* 1.5* 1.5* 1.8* 2.4*  NEUTROABS 1.6*  --   --   --   --   --   HGB 9.1* 8.1* 8.1* 7.6* 7.5* 8.2*  HCT 27.6* 24.9* 25.4* 23.4* 23.0* 24.8*  MCV 89.3 89.9 92.0 92.1 91.3 89.2  PLT 53* 50* 46* 56* 49* 51*   CBG:  Recent Labs Lab 04/25/13 0629 04/26/13 0733 04/27/13 0744 04/28/13 0800 04/29/13 0809  GLUCAP 82 97 97 73 79   Microbiology Recent Results (from the past 240 hour(s))  URINE CULTURE     Status: None   Collection Time    04/24/13 12:09 AM      Result Value Ref Range Status   Specimen Description URINE, CLEAN CATCH   Final   Special Requests NONE   Final   Culture  Setup Time     Final   Value: 04/24/2013 03:55     Performed at [facility]     Final   Value: 70,000 COLONIES/ML     Performed at [facility]   Culture     Final   Value: KLEBSIELLA PNEUMONIAE     Note: Confirmed Extended Spectrum Beta-Lactamase Producer (ESBL) CRITICAL RESULT CALLED TO, READ BACK BY AND VERIFIED WITH: DEKINA C. AT 12:55PM ON 04/27/2013 BY HAJAM     CITROBACTER FREUNDII     Performed at [facility]   Report Status 04/27/2013 FINAL   Final   Organism ID, Bacteria KLEBSIELLA PNEUMONIAE   Final   Organism ID, Bacteria  CITROBACTER FREUNDII   Final  CULTURE, BLOOD (ROUTINE X 2)     Status: None   Collection Time    04/24/13 11:35 AM      Result Value Ref Range Status   Specimen Description BLOOD RIGHT WRIST   Final   Special Requests BOTTLES DRAWN AEROBIC AND ANAEROBIC 5CC   Final   Culture  Setup Time     Final   Value: 04/24/2013 14:10  Performed at [facility]     Final   Value:        BLOOD CULTURE RECEIVED NO GROWTH TO DATE CULTURE WILL BE HELD FOR 5 DAYS BEFORE ISSUING A FINAL NEGATIVE REPORT     Performed at [facility]   Report Status PENDING   Incomplete  CULTURE, BLOOD (ROUTINE X 2)     Status: None   Collection Time    04/24/13 11:40 AM      Result Value Ref Range Status   Specimen Description BLOOD LEFT WRIST   Final   Special Requests BOTTLES DRAWN AEROBIC AND ANAEROBIC 3CC   Final   Culture  Setup Time     Final   Value: 04/24/2013 14:11     Performed at [facility]   Culture     Final   Value:        BLOOD CULTURE RECEIVED NO GROWTH TO DATE CULTURE WILL BE HELD FOR 5 DAYS BEFORE ISSUING A FINAL NEGATIVE REPORT     Performed at [facility]   Report Status PENDING   Incomplete     Procedures and Diagnostic Studies: Ct Abdomen Pelvis W Contrast  04/27/2013   CLINICAL DATA:  H/O anal cancer, f/u perictal fluid collection, ?abscess  EXAM: CT ABDOMEN AND PELVIS WITH CONTRAST  TECHNIQUE: Multidetector CT imaging of the abdomen and pelvis was performed using the standard protocol following bolus administration of intravenous contrast.  CONTRAST:  54mL OMNIPAQUE IOHEXOL 300 MG/ML  SOLN  COMPARISON:  None.  FINDINGS: Small bilateral pleural effusions are appreciated. Centrilobular emphysematous changes are appreciated as well as thickening of the interstitial markings. A very vague ground-glass background is appreciated within the lung bases. A moderate to large hiatal hernia is appreciated.  The very small low attenuating focus within the anterior  dome of the right lobe of the liver described on previous study is not clearly appreciated on the present study. A stable small ill-defined low attenuating focus projects adjacent to the falciform ligament on the right. Best seen image 19 series 2. Statistically this likely reflects an area of focal fatty infiltration. Surveillance evaluation of this finding on subsequent imaging is recommended. A stable small calcified granuloma projects in the posterior aspect the right lobe liver image 16 series 2. The liver otherwise appears to be unremarkable.  The spleen, right adrenal, pancreas are unremarkable. The right kidney is unremarkable. An extrarenal pelvis identified within the left kidney which is otherwise unremarkable. The left adrenal demonstrates a nodular appearance still maintains an adreniform shape. A more focal nodular component is identified within the posterior medial limb of the adrenal image 16 series 2 demonstrating Hounsfield units of 58. This finding is indeterminate differential considerations are a small adrenal adenoma.  Atherosclerotic calcifications identified within the aorta. There is no evidence of abdominal aortic aneurysm. The celiac, SMA, IMA, portal vein, SMV are opacified.  There is no evidence of bowel obstruction. There are multiple areas of bowel wall thickening within scattered loops of small bowel. No associated drainable loculated fluid collections are appreciated. There is a small amount of free fluid within the pelvis. The loculated collection adjacent to the anus has decreased in size when compared to the previous study with a decreased air component. The poorly defined residual of measures approximately 1.6 x 1.2 cm in AP by transverse dimensions. There is residual bowel wall thickening within the distal sigmoid and rectal anal portions of the colon.  There is  no evidence of abdominal or pelvic adenopathy nor masses. Subcentimeter lymph nodes are appreciated within the porta  hepatis region and retroperitoneal regions.  There is no evidence of abdominal wall nor inguinal hernia. There is no evidence of aggressive appearing osseous lesions.  Subcutaneous fluid is appreciated within the soft tissues of the lower back. There is no evidence of a loculated component. There is also evidence of infiltration within the subcutaneous fat of the abdominal wall and pelvic wall regions.  IMPRESSION: 1. Resolving perirectal fluid collection/abscess. Persistent distal colitis is appreciated. There also findings which reflect either reactive edema within loops of small bowel versus a concomitant enteritis. Continued surveillance evaluation recommended. 2. Indeterminate small nodule within the left adrenal this finding may be further characterized with adrenal protocol MRI. 3. Likely area of focal fatty infiltration adjacent to the falciform ligament 4. Small to moderate hiatal hernia. 5. Small bilateral effusions as well as emphysematous and interstitial changes within the lung bases 6. Fluid within the subcutaneous fat of the lower lumbar and upper pelvic regions posteriorly. Slightly may reflect dependent edema particularly if the patient is chronically supine. Areas of infiltration within the subcutaneous fat which may reflect developing anasarca clinically appropriate.   Electronically Signed   By: Margaree Mackintosh M.D.   On: 04/27/2013 16:12   Portable Chest 1 View  04/23/2013   CLINICAL DATA:  80 year old female cough and congestion. Initial encounter.  EXAM: PORTABLE CHEST - 1 VIEW  COMPARISON:  05/03/2008.  FINDINGS: Portable AP upright view at 1745 hrs. Chronic large lung volumes with attenuated bronchovascular markings in keeping with emphysema. Normal cardiac size and mediastinal contours. Visualized tracheal air column is within normal limits. No pneumothorax, pulmonary edema, pleural effusion or confluent pulmonary opacity.  IMPRESSION: Chronic hyperinflation. No acute cardiopulmonary  abnormality identified.   Electronically Signed   By: Lars Pinks M.D.   On: 04/23/2013 18:18    Scheduled Meds: . enoxaparin (LOVENOX) injection  1 mg/kg Subcutaneous Q12H  . estradiol  2 mg Oral Daily  . feeding supplement (PRO-STAT SUGAR FREE 64)  30 mL Oral BID WC  . feeding supplement (RESOURCE BREEZE)  1 Container Oral TID WC  . imipenem-cilastatin  125 mg Intravenous 3 times per day  . pantoprazole  40 mg Oral Daily  . protein supplement  1 scoop Oral BID WC  . simvastatin  20 mg Oral q1800   Continuous Infusions: . sodium chloride 75 mL/hr at 04/28/13 1811    Time spent: 35 minutes with > 50% of time discussing current diagnostic test results, clinical impression and plan of care.    LOS: 6 days   [facility] Pager [phone]. If unable to reach me by pager, please call my cell phone at [phone].  *Please note that the hospitalists switch teams on Wednesdays. Please call the flow manager at [phone] if you are having difficulty reaching the hospitalist taking care of this patient as she can update you and provide the most up-to-date pager number of provider caring for the patient. If 8PM-8AM, please contact night-coverage at [email or web address], password [facility]  04/29/2013, 2:32 PM    **Disclaimer: This note was dictated with voice recognition software. Similar sounding words can inadvertently be transcribed and this note may contain transcription errors which may not have been corrected upon publication of note.**     In an effort to keep you and your family informed about your hospital stay, I am providing you with this information sheet. If you or your  family have any questions, please do not hesitate to have the nursing staff page me to set up a meeting time.  Sara Cochran 04/29/2013 6 (Number of days in the hospital)  Treatment team:  Dr. Jacquelynn Cree, Hospitalist (Internist)  Dr. Julieanne Manson, Oncology  Active Treatment Issues with  Plan: Principal Problem:   Dehydration and sepsis secondary to radiation-induced versus infectious colitis with diarrhea versus UTI CT scan of the abdomen 04/27/13 showed resolving perirectal fluid collection/abscess but persistent distal colitis. Stool studies negative for C. difficile. Continue supportive care with IV fluids and nutritional support. You have completed 6 days of IV antibiotics (initially vancomycin/Zosyn), currently on Primaxin but we are going to change this to oral therapy today. Persistent diarrhea may be related to radiation enteritis versus persistent distal colitis from an infectious source, though at this point, felt to be more likely due to radiation.  Continue antibiotics for now. Continue Antidiarrheals as needed. Active Problems:   Low calcium We are starting a calcium supplement today.   Weakness Physical therapy recommending home health physical therapy at discharge.   Rectal cancer Being followed by Dr. Benay Spice of oncology. Completed radiation therapy 04/06/13 and had second cycle of chemotherapy 04/23/13. Repeat restaging CT scan of the abdomen did not show evidence of disease progression.   Low blood counts Related to recent chemotherapy. No current indication for blood/platelet transfusion. Hemoglobin is 8.2, white blood cell count 2.4, and platelet count 51 today.   High blood pressure Controlled on Norvasc and Avapro.   Malnutrition/ underweight Seen by dietitian 04/24/13. Continue supplements.   Blood clot of the right arm Continue therapeutic dose Lovenox.  Anticipated discharge date: 04/30/13.

## 2013-04-30 DIAGNOSIS — N390 Urinary tract infection, site not specified: Secondary | ICD-10-CM | POA: Diagnosis present

## 2013-04-30 DIAGNOSIS — E876 Hypokalemia: Secondary | ICD-10-CM | POA: Diagnosis present

## 2013-04-30 DIAGNOSIS — R5381 Other malaise: Secondary | ICD-10-CM

## 2013-04-30 DIAGNOSIS — R197 Diarrhea, unspecified: Secondary | ICD-10-CM | POA: Diagnosis present

## 2013-04-30 DIAGNOSIS — K5289 Other specified noninfective gastroenteritis and colitis: Secondary | ICD-10-CM

## 2013-04-30 DIAGNOSIS — K529 Noninfective gastroenteritis and colitis, unspecified: Secondary | ICD-10-CM | POA: Diagnosis present

## 2013-04-30 LAB — CULTURE, BLOOD (ROUTINE X 2)
CULTURE: NO GROWTH
Culture: NO GROWTH

## 2013-04-30 LAB — CBC
HCT: 28 % — ABNORMAL LOW (ref 36.0–46.0)
Hemoglobin: 9.1 g/dL — ABNORMAL LOW (ref 12.0–15.0)
MCH: 29.5 pg (ref 26.0–34.0)
MCHC: 32.5 g/dL (ref 30.0–36.0)
MCV: 90.9 fL (ref 78.0–100.0)
PLATELETS: 67 10*3/uL — AB (ref 150–400)
RBC: 3.08 MIL/uL — AB (ref 3.87–5.11)
RDW: 16 % — AB (ref 11.5–15.5)
WBC: 2.6 10*3/uL — AB (ref 4.0–10.5)

## 2013-04-30 LAB — BASIC METABOLIC PANEL
BUN: 3 mg/dL — ABNORMAL LOW (ref 6–23)
CALCIUM: 6.4 mg/dL — AB (ref 8.4–10.5)
CO2: 26 meq/L (ref 19–32)
Chloride: 100 mEq/L (ref 96–112)
Creatinine, Ser: 0.36 mg/dL — ABNORMAL LOW (ref 0.50–1.10)
Glucose, Bld: 91 mg/dL (ref 70–99)
Potassium: 3.1 mEq/L — ABNORMAL LOW (ref 3.7–5.3)
SODIUM: 137 meq/L (ref 137–147)

## 2013-04-30 LAB — GLUCOSE, CAPILLARY: GLUCOSE-CAPILLARY: 91 mg/dL (ref 70–99)

## 2013-04-30 LAB — MAGNESIUM: Magnesium: 1 mg/dL — ABNORMAL LOW (ref 1.5–2.5)

## 2013-04-30 LAB — ALBUMIN: Albumin: 1.4 g/dL — ABNORMAL LOW (ref 3.5–5.2)

## 2013-04-30 MED ORDER — CALCIUM GLUCONATE 10 % IV SOLN
1.0000 g | Freq: Once | INTRAVENOUS | Status: AC
  Administered 2013-04-30: 1 g via INTRAVENOUS
  Filled 2013-04-30: qty 10

## 2013-04-30 MED ORDER — POTASSIUM CHLORIDE 10 MEQ/100ML IV SOLN
10.0000 meq | INTRAVENOUS | Status: AC
  Administered 2013-04-30: 10 meq via INTRAVENOUS
  Filled 2013-04-30 (×3): qty 100

## 2013-04-30 MED ORDER — DIPHENOXYLATE-ATROPINE 2.5-0.025 MG PO TABS
1.0000 | ORAL_TABLET | Freq: Four times a day (QID) | ORAL | Status: DC
  Administered 2013-04-30 – 2013-05-01 (×4): 1 via ORAL
  Filled 2013-04-30 (×4): qty 1

## 2013-04-30 MED ORDER — MAGNESIUM SULFATE 4000MG/100ML IJ SOLN
4.0000 g | Freq: Once | INTRAMUSCULAR | Status: AC
  Administered 2013-04-30: 4 g via INTRAVENOUS
  Filled 2013-04-30: qty 100

## 2013-04-30 MED ORDER — POTASSIUM CHLORIDE CRYS ER 20 MEQ PO TBCR
20.0000 meq | EXTENDED_RELEASE_TABLET | Freq: Three times a day (TID) | ORAL | Status: AC
  Administered 2013-04-30 (×3): 20 meq via ORAL
  Filled 2013-04-30 (×3): qty 1

## 2013-04-30 MED ORDER — ALBUTEROL SULFATE (2.5 MG/3ML) 0.083% IN NEBU
2.5000 mg | INHALATION_SOLUTION | RESPIRATORY_TRACT | Status: DC | PRN
  Administered 2013-04-30 – 2013-05-01 (×2): 2.5 mg via RESPIRATORY_TRACT
  Filled 2013-04-30 (×2): qty 3

## 2013-04-30 MED ORDER — ENSURE COMPLETE PO LIQD
237.0000 mL | Freq: Three times a day (TID) | ORAL | Status: DC
  Administered 2013-05-01: 237 mL via ORAL

## 2013-04-30 NOTE — Progress Notes (Signed)
Physical Therapy Treatment Patient Details Name: AUDREENA SACHDEVA MRN: [id number] DOB: 21-Mar-1933 Today's Date: 04/30/2013 Time: 2952-8413 PT Time Calculation (min): 20 min  PT Assessment / Plan / Recommendation  History of Present Illness 80 year old female with past medical history of squamous cell carcinoma of anal region, undergoing chemotherapy (under Dr. Gearldine Shown care) and underwent RT who presented as a direct admission from cancer center due to severe malnutrition and dehydration. Pt reported feeling extremely weak and per family having intermittent confusions. Pt also reported having loose stools, abdominal discomfort and nausea and vomiting.     PT Comments   Pt tolerating short distance ambulation. Pt reports she is hopeful of going home soon.  Follow Up Recommendations  Home health PT     Does the patient have the potential to tolerate intense rehabilitation     Barriers to Discharge        Equipment Recommendations  None recommended by PT    Recommendations for Other Services    Frequency Min 3X/week   Progress towards PT Goals Progress towards PT goals: Progressing toward goals  Plan Current plan remains appropriate    Precautions / Restrictions Precautions Precautions: Fall Precaution Comments: monitor HR and sats   Pertinent Vitals/Pain HR 122, sats 90% Ra after ambulating in room. sats returned to 94 with  Rest.    Mobility  Bed Mobility Supine to sit: Supervision;HOB elevated General bed mobility comments: extra time and resting breaks for SOB. Transfers Overall transfer level: Needs assistance Equipment used: Rolling walker (2 wheeled) Transfers: Sit to/from Stand Sit to Stand: Min guard General transfer comment: extra time for SOB, pacing self. Ambulation/Gait Ambulation/Gait assistance: Min guard Ambulation Distance (Feet): 50 Feet Assistive device: Rolling walker (2 wheeled) Gait Pattern/deviations: Step-through pattern General Gait Details:  ambulated as tolerated - pt does pace self, stops for  breathing.    Exercises     PT Diagnosis:    PT Problem List:   PT Treatment Interventions:     PT Goals (current goals can now be found in the care plan section)    Visit Information  Last PT Received On: 04/30/13 Assistance Needed: +1 History of Present Illness: 80 year old female with past medical history of squamous cell carcinoma of anal region, undergoing chemotherapy (under Dr. Gearldine Shown care) and underwent RT who presented as a direct admission from cancer center due to severe malnutrition and dehydration. Pt reported feeling extremely weak and per family having intermittent confusions. Pt also reported having loose stools, abdominal discomfort and nausea and vomiting.      Subjective Data   I know I have to go slow. My daughter in law knows what I can do. I don't think I need HH.I have to take it slow.   Cognition  Cognition Arousal/Alertness: Awake/alert Behavior During Therapy: WFL for tasks assessed/performed    Balance     End of Session PT - End of Session Equipment Utilized During Treatment: Gait belt Activity Tolerance: Patient tolerated treatment well Patient left: in chair;with call bell/phone within reach Nurse Communication: Mobility status   GP     Claretha Cooper 04/30/2013, 8:50 AM Tresa Endo PT [phone]

## 2013-04-30 NOTE — Progress Notes (Addendum)
PROGRESS NOTE   Sara Cochran [id number] DOB: 1934/01/09 DOA: 04/23/2013 PCP: Alonza Bogus, MD  Brief narrative: Sara Cochran is an 80 y.o. female with a PMH of squamous cell carcinoma of the anal region, status post radiation treatment as well as chemotherapy, last chemotherapy 03/23/13 with mitomycin and fluorouracil, who was admitted directly from the [facility] on 04/23/13 with dehydration, malnutrition and failure to thrive. She was also found to have a Klebsiella and Citrobacter urinary tract infection as well as a right arm DVT.  Assessment/Plan: Principal Problem:   Sepsis with dehydration secondary to colitis/diarrhea versus UTI Patient was hypothermic on admission. CT scan of the abdomen showed resolving perirectal fluid collection/abscess but persistent distal colitis Stool studies negative for C. difficile. Continue supportive care with IV fluids and nutritional support. The patient has completed 6 days of IV antibiotics (initially vancomycin/Zosyn followed by Primaxin), currently on oral Levaquin. Persistent diarrhea may be related to radiation enteritis versus persistent distal colitis from an infectious source.  Continue antibiotics for now. Antidiarrheals as needed. Active Problems:   Hypocalcemia Ionized calcium confirms low. Started calcium supplementation 04/29/13, will give a dose of calcium gluconate today.   Hypokalemia / Hypomagnesemia. Continue to replace potassium. 4 g of magnesium been given.    Weakness/failure to thrive Physical therapy recommending home health physical therapy at discharge.   Anal cancer Being followed by Dr. Benay Spice of oncology. Completed radiation therapy 04/06/13 and had second cycle of chemotherapy 04/23/13. Repeat restaging CT scan of the abdomen did not show evidence of disease progression.   Pancytopenia (anemia, thrombocytopenia) with Neutropenia Pancytopenia related to the sequela of prior chemotherapy. Absolute neutrophil count 1.6  on admission. No current indication for Neupogen or blood/platelet transfusion. Received one unit of packed red blood cells 04/28/13. Counts beginning to rise.   HTN (hypertension) Controlled on Norvasc and Avapro.   Anxiety state, unspecified Continue Xanax as needed.   Severe protein-calorie malnutrition / underweight Seen by dietitian 04/24/13. Continue supplements. Add Marinol.   DVT (deep venous thrombosis) of the right upper extremity Continue therapeutic dose Lovenox.  Code Status: Full. Family Communication: Daughter and [name] at bedside. Disposition Plan: Home when stable.   IV access:  Peripheral IV  Medical Consultants:  Dr. Julieanne Manson, Oncology  Other Consultants:  Physical therapy  Dietitian  Anti-infectives:  Primaxin 04/28/13--->  Vancomycin 04/23/13---> 04/27/13  Zosyn 04/23/13> 04/28/13 ---  HPI/Subjective: Sara Cochran continues to have loose stools but reports this is better. Appetite is fair. No complaints of pain. No dyspnea or cough. Still very weak.  Objective: Filed Vitals:   04/29/13 0524 04/29/13 1318 04/29/13 2236 04/30/13 0556  BP: 115/43 131/59 124/60 132/60  Pulse: 94 95 104 101  Temp: 97.3 F (36.3 C) 98.6 F (37 C) 98.5 F (36.9 C) 98.2 F (36.8 C)  TempSrc: Oral Oral Oral Oral  Resp: 16 16 20 20   Height:      Weight: 44.135 kg (97 lb 4.8 oz)     SpO2: 95% 93% 94% 94%    Intake/Output Summary (Last 24 hours) at 04/30/13 1111 Last data filed at 04/30/13 0900  Gross per 24 hour  Intake 3496.25 ml  Output    300 ml  Net 3196.25 ml    Exam: Gen:  NAD, cachectic Cardiovascular:  RRR, No M/R/G Respiratory:  Lungs CTAB Gastrointestinal:  Abdomen soft, NT/ND, + BS Extremities:  Right upper extremity swollen, no pedal edema  Data Reviewed: Basic Metabolic Panel:  Recent Labs  Lab 04/23/13 1650  04/26/13 0455 04/27/13 0330 04/28/13 0400  04/29/13 0540 04/30/13 0445  NA 132*  < > 133* 134* 135*  --  136* 137  K  3.8  < > 3.4* 3.7 2.5*  < > 3.1* 3.1*  CL 88*  < > 97 98 96  --  99 100  CO2 29  < > 26 25 27   --  24 26  GLUCOSE 129*  < > 98 86 79  --  89 91  BUN 28*  < > 11 7 4*  --  3* 3*  CREATININE 0.67  < > 0.50 0.44* 0.45*  --  0.39* 0.36*  CALCIUM 7.8*  < > 7.0* 6.7* 6.7*  --  6.4* 6.4*  MG 1.7  --   --   --   --   --   --  1.0*  PHOS 4.3  --   --   --   --   --   --   --   < > = values in this interval not displayed. GFR Estimated Creatinine Clearance: 41 ml/min (by C-G formula based on Cr of 0.36). Liver Function Tests:  Recent Labs Lab 04/23/13 1650 04/24/13 0345 04/29/13 0820 04/30/13 0533  AST 12 13  --   --   ALT 9 9  --   --   ALKPHOS 47 40  --   --   BILITOT 0.5 0.5  --   --   PROT 5.5* 4.9*  --   --   ALBUMIN 1.8* 1.6* 1.6* 1.4*   Coagulation profile  Recent Labs Lab 04/23/13 1650  INR 1.66*    CBC:  Recent Labs Lab 04/23/13 1650  04/26/13 0455 04/27/13 0330 04/28/13 0400 04/29/13 0656 04/30/13 0445  WBC 1.9*  < > 1.5* 1.5* 1.8* 2.4* 2.6*  NEUTROABS 1.6*  --   --   --   --   --   --   HGB 9.1*  < > 8.1* 7.6* 7.5* 8.2* 9.1*  HCT 27.6*  < > 25.4* 23.4* 23.0* 24.8* 28.0*  MCV 89.3  < > 92.0 92.1 91.3 89.2 90.9  PLT 53*  < > 46* 56* 49* 51* 67*  < > = values in this interval not displayed. CBG:  Recent Labs Lab 04/26/13 0733 04/27/13 0744 04/28/13 0800 04/29/13 0809 04/30/13 0735  GLUCAP 97 97 73 87 91   Microbiology Recent Results (from the past 240 hour(s))  URINE CULTURE     Status: None   Collection Time    04/24/13 12:09 AM      Result Value Ref Range Status   Specimen Description URINE, CLEAN CATCH   Final   Special Requests NONE   Final   Culture  Setup Time     Final   Value: 04/24/2013 03:55     Performed at [facility]     Final   Value: 70,000 COLONIES/ML     Performed at [facility]   Culture     Final   Value: KLEBSIELLA PNEUMONIAE     Note: Confirmed Extended Spectrum Beta-Lactamase Producer  (ESBL) CRITICAL RESULT CALLED TO, READ BACK BY AND VERIFIED WITH: DEKINA C. AT 12:55PM ON 04/27/2013 BY HAJAM     CITROBACTER FREUNDII     Performed at [facility]   Report Status 04/27/2013 FINAL   Final   Organism ID, Bacteria KLEBSIELLA PNEUMONIAE   Final   Organism ID, Bacteria CITROBACTER FREUNDII  Final  CULTURE, BLOOD (ROUTINE X 2)     Status: None   Collection Time    04/24/13 11:35 AM      Result Value Ref Range Status   Specimen Description BLOOD RIGHT WRIST   Final   Special Requests BOTTLES DRAWN AEROBIC AND ANAEROBIC 5CC   Final   Culture  Setup Time     Final   Value: 04/24/2013 14:10     Performed at [facility]   Culture     Final   Value: NO GROWTH 5 DAYS     Performed at [facility]   Report Status 04/30/2013 FINAL   Final  CULTURE, BLOOD (ROUTINE X 2)     Status: None   Collection Time    04/24/13 11:40 AM      Result Value Ref Range Status   Specimen Description BLOOD LEFT WRIST   Final   Special Requests BOTTLES DRAWN AEROBIC AND ANAEROBIC 3CC   Final   Culture  Setup Time     Final   Value: 04/24/2013 14:11     Performed at [facility]   Culture     Final   Value: NO GROWTH 5 DAYS     Performed at [facility]   Report Status 04/30/2013 FINAL   Final     Procedures and Diagnostic Studies: Ct Abdomen Pelvis W Contrast  04/27/2013   CLINICAL DATA:  H/O anal cancer, f/u perictal fluid collection, ?abscess  EXAM: CT ABDOMEN AND PELVIS WITH CONTRAST  TECHNIQUE: Multidetector CT imaging of the abdomen and pelvis was performed using the standard protocol following bolus administration of intravenous contrast.  CONTRAST:  10mL OMNIPAQUE IOHEXOL 300 MG/ML  SOLN  COMPARISON:  None.  FINDINGS: Small bilateral pleural effusions are appreciated. Centrilobular emphysematous changes are appreciated as well as thickening of the interstitial markings. A very vague ground-glass background is appreciated within the lung bases. A  moderate to large hiatal hernia is appreciated.  The very small low attenuating focus within the anterior dome of the right lobe of the liver described on previous study is not clearly appreciated on the present study. A stable small ill-defined low attenuating focus projects adjacent to the falciform ligament on the right. Best seen image 19 series 2. Statistically this likely reflects an area of focal fatty infiltration. Surveillance evaluation of this finding on subsequent imaging is recommended. A stable small calcified granuloma projects in the posterior aspect the right lobe liver image 16 series 2. The liver otherwise appears to be unremarkable.  The spleen, right adrenal, pancreas are unremarkable. The right kidney is unremarkable. An extrarenal pelvis identified within the left kidney which is otherwise unremarkable. The left adrenal demonstrates a nodular appearance still maintains an adreniform shape. A more focal nodular component is identified within the posterior medial limb of the adrenal image 16 series 2 demonstrating Hounsfield units of 58. This finding is indeterminate differential considerations are a small adrenal adenoma.  Atherosclerotic calcifications identified within the aorta. There is no evidence of abdominal aortic aneurysm. The celiac, SMA, IMA, portal vein, SMV are opacified.  There is no evidence of bowel obstruction. There are multiple areas of bowel wall thickening within scattered loops of small bowel. No associated drainable loculated fluid collections are appreciated. There is a small amount of free fluid within the pelvis. The loculated collection adjacent to the anus has decreased in size when compared to the previous study with a decreased air component. The poorly defined residual  of measures approximately 1.6 x 1.2 cm in AP by transverse dimensions. There is residual bowel wall thickening within the distal sigmoid and rectal anal portions of the colon.  There is no evidence  of abdominal or pelvic adenopathy nor masses. Subcentimeter lymph nodes are appreciated within the porta hepatis region and retroperitoneal regions.  There is no evidence of abdominal wall nor inguinal hernia. There is no evidence of aggressive appearing osseous lesions.  Subcutaneous fluid is appreciated within the soft tissues of the lower back. There is no evidence of a loculated component. There is also evidence of infiltration within the subcutaneous fat of the abdominal wall and pelvic wall regions.  IMPRESSION: 1. Resolving perirectal fluid collection/abscess. Persistent distal colitis is appreciated. There also findings which reflect either reactive edema within loops of small bowel versus a concomitant enteritis. Continued surveillance evaluation recommended. 2. Indeterminate small nodule within the left adrenal this finding may be further characterized with adrenal protocol MRI. 3. Likely area of focal fatty infiltration adjacent to the falciform ligament 4. Small to moderate hiatal hernia. 5. Small bilateral effusions as well as emphysematous and interstitial changes within the lung bases 6. Fluid within the subcutaneous fat of the lower lumbar and upper pelvic regions posteriorly. Slightly may reflect dependent edema particularly if the patient is chronically supine. Areas of infiltration within the subcutaneous fat which may reflect developing anasarca clinically appropriate.   Electronically Signed   By: Margaree Mackintosh M.D.   On: 04/27/2013 16:12   Portable Chest 1 View  04/23/2013   CLINICAL DATA:  80 year old female cough and congestion. Initial encounter.  EXAM: PORTABLE CHEST - 1 VIEW  COMPARISON:  05/03/2008.  FINDINGS: Portable AP upright view at 1745 hrs. Chronic large lung volumes with attenuated bronchovascular markings in keeping with emphysema. Normal cardiac size and mediastinal contours. Visualized tracheal air column is within normal limits. No pneumothorax, pulmonary edema, pleural  effusion or confluent pulmonary opacity.  IMPRESSION: Chronic hyperinflation. No acute cardiopulmonary abnormality identified.   Electronically Signed   By: Lars Pinks M.D.   On: 04/23/2013 18:18    Scheduled Meds: . calcium carbonate  1 tablet Oral BID WC  . dronabinol  2.5 mg Oral BID AC  . enoxaparin (LOVENOX) injection  1 mg/kg Subcutaneous Q12H  . estradiol  2 mg Oral Daily  . feeding supplement (PRO-STAT SUGAR FREE 64)  30 mL Oral BID WC  . feeding supplement (RESOURCE BREEZE)  1 Container Oral TID WC  . levofloxacin  250 mg Oral Daily  . pantoprazole  40 mg Oral Daily  . potassium chloride  20 mEq Oral TID  . protein supplement  1 scoop Oral BID WC  . simvastatin  20 mg Oral q1800   Continuous Infusions: . sodium chloride 75 mL/hr at 04/29/13 2154    Time spent: 35 minutes with > 50% of time discussing current diagnostic test results, clinical impression and plan of care with the patient's daughter.    LOS: 7 days   [facility] Pager [phone]. If unable to reach me by pager, please call my cell phone at [phone].  *Please note that the hospitalists switch teams on Wednesdays. Please call the flow manager at [phone] if you are having difficulty reaching the hospitalist taking care of this patient as she can update you and provide the most up-to-date pager number of provider caring for the patient. If 8PM-8AM, please contact night-coverage at [email or web address], password [facility]  04/30/2013, 11:11 AM    **Disclaimer: This  note was dictated with voice recognition software. Similar sounding words can inadvertently be transcribed and this note may contain transcription errors which may not have been corrected upon publication of note.**     In an effort to keep you and your family informed about your hospital stay, I am providing you with this information sheet. If you or your family have any questions, please do not hesitate to have the nursing staff page me  to set up a meeting time.  Sara Cochran 04/30/2013 7 (Number of days in the hospital)  Treatment team:  Dr. Jacquelynn Cree, Hospitalist (Internist)  Dr. Julieanne Manson, Oncology  Active Treatment Issues with Plan: Principal Problem:   Dehydration and sepsis secondary to radiation-induced versus infectious colitis with diarrhea versus UTI CT scan of the abdomen 04/27/13 showed resolving perirectal fluid collection/abscess but persistent distal colitis. Stool studies negative for C. difficile. Continue supportive care with IV fluids and nutritional support. You have completed 6 days of IV antibiotics (initially vancomycin/Zosyn), currently on by mouth Levaquin. Persistent diarrhea may be related to radiation induced inflammation of the bowel versus an infection in the colon, though at this point, felt to be more likely due to radiation.  Continue antibiotics for now. Continue Antidiarrheals as needed. Active Problems:   Low calcium, low magnesium, low potassium Her being treated with calcium, magnesium and potassium supplements.   Weakness Physical therapy recommending home health physical therapy at discharge.   Rectal cancer Being followed by Dr. Benay Spice of oncology. Completed radiation therapy 04/06/13 and had second cycle of chemotherapy 04/23/13. Repeat restaging CT scan of the abdomen did not show evidence of disease progression.   Low blood counts Related to recent chemotherapy. No current indication for blood/platelet transfusion. Hemoglobin is 9.1, white blood cell count 2.6, and platelet count 67 today.   High blood pressure Controlled on Norvasc and Avapro.   Malnutrition/ underweight Seen by dietitian 04/24/13. Continue supplements.   Blood clot of the right arm Continue therapeutic dose Lovenox.  Anticipated discharge date: 05/01/13

## 2013-04-30 NOTE — Progress Notes (Signed)
NUTRITION FOLLOW UP  Intervention:   Recommend chocolate/vanilla Ensure Complete Will d/c Resource Breeze/Pro-stat/Beneprotein supplements per pt request Recommend 8PM snack Encouraged pt's family to order [employer] with meals Continue with appetite stimulant  Nutrition Dx:   Inadequate oral intake related to food aversions/loose stools as evidenced by PO intake < 75% for > one month, unintentional wt loss- ongoing   Goal:   Pt to meet >/= 90% of their estimated nutrition needs    Monitor:   Total protein/energy intake, labs, weights, GI profile  Assessment:   2/06: -Pt with 6 weeks of decreased appetite, loose stools, and abd pain per pt's son  -Daily intake would consist of applesauce that pt consumed with pills  -Diet intake has significantly decreased as pt experienced loose stools/GI distress after consuming liquids and solid foods, resulting in pt restricting all food items  -Has tried Ensure/Boost, but pt unable to tolerate  -Usual body weight is 115 lbs. Has lost 33 lbs in past six weeks after beginning chemo and radiation treatments  -Son assisted in pt's breakfast this morning. Noted improvement in appetite, ate approximately 75% of oatmeal, and a few bites of eggs. Denied any current n/v  -Pt willing to try [employer] as supplement alternative. Enjoyed sample taste  -Will trial Beneprotein and Pro-stat to increase nutrient density of food items. Family in agreement to promote supplement  -May benefit from appetite stimulant d/t prolonged period of sub-optimal intake, significant weight loss, and evident signs of wasting    2/12: -Pt reported continued declining of supplements. No longer like taste -Daughter reported that pt willing to re-try chocolate or vanilla Ensure. In agreement to promote supplement -RN noted pt very picky with meals/textures of foods. Consuming approximately 25% of meals. Lunch was 1/4 of pizza and 100% of ice cream cup. Encouraged pt's family  to order MagicCup as pt enjoys sweets and would be higher protein content than regular ice cream -Offered additional snack options. Pt in agreement for peanut butter crackers at 8PM. Informed RN to provide snack -Continues to have loose stools -Pt started on Marinol on 2/11. Will continue to monitor PO intake and its effectiveness   Height: Ht Readings from Last 1 Encounters:  04/23/13 5\' 2"  (1.575 m)    Weight Status:   Wt Readings from Last 1 Encounters:  04/29/13 97 lb 4.8 oz (44.135 kg)  04/24/13 82 lbs  Re-estimated needs:  Kcal: 1200-1400 Protein: 65-75 gram Fluid: >/= 1400 ml/daily  Skin: Stage 2 sacral pressure ulcer   Diet Order: General   Intake/Output Summary (Last 24 hours) at 04/30/13 1510 Last data filed at 04/30/13 0900  Gross per 24 hour  Intake   1740 ml  Output    300 ml  Net   1440 ml    Last BM: 2/11   Labs:   Recent Labs Lab 04/23/13 1650  04/28/13 0400 04/28/13 1615 04/29/13 0540 04/30/13 0445  NA 132*  < > 135*  --  136* 137  K 3.8  < > 2.5* 3.3* 3.1* 3.1*  CL 88*  < > 96  --  99 100  CO2 29  < > 27  --  24 26  BUN 28*  < > 4*  --  3* 3*  CREATININE 0.67  < > 0.45*  --  0.39* 0.36*  CALCIUM 7.8*  < > 6.7*  --  6.4* 6.4*  MG 1.7  --   --   --   --  1.0*  PHOS  4.3  --   --   --   --   --   GLUCOSE 129*  < > 79  --  89 91  < > = values in this interval not displayed.  CBG (last 3)   Recent Labs  04/28/13 0800 04/29/13 0809 04/30/13 0735  GLUCAP 73 87 91    Scheduled Meds: . calcium carbonate  1 tablet Oral BID WC  . dronabinol  2.5 mg Oral BID AC  . enoxaparin (LOVENOX) injection  1 mg/kg Subcutaneous Q12H  . estradiol  2 mg Oral Daily  . feeding supplement (ENSURE COMPLETE)  237 mL Oral TID WC  . levofloxacin  250 mg Oral Daily  . pantoprazole  40 mg Oral Daily  . potassium chloride  20 mEq Oral TID  . simvastatin  20 mg Oral q1800    Continuous Infusions: . sodium chloride 75 mL/hr at 04/29/13 2154    [place] Clinical Dietitian [phone]

## 2013-04-30 NOTE — Care Management Note (Signed)
Cm received call from [facility] rep Lurlean Leyden was notified by pt's daughter, per pt and family choice [facility] to provided [place] upon discharge. Awaiting MD orders for RN/PT. No dme recommended.    Venita Lick Marybeth Dandy,MSN,RN [phone]

## 2013-04-30 NOTE — Progress Notes (Signed)
ANTICOAGULATION CONSULT NOTE - Initial Consult  Pharmacy Consult for Lovenox Indication: DVT treatment  Allergies  Allergen Reactions  . Aspirin Shortness Of Breath and Swelling  . Erythromycin Hives and Rash  . Niacin And Related Other (See Comments)    Redness all over    Patient Measurements: Height: 5\' 2"  (157.5 cm) Weight: 97 lb 4.8 oz (44.135 kg) IBW/kg (Calculated) : 50.1  Vital Signs: Temp: 98.2 F (36.8 C) (02/12 0556) Temp src: Oral (02/12 0556) BP: 132/60 mmHg (02/12 0556) Pulse Rate: 101 (02/12 0556)  Labs:  Recent Labs  04/28/13 0400 04/29/13 0540 04/29/13 0656 04/30/13 0445  HGB 7.5*  --  8.2* 9.1*  HCT 23.0*  --  24.8* 28.0*  PLT 49*  --  51* 67*  CREATININE 0.45* 0.39*  --  0.36*   Estimated Creatinine Clearance: 41 ml/min (by C-G formula based on Cr of 0.36).  Assessment: 16 yoF undergoing chemotherapy/radiation for squamous cell carcinoma of anal region admitted 2/5 with severe malnutrition and FTT, also being treated for UTI and perirectal abscess.   Pt with anemia and thrombocytopenia.  Today found to have RUE deep and superficial vein thrombosis involving R subclavian, brachial and basilic veins.  Pharmacy consulted to begin full dose lovenox for VTE treatment.  Lovenox 1mg /kg q12 CBC: Hgb slowly increasing since admission, today 9.1, plts 67 (slowly increasing) [place] INR 1.66, Renal: SCr 0.36, Cl ~ 53N   Goal of Therapy:  Anti-Xa level 0.6-1 units/ml 4hrs after LMWH dose given Monitor platelets by anticoagulation protocol: Yes   Plan:   Continue Lovenox 40mg  q 12 hours  Monitor renal fx, CBC  Minda Ditto PharmD Pager [phone] 04/30/2013, 10:47 AM

## 2013-04-30 NOTE — Progress Notes (Signed)
CRITICAL VALUE ALERT  Critical value received: Calcium 6.4  Date of notification:  04/30/13  Time of notification:  0550  Critical value read back:yes  Nurse who received alert:  Azzie Glatter, RN  MD notified (1st page): Schorr, K.  Time of first page:  0550  MD notified (2nd page):n/a  Time of second page:n/a  Responding MD: Schorr  Time MD responded:  [phone] (orders received).

## 2013-05-01 ENCOUNTER — Telehealth: Payer: Self-pay | Admitting: Oncology

## 2013-05-01 ENCOUNTER — Other Ambulatory Visit: Payer: Self-pay | Admitting: *Deleted

## 2013-05-01 DIAGNOSIS — C210 Malignant neoplasm of anus, unspecified: Secondary | ICD-10-CM

## 2013-05-01 LAB — CBC
HEMATOCRIT: 28.4 % — AB (ref 36.0–46.0)
HEMOGLOBIN: 9.1 g/dL — AB (ref 12.0–15.0)
MCH: 29.4 pg (ref 26.0–34.0)
MCHC: 32 g/dL (ref 30.0–36.0)
MCV: 91.6 fL (ref 78.0–100.0)
Platelets: 78 10*3/uL — ABNORMAL LOW (ref 150–400)
RBC: 3.1 MIL/uL — ABNORMAL LOW (ref 3.87–5.11)
RDW: 16 % — ABNORMAL HIGH (ref 11.5–15.5)
WBC: 2.8 10*3/uL — ABNORMAL LOW (ref 4.0–10.5)

## 2013-05-01 LAB — BASIC METABOLIC PANEL
BUN: 3 mg/dL — ABNORMAL LOW (ref 6–23)
CHLORIDE: 99 meq/L (ref 96–112)
CO2: 27 mEq/L (ref 19–32)
Calcium: 7.1 mg/dL — ABNORMAL LOW (ref 8.4–10.5)
Creatinine, Ser: 0.34 mg/dL — ABNORMAL LOW (ref 0.50–1.10)
GFR calc Af Amer: >90 mL/min (ref >90)
GFR calc non Af Amer: >90 mL/min (ref >90)
Glucose, Bld: 91 mg/dL (ref 70–99)
Potassium: 3.7 mEq/L (ref 3.7–5.3)
SODIUM: 136 meq/L — AB (ref 137–147)

## 2013-05-01 LAB — GLUCOSE, CAPILLARY: Glucose-Capillary: 84 mg/dL (ref 70–99)

## 2013-05-01 LAB — MAGNESIUM: MAGNESIUM: 1.8 mg/dL (ref 1.5–2.5)

## 2013-05-01 MED ORDER — ENOXAPARIN SODIUM 30 MG/0.3ML ~~LOC~~ SOLN
30.0000 mg | Freq: Once | SUBCUTANEOUS | Status: AC
  Administered 2013-05-01: 30 mg via SUBCUTANEOUS
  Filled 2013-05-01: qty 0.3

## 2013-05-01 MED ORDER — ENOXAPARIN SODIUM 40 MG/0.4ML ~~LOC~~ SOLN
40.0000 mg | Freq: Once | SUBCUTANEOUS | Status: DC
  Filled 2013-05-01: qty 0.4

## 2013-05-01 MED ORDER — ENOXAPARIN SODIUM 80 MG/0.8ML ~~LOC~~ SOLN: 1.5000 mg/kg | Freq: Two times a day (BID) | SUBCUTANEOUS | Status: DC

## 2013-05-01 MED ORDER — CALCIUM CARBONATE 1250 (500 CA) MG PO TABS: 1.0000 | ORAL_TABLET | Freq: Two times a day (BID) | ORAL | Status: DC

## 2013-05-01 MED ORDER — ENOXAPARIN SODIUM 80 MG/0.8ML ~~LOC~~ SOLN: 1.5000 mg/kg | SUBCUTANEOUS | Status: DC

## 2013-05-01 MED ORDER — ENSURE COMPLETE PO LIQD: 237.0000 mL | Freq: Three times a day (TID) | ORAL | Status: DC

## 2013-05-01 MED ORDER — DRONABINOL 5 MG PO CAPS: 5.0000 mg | ORAL_CAPSULE | Freq: Two times a day (BID) | ORAL | Status: DC

## 2013-05-01 NOTE — Discharge Summary (Signed)
Physician Discharge Summary  Sara Cochran ERX:[id number] DOB: 12/17/1933 DOA: 04/23/2013  PCP: Alonza Bogus, MD  Admit date: 04/23/2013 Discharge date: 05/01/2013  Recommendations for Outpatient Follow-up:  1. Recommend BMET in 1 week to ensure stability of electrolytes.  Discharge Diagnoses:  Principal Problem:    Sepsis with dehydration secondary to colitis and diarrhea versus UTI Active Problems:    Anal cancer    Dehydration    Neutropenia    Anemia of chronic disease    Thrombocytopenia, unspecified    HTN (hypertension)    Anxiety state, unspecified    Severe protein-calorie malnutrition    DVT (deep venous thrombosis)    Hypomagnesemia    Hypokalemia    Diarrhea    Hypocalcemia    Physical deconditioning    Colitis    UTI (urinary tract infection)   Discharge Condition: Improved.  Diet recommendation: Regular.  History of present illness:  Sara Cochran is an 80 y.o. female with a PMH of squamous cell carcinoma of the anal region, status post radiation treatment as well as chemotherapy, last chemotherapy 03/23/13 with mitomycin and fluorouracil, who was admitted directly from the [facility] on 04/23/13 with dehydration, malnutrition and failure to thrive. She was also found to have a Klebsiella and Citrobacter urinary tract infection as well as a right arm DVT.  Hospital Course by problem:  Principal Problem:  Sepsis with dehydration secondary to colitis/diarrhea versus UTI  Patient was hypothermic on admission. CT scan of the abdomen showed resolving perirectal fluid collection/abscess but persistent distal colitis Stool studies negative for C. difficile. Treated with supportive care with IV fluids and nutritional support. The patient has completed 6 days of IV antibiotics (initially vancomycin/Zosyn followed by Primaxin), followed by oral Levaquin. No need for further antibiotics. Persistent but improved diarrhea likely related to radiation  enteritis. Antidiarrheals as needed.  Active Problems:  Hypocalcemia  Ionized calcium confirms low. Started calcium supplementation 04/29/13, will give a dose of calcium gluconate 04/30/13. Calcium levels normal at discharge.  Hypokalemia / Hypomagnesemia.  Potassium and magnesium normal at discharge after supplementation.  Weakness/failure to thrive/physical deconditioning  Physical therapy recommending home health physical therapy at discharge.  Anal cancer  Being followed by Dr. Benay Spice of oncology. Completed radiation therapy 04/06/13 and had second cycle of chemotherapy 04/23/13. Repeat restaging CT scan of the abdomen did not show evidence of disease progression. Will followup with him in 2 weeks. Pancytopenia (anemia, thrombocytopenia) with Neutropenia  Pancytopenia related to the sequela of prior chemotherapy. Absolute neutrophil count 1.6 on admission. No current indication for Neupogen or blood/platelet transfusion. Received one unit of packed red blood cells 04/28/13. Counts have slowly improved over the past several days.  HTN (hypertension)  Norvasc and Avapro remain on hold.  Anxiety state, unspecified  Continue Xanax as needed.  Severe protein-calorie malnutrition / underweight  Seen by dietitian 04/24/13. Continue supplements. Discharged home on Marinol for appetite stimulation.  DVT (deep venous thrombosis) of the right upper extremity  Continue therapeutic dose Lovenox for 3 months. Stop Estrace.  Procedures:  04/27/13 Right upper extremity venous duplex:  Summary: - Findings consistent with acute deep vein thrombosis involving the right subclavian vein and hyperacute platelet aggregation involving theright brachial vein.  Superficial vein thrombosis involving the right basilic  Consultations:  Dr. Julieanne Manson, Oncology.  Discharge Exam: Filed Vitals:   05/01/13 1400  BP: 125/56  Pulse: 109  Temp: 98.2 F (36.8 C)  Resp: 18   Filed Vitals:   04/30/13  2137 04/30/13  2305 05/01/13 0556 05/01/13 1400  BP:  112/57 121/59 125/56  Pulse:  105 99 109  Temp:  98.1 F (36.7 C) 98 F (36.7 C) 98.2 F (36.8 C)  TempSrc:  Oral Oral Oral  Resp:  16 20 18   Height:      Weight:   43.273 kg (95 lb 6.4 oz)   SpO2: 95% 93% 95% 95%    Gen:  NAD Cardiovascular:  RRR, No M/R/G Respiratory: Lungs CTAB Gastrointestinal: Abdomen soft, NT/ND with normal active bowel sounds. Extremities: UE swelling.   Discharge Instructions      Discharge Orders   Future Appointments Provider Department Dept Phone   05/07/2013 11:50 AM Marye Round, MD [facility] Radiation Oncology [phone]   Future Orders Complete By Expires   Call MD for:  persistant nausea and vomiting  As directed    Call MD for:  As directed    Scheduling Instructions:     Worsening diarrhea or dehydration.   Diet general  As directed    Discharge instructions  As directed    Comments:     Estrace was discontinued because of the risk of recurrent blood clots with the use of this medication.  Make sure you stay well hydrated.  Broths, teas, and Gatorade are good choices.  You were cared for by Dr. Jacquelynn Cree  (a hospitalist) during your hospital stay. If you have any questions about your discharge medications or the care you received while you were in the hospital after you are discharged, you can call the unit and ask to speak with the hospitalist on call if the hospitalist that took care of you is not available. Once you are discharged, your primary care physician will handle any further medical issues. Please note that NO REFILLS for any discharge medications will be authorized once you are discharged, as it is imperative that you return to your primary care physician (or establish a relationship with a primary care physician if you do not have one) for your aftercare needs so that they can reassess your need for medications and monitor your lab values.  Any outstanding tests can  be reviewed by your PCP at your follow up visit.  It is also important to review any medicine changes with your PCP.  Please bring these d/c instructions with you to your next visit so your physician can review these changes with you.  If you do not have a primary care physician, you can call [phone] for a physician referral.  It is highly recommended that you obtain a PCP for hospital follow up.   DME Bedside commode  As directed    Face-to-face encounter (required for Medicare/Medicaid patients)  As directed    Comments:     I Harjit Douds certify that this patient is under my care and that I, or a nurse practitioner or physician's assistant working with me, had a face-to-face encounter that meets the physician face-to-face encounter requirements with this patient on 05/01/2013. The encounter with the patient was in whole, or in part for the following medical condition(s) which is the primary reason for home health care (List medical condition): Needs skilled RN to assess her condition given her frailty and high risk for rehospitalization. Has new DVT and has had problems with persistent diarrhea from radiation versus infectious colitis. High-risk for dehydration. Is frail and cachectic and will need physical therapy at home.   Questions:     The encounter with the  patient was in whole, or in part, for the following medical condition, which is the primary reason for home health care:  DVT, dehydration, radiation versus infectious colitis, cachexia   I certify that, based on my findings, the following services are medically necessary home health services:  Nursing   Physical therapy   My clinical findings support the need for the above services:  Unable to leave home safely without assistance and/or assistive device   Further, I certify that my clinical findings support that this patient is homebound due to:  Unable to leave home safely without assistance   Reason for Medically Necessary Home Health  Services:  Skilled Nursing- Skilled Assessment/Observation   Skilled Nursing- Changes in Medication/Medication Management   Skilled Nursing- Teaching of Disease Process/Symptom Management   Therapy- Therapeutic Exercises to Increase Strength and Endurance   Therapy- Home Adaptation to [place]  As directed    Questions:     To provide the following care/treatments:  PT   RN   Increase activity slowly  As directed    Walk with assistance  As directed    Walker   As directed        Medication List    STOP taking these medications       amLODipine 5 MG tablet  Commonly known as:  NORVASC     estradiol 2 MG tablet  Commonly known as:  ESTRACE     olmesartan 40 MG tablet  Commonly known as:  BENICAR      TAKE these medications       albuterol 108 (90 BASE) MCG/ACT inhaler  Commonly known as:  PROVENTIL HFA;VENTOLIN HFA  Inhale 2 puffs into the lungs every 6 (six) hours as needed for wheezing or shortness of breath.     ALPRAZolam 1 MG tablet  Commonly known as:  XANAX  Take 1 mg by mouth at bedtime as needed for sleep.     calcium carbonate 1250 MG tablet  Commonly known as:  OS-CAL - dosed in mg of elemental calcium  Take 1 tablet (500 mg of elemental calcium total) by mouth 2 (two) times daily with a meal.     diphenoxylate-atropine 2.5-0.025 MG per tablet  Commonly known as:  LOMOTIL  Take 2 tablets by mouth 4 (four) times daily as needed for diarrhea or loose stools.     dronabinol 5 MG capsule  Commonly known as:  MARINOL  Take 1 capsule (5 mg total) by mouth 2 (two) times daily before lunch and supper.     enoxaparin 80 MG/0.8ML injection  Commonly known as:  LOVENOX  Inject 0.65 mLs (65 mg total) into the skin daily.  Start taking on:  05/02/2013     feeding supplement (ENSURE COMPLETE) Liqd  Take 237 mLs by mouth 3 (three) times daily with meals.     ipratropium-albuterol 0.5-2.5 (3) MG/3ML Soln  Commonly known as:  DUONEB  Take 3  mLs by nebulization every 6 (six) hours as needed (shortness of breath).     loperamide 2 MG capsule  Commonly known as:  IMODIUM  Take 2 mg by mouth as needed for diarrhea or loose stools.     LORCET 10/650 10-650 MG per tablet  Generic drug:  HYDROcodone-acetaminophen  Take 1 tablet by mouth every 6 (six) hours as needed for pain.     omeprazole 20 MG capsule  Commonly known as:  PRILOSEC  Take 20 mg by mouth daily.  potassium chloride SA 20 MEQ tablet  Commonly known as:  K-DUR,KLOR-CON  Take 1 tablet (20 mEq total) by mouth daily.     pravastatin 40 MG tablet  Commonly known as:  PRAVACHOL  Take 40 mg by mouth daily.     prochlorperazine 5 MG tablet  Commonly known as:  COMPAZINE  Take 1 tablet (5 mg total) by mouth every 6 (six) hours as needed for nausea or vomiting.       Follow-up Information   Follow up with HAWKINS,EDWARD L, MD. Schedule an appointment as soon as possible for a visit in 1 week. (For follow up lab work in 1 week to ensure electrolytes remain normal.)    Specialty:  Pulmonary Disease   Contact information:   [address] [place] [address] [phone]       Follow up with Betsy Coder, MD. (Office will call with an appt in 2 weeks.)    Specialty:  Oncology   Contact information:   [address] [place] [address] [phone]        The results of significant diagnostics from this hospitalization (including imaging, microbiology, ancillary and laboratory) are listed below for reference.    Significant Diagnostic Studies: Ct Abdomen Pelvis W Contrast  04/27/2013   CLINICAL DATA:  H/O anal cancer, f/u perictal fluid collection, ?abscess  EXAM: CT ABDOMEN AND PELVIS WITH CONTRAST  TECHNIQUE: Multidetector CT imaging of the abdomen and pelvis was performed using the standard protocol following bolus administration of intravenous contrast.  CONTRAST:  34mL OMNIPAQUE IOHEXOL 300 MG/ML  SOLN  COMPARISON:  None.   FINDINGS: Small bilateral pleural effusions are appreciated. Centrilobular emphysematous changes are appreciated as well as thickening of the interstitial markings. A very vague ground-glass background is appreciated within the lung bases. A moderate to large hiatal hernia is appreciated.  The very small low attenuating focus within the anterior dome of the right lobe of the liver described on previous study is not clearly appreciated on the present study. A stable small ill-defined low attenuating focus projects adjacent to the falciform ligament on the right. Best seen image 19 series 2. Statistically this likely reflects an area of focal fatty infiltration. Surveillance evaluation of this finding on subsequent imaging is recommended. A stable small calcified granuloma projects in the posterior aspect the right lobe liver image 16 series 2. The liver otherwise appears to be unremarkable.  The spleen, right adrenal, pancreas are unremarkable. The right kidney is unremarkable. An extrarenal pelvis identified within the left kidney which is otherwise unremarkable. The left adrenal demonstrates a nodular appearance still maintains an adreniform shape. A more focal nodular component is identified within the posterior medial limb of the adrenal image 16 series 2 demonstrating Hounsfield units of 58. This finding is indeterminate differential considerations are a small adrenal adenoma.  Atherosclerotic calcifications identified within the aorta. There is no evidence of abdominal aortic aneurysm. The celiac, SMA, IMA, portal vein, SMV are opacified.  There is no evidence of bowel obstruction. There are multiple areas of bowel wall thickening within scattered loops of small bowel. No associated drainable loculated fluid collections are appreciated. There is a small amount of free fluid within the pelvis. The loculated collection adjacent to the anus has decreased in size when compared to the previous study with a decreased  air component. The poorly defined residual of measures approximately 1.6 x 1.2 cm in AP by transverse dimensions. There is residual bowel wall thickening within the  distal sigmoid and rectal anal portions of the colon.  There is no evidence of abdominal or pelvic adenopathy nor masses. Subcentimeter lymph nodes are appreciated within the porta hepatis region and retroperitoneal regions.  There is no evidence of abdominal wall nor inguinal hernia. There is no evidence of aggressive appearing osseous lesions.  Subcutaneous fluid is appreciated within the soft tissues of the lower back. There is no evidence of a loculated component. There is also evidence of infiltration within the subcutaneous fat of the abdominal wall and pelvic wall regions.  IMPRESSION: 1. Resolving perirectal fluid collection/abscess. Persistent distal colitis is appreciated. There also findings which reflect either reactive edema within loops of small bowel versus a concomitant enteritis. Continued surveillance evaluation recommended. 2. Indeterminate small nodule within the left adrenal this finding may be further characterized with adrenal protocol MRI. 3. Likely area of focal fatty infiltration adjacent to the falciform ligament 4. Small to moderate hiatal hernia. 5. Small bilateral effusions as well as emphysematous and interstitial changes within the lung bases 6. Fluid within the subcutaneous fat of the lower lumbar and upper pelvic regions posteriorly. Slightly may reflect dependent edema particularly if the patient is chronically supine. Areas of infiltration within the subcutaneous fat which may reflect developing anasarca clinically appropriate.   Electronically Signed   By: Margaree Mackintosh M.D.   On: 04/27/2013 16:12   Portable Chest 1 View  04/23/2013   CLINICAL DATA:  80 year old female cough and congestion. Initial encounter.  EXAM: PORTABLE CHEST - 1 VIEW  COMPARISON:  05/03/2008.  FINDINGS: Portable AP upright view at 1745 hrs.  Chronic large lung volumes with attenuated bronchovascular markings in keeping with emphysema. Normal cardiac size and mediastinal contours. Visualized tracheal air column is within normal limits. No pneumothorax, pulmonary edema, pleural effusion or confluent pulmonary opacity.  IMPRESSION: Chronic hyperinflation. No acute cardiopulmonary abnormality identified.   Electronically Signed   By: Lars Pinks M.D.   On: 04/23/2013 18:18    Labs:  Basic Metabolic Panel:  Recent Labs Lab 04/27/13 0330 04/28/13 0400  04/29/13 0540 04/30/13 0445 05/01/13 0540  NA 134* 135*  --  136* 137 136*  K 3.7 2.5*  < > 3.1* 3.1* 3.7  CL 98 96  --  99 100 99  CO2 25 27  --  24 26 27   GLUCOSE 86 79  --  89 91 91  BUN 7 4*  --  3* 3* 3*  CREATININE 0.44* 0.45*  --  0.39* 0.36* 0.34*  CALCIUM 6.7* 6.7*  --  6.4* 6.4* 7.1*  MG  --   --   --   --  1.0* 1.8  < > = values in this interval not displayed. GFR Estimated Creatinine Clearance: 40.3 ml/min (by C-G formula based on Cr of 0.34). Liver Function Tests:  Recent Labs Lab 04/29/13 0820 04/30/13 0533  ALBUMIN 1.6* 1.4*   CBC:  Recent Labs Lab 04/27/13 0330 04/28/13 0400 04/29/13 0656 04/30/13 0445 05/01/13 0540  WBC 1.5* 1.8* 2.4* 2.6* 2.8*  HGB 7.6* 7.5* 8.2* 9.1* 9.1*  HCT 23.4* 23.0* 24.8* 28.0* 28.4*  MCV 92.1 91.3 89.2 90.9 91.6  PLT 56* 49* 51* 67* 78*   CBG:  Recent Labs Lab 04/27/13 0744 04/28/13 0800 04/29/13 0809 04/30/13 0735 05/01/13 0726  GLUCAP 97 73 87 91 84   Microbiology Recent Results (from the past 240 hour(s))  URINE CULTURE     Status: None   Collection Time    04/24/13 12:09 AM  Result Value Ref Range Status   Specimen Description URINE, CLEAN CATCH   Final   Special Requests NONE   Final   Culture  Setup Time     Final   Value: 04/24/2013 03:55     Performed at [facility]     Final   Value: 70,000 COLONIES/ML     Performed at [facility]   Culture     Final    Value: KLEBSIELLA PNEUMONIAE     Note: Confirmed Extended Spectrum Beta-Lactamase Producer (ESBL) CRITICAL RESULT CALLED TO, READ BACK BY AND VERIFIED WITH: DEKINA C. AT 12:55PM ON 04/27/2013 BY HAJAM     CITROBACTER FREUNDII     Performed at [facility]   Report Status 04/27/2013 FINAL   Final   Organism ID, Bacteria KLEBSIELLA PNEUMONIAE   Final   Organism ID, Bacteria CITROBACTER FREUNDII   Final  CULTURE, BLOOD (ROUTINE X 2)     Status: None   Collection Time    04/24/13 11:35 AM      Result Value Ref Range Status   Specimen Description BLOOD RIGHT WRIST   Final   Special Requests BOTTLES DRAWN AEROBIC AND ANAEROBIC 5CC   Final   Culture  Setup Time     Final   Value: 04/24/2013 14:10     Performed at [facility]   Culture     Final   Value: NO GROWTH 5 DAYS     Performed at [facility]   Report Status 04/30/2013 FINAL   Final  CULTURE, BLOOD (ROUTINE X 2)     Status: None   Collection Time    04/24/13 11:40 AM      Result Value Ref Range Status   Specimen Description BLOOD LEFT WRIST   Final   Special Requests BOTTLES DRAWN AEROBIC AND ANAEROBIC 3CC   Final   Culture  Setup Time     Final   Value: 04/24/2013 14:11     Performed at [facility]   Culture     Final   Value: NO GROWTH 5 DAYS     Performed at [facility]   Report Status 04/30/2013 FINAL   Final    Time coordinating discharge: 35 minutes.  Signed:  Jarl Sellitto  Pager [phone] [facility] 05/01/2013, 2:11 PM

## 2013-05-01 NOTE — Discharge Instructions (Signed)
Colitis Colitis is inflammation of the colon. Colitis can be a short-term or long-standing (chronic) illness. Crohn's disease and ulcerative colitis are 2 types of colitis which are chronic. They usually require lifelong treatment. CAUSES  There are many different causes of colitis, including:  Viruses.  Germs (bacteria).  Medicine reactions. SYMPTOMS   Diarrhea.  Intestinal bleeding.  Pain.  Fever.  Throwing up (vomiting).  Tiredness (fatigue).  Weight loss.  Bowel blockage. DIAGNOSIS  The diagnosis of colitis is based on examination and stool or blood tests. X-rays, CT scan, and colonoscopy may also be needed. TREATMENT  Treatment may include:  Fluids given through the vein (intravenously).  Bowel rest (nothing to eat or drink for a period of time).  Medicine for pain and diarrhea.  Medicines (antibiotics) that kill germs.  Cortisone medicines.  Surgery. HOME CARE INSTRUCTIONS   Get plenty of rest.  Drink enough water and fluids to keep your urine clear or pale yellow.  Eat a well-balanced diet.  Call your caregiver for follow-up as recommended. SEEK IMMEDIATE MEDICAL CARE IF:   You develop chills.  You have an oral temperature above 102 F (38.9 C), not controlled by medicine.  You have extreme weakness, fainting, or dehydration.  You have repeated vomiting.  You develop severe belly (abdominal) pain or are passing bloody or tarry stools. MAKE SURE YOU:   Understand these instructions.  Will watch your condition.  Will get help right away if you are not doing well or get worse. Document Released: 04/12/2004 Document Revised: 05/28/2011 Document Reviewed: 07/08/2009 [facility] Patient Information 2014 [place], [place].  Deep Vein Thrombosis A deep vein thrombosis (DVT) is a blood clot that develops in the deep, larger veins of the leg, arm, or pelvis. These are more dangerous than clots that might form in veins near the surface of the body. A  DVT can lead to complications if the clot breaks off and travels in the bloodstream to the lungs.  A DVT can damage the valves in your leg veins, so that instead of flowing upward, the blood pools in the lower leg. This is called post-thrombotic syndrome, and it can result in pain, swelling, discoloration, and sores on the leg. CAUSES Usually, several things contribute to blood clots forming. Contributing factors include:  The flow of blood slows down.  The inside of the vein is damaged in some way.  You have a condition that makes blood clot more easily. RISK FACTORS Some people are more likely than others to develop blood clots. Risk factors include:   Older age, especially over 37 years of age.  Having a family history of blood clots or if you have already had a blot clot.  Having major or lengthy surgery. This is especially true for surgery on the hip, knee, or belly (abdomen). Hip surgery is particularly high risk.  Breaking a hip or leg.  Sitting or lying still for a long time. This includes long-distance travel, paralysis, or recovery from an illness or surgery.  Having cancer or cancer treatment.  Having a long, thin tube (catheter) placed inside a vein during a medical procedure.  Being overweight (obese).  Pregnancy and childbirth.  Hormone changes make the blood clot more easily during pregnancy.  The fetus puts pressure on the veins of the pelvis.  There is a risk of injury to veins during delivery or a caesarean. The risk is highest just after childbirth.  Medicines with the female hormone estrogen. This includes birth control pills and hormone  replacement therapy.  Smoking.  Other circulation or heart problems.  SIGNS AND SYMPTOMS When a clot forms, it can either partially or totally block the blood flow in that vein. Symptoms of a DVT can include:  Swelling of the leg or arm, especially if one side is much worse.  Warmth and redness of the leg or arm,  especially if one side is much worse.  Pain in an arm or leg. If the clot is in the leg, symptoms may be more noticeable or worse when standing or walking. The symptoms of a DVT that has traveled to the lungs (pulmonary embolism, PE) usually start suddenly and include:  Shortness of breath.  Coughing.  Coughing up blood or blood-tinged phlegm.  Chest pain. The chest pain is often worse with deep breaths.  Rapid heartbeat. Anyone with these symptoms should get emergency medical treatment right away. Call your local emergency services (911 in the [place] if you have these symptoms. DIAGNOSIS If a DVT is suspected, your health care provider will take a full medical history and perform a physical exam. Tests that also may be required include:  Blood tests, including studies of the clotting properties of the blood.  Ultrasonography to see if you have clots in your legs or lungs.  X-rays to show the flow of blood when dye is injected into the veins (venography).  Studies of your lungs if you have any chest symptoms. PREVENTION  Exercise the legs regularly. Take a brisk 30-minute walk every day.  Maintain a weight that is appropriate for your height.  Avoid sitting or lying in bed for long periods of time without moving your legs.  Women, particularly those over the age of 52 years, should consider the risks and benefits of taking estrogen medicines, including birth control pills.  Do not smoke, especially if you take estrogen medicines.  Long-distance travel can increase your risk of DVT. You should exercise your legs by walking or pumping the muscles every hour.  In-hospital prevention:  Many of the risk factors above relate to situations that exist with hospitalization, either for illness, injury, or elective surgery.  Your health care provider will assess you for the need for venous thromboembolism prophylaxis when you are admitted to the hospital. If you are having surgery,  your surgeon will assess you the day of or day after surgery.  Prevention may include medical and nonmedical measures. TREATMENT Once identified, a DVT can be treated. It can also be prevented in some circumstances. Once you have had a DVT, you may be at increased risk for a DVT in the future. The most common treatment for DVT is blood thinning (anticoagulant) medicine, which reduces the blood's tendency to clot. Anticoagulants can stop new blood clots from forming and stop old ones from growing. They cannot dissolve existing clots. Your body does this by itself over time. Anticoagulants can be given by mouth, by IV access, or by injection. Your health care provider will determine the best program for you. Other medicines or treatments that may be used are:  Heparin or related medicines (low molecular weight heparin) are usually the first treatment for a blood clot. They act quickly. However, they cannot be taken orally.  Heparin can cause a fall in a component of blood that stops bleeding and forms blood clots (platelets). You will be monitored with blood tests to be sure this does not occur.  Warfarin is an anticoagulant that can be swallowed. It takes a few days to start working,  so usually heparin or related medicines are used in combination. Once warfarin is working, heparin is usually stopped.  Less commonly, clot dissolving drugs (thrombolytics) are used to dissolve a DVT. They carry a high risk of bleeding, so they are used mainly in severe cases, where your life or a limb is threatened.  Very rarely, a blood clot in the leg needs to be removed surgically.  If you are unable to take anticoagulants, your health care provider may arrange for you to have a filter placed in a main vein in your abdomen. This filter prevents clots from traveling to your lungs. HOME CARE INSTRUCTIONS  Take all medicines prescribed by your health care provider. Only take over-the-counter or prescription medicines  for pain, fever, or discomfort as directed by your health care provider.  Warfarin. Most people will continue taking warfarin after hospital discharge. Your health care provider will advise you on the length of treatment (usually 3 6 months, sometimes lifelong).  Too much and too little warfarin are both dangerous. Too much warfarin increases the risk of bleeding. Too little warfarin continues to allow the risk for blood clots. While taking warfarin, you will need to have regular blood tests to measure your blood clotting time. These blood tests usually include both the prothrombin time (PT) and international normalized ratio (INR) tests. The PT and INR results allow your health care provider to adjust your dose of warfarin. The dose can change for many reasons. It is critically important that you take warfarin exactly as prescribed, and that you have your PT and INR levels drawn exactly as directed.  Many foods, especially foods high in vitamin K, can interfere with warfarin and affect the PT and INR results. Foods high in vitamin K include spinach, kale, broccoli, cabbage, collard and turnip greens, brussel sprouts, peas, cauliflower, seaweed, and parsley as well as beef and pork liver, green tea, and soybean oil. You should eat a consistent amount of foods high in vitamin K. Avoid major changes in your diet, or notify your health care provider before changing your diet. Arrange a visit with a dietitian to answer your questions.  Many medicines can interfere with warfarin and affect the PT and INR results. You must tell your health care provider about any and all medicines you take. This includes all vitamins and supplements. Be especially cautious with aspirin and anti-inflammatory medicines. Ask your health care provider before taking these. Do not take or discontinue any prescribed or over-the-counter medicine except on the advice of your health care provider or pharmacist.  Warfarin can have side  effects, primarily excessive bruising or bleeding. You will need to hold pressure over cuts for longer than usual. Your health care provider or pharmacist will discuss other potential side effects.  Alcohol can change the body's ability to handle warfarin. It is best to avoid alcoholic drinks or consume only very small amounts while taking warfarin. Notify your health care provider if you change your alcohol intake.  Notify your dentist or other health care providers before procedures.  Activity. Ask your health care provider how soon you can go back to normal activities. It is important to stay active to prevent blood clots. If you are on anticoagulant medicine, avoid contact sports.  Exercise. It is very important to exercise. This is especially important while traveling, sitting, or standing for long periods of time. Exercise your legs by walking or by pumping the muscles frequently. Take frequent walks.  Compression stockings. These are tight elastic stockings  that apply pressure to the lower legs. This pressure can help keep the blood in the legs from clotting. You may need to wear compression stockings at home to help prevent a DVT.  Do not smoke. If you smoke, quit. Ask your health care provider for help with quitting smoking.  Learn as much as you can about DVT. Knowing more about the condition should help you keep it from coming back.  Wear a medical alert bracelet or carry a medical alert card. SEEK MEDICAL CARE IF:  You notice a rapid heartbeat.  You feel weaker or more tired than usual.  You feel faint.  You notice increased bruising.  You feel your symptoms are not getting better in the time expected.  You believe you are having side effects of medicine. SEEK IMMEDIATE MEDICAL CARE IF:  You have chest pain.  You have trouble breathing.  You have new or increased swelling or pain in one leg.  You cough up blood.  You notice blood in vomit, in a bowel movement, or  in urine. MAKE SURE YOU:  Understand these instructions.  Will watch your condition.  Will get help right away if you are not doing well or get worse. Document Released: 03/05/2005 Document Revised: 12/24/2012 Document Reviewed: 11/10/2012 [facility] Patient Information 2014 [place].  Dehydration, Elderly Dehydration is when you lose more fluids from the body than you take in. Vital organs such as the kidneys, brain, and heart cannot function without a proper amount of fluids and salt. Any loss of fluids from the body can cause dehydration.  Older adults are at a higher risk of dehydration than younger adults. As we age, our bodies are less able to conserve water and do not respond to temperature changes as well. Also, older adults do not become thirsty as easily or quickly. Because of this, older adults often do not realize they need to increase fluids to avoid dehydration.  CAUSES   Vomiting.  Diarrhea.  Excessive sweating.  Excessive urination.  Fever.  Certain medicines, such as blood pressure medicines called diuretics.  Poorly controlled blood sugars. SIGNS AND SYMPTOMS  Mild dehydration:  Thirst.  Dry lips.  Slightly dry mouth. Moderate dehydration:  Very dry mouth.  Sunken eyes.  Skin does not bounce back quickly when lightly pinched and released.  Dark urine and decreased urine production.  Decreased tear production.  Headache. Severe dehydration:  Very dry mouth.  Extreme thirst.  Rapid, weak pulse (more than 100 beats per minute at rest).  Cold hands and feet.  Not able to sweat in spite of heat.  Rapid breathing.  Blue lips.  Confusion and lethargy.  Difficulty being awakened.  Minimal urine production.  No tears. DIAGNOSIS  Your health care provider will diagnose dehydration based on your symptoms and your exam. Blood and urine tests will help confirm the diagnosis. The diagnostic evaluation should also identify the cause of  dehydration. TREATMENT  Treatment of mild or moderate dehydration can often be done at home by increasing the amount of fluids that you drink. It is best to drink small amounts of fluid more often. Drinking too much at one time can make vomiting worse. Severe dehydration needs to be treated at the hospital. You may be given IV fluids that contain water and electrolytes. HOME CARE INSTRUCTIONS   Ask your health care provider about specific rehydration instructions.  Drink enough fluids to keep your urine clear or pale yellow.  Drink small amounts frequently if you have  nausea and vomiting.  Eat as you normally do.  Avoid:  Foods or drinks high in sugar.  Carbonated drinks.  Juice.  Extremely hot or cold fluids.  Drinks with caffeine.  Fatty, greasy foods.  Alcohol.  Tobacco.  Overeating.  Gelatin desserts.  Wash your hands well to avoid spreading bacteria and viruses.  Only take over-the-counter or prescription medicines for pain, discomfort, or fever as directed by your health care provider.  Ask your health care provider if you should continue all prescribed and over-the-counter medicines.  Keep all follow-up appointments with your health care provider. SEEK MEDICAL CARE IF:  You have abdominal pain, and it increases or stays in one area (localizes).  You have a rash, stiff neck, or severe headache.  You are irritable, sleepy, or difficult to awaken.  You are weak, dizzy, or extremely thirsty. SEEK IMMEDIATE MEDICAL CARE IF:   You are unable to keep fluids down, or you get worse despite treatment.  You have frequent episodes of vomiting or diarrhea.  You have blood or green matter (bile) in your vomit.  You have blood in your stool, or your stool looks black and tarry.  You have not urinated in 6 8 hours, or you have only urinated a small amount of very dark urine.  You have a fever.  You faint. MAKE SURE YOU:   Understand these  instructions.  Will watch your condition.  Will get help right away if you are not doing well or get worse. Document Released: 05/26/2003 Document Revised: 12/24/2012 Document Reviewed: 11/10/2012 [facility] Patient Information 2014 [place].

## 2013-05-01 NOTE — Progress Notes (Signed)
Patient received discharge instructions with daughter at bedside. Both verbalized understanding. Patient belongings packed, patient follow up appointments discussed, medications discussed, and prescriptions given. Patient to be taken downstairs via wheelchair to be discharged home.

## 2013-05-01 NOTE — Care Management Note (Signed)
MD orders for RN/PT, RW, & 3N1. Pt states having Rw at home. [facility] liasion [place] and [place] made aware of pt's discharge orders and dme needs. Dme delivery scheduled to bedside. No other barriers identified. Pt's daughter to assist in home care.     Venita Lick Bowen Kia,MSN,RN [phone]

## 2013-05-01 NOTE — Telephone Encounter (Signed)
Gave pt appt for February 2015 lab and ML

## 2013-05-03 DIAGNOSIS — D638 Anemia in other chronic diseases classified elsewhere: Secondary | ICD-10-CM | POA: Diagnosis not present

## 2013-05-03 DIAGNOSIS — E43 Unspecified severe protein-calorie malnutrition: Secondary | ICD-10-CM | POA: Diagnosis not present

## 2013-05-03 DIAGNOSIS — Z87440 Personal history of urinary (tract) infections: Secondary | ICD-10-CM | POA: Diagnosis not present

## 2013-05-03 DIAGNOSIS — C210 Malignant neoplasm of anus, unspecified: Secondary | ICD-10-CM | POA: Diagnosis not present

## 2013-05-03 DIAGNOSIS — J449 Chronic obstructive pulmonary disease, unspecified: Secondary | ICD-10-CM | POA: Diagnosis not present

## 2013-05-03 DIAGNOSIS — I10 Essential (primary) hypertension: Secondary | ICD-10-CM | POA: Diagnosis not present

## 2013-05-03 DIAGNOSIS — Z5181 Encounter for therapeutic drug level monitoring: Secondary | ICD-10-CM | POA: Diagnosis not present

## 2013-05-03 DIAGNOSIS — L989 Disorder of the skin and subcutaneous tissue, unspecified: Secondary | ICD-10-CM | POA: Diagnosis not present

## 2013-05-03 DIAGNOSIS — I82609 Acute embolism and thrombosis of unspecified veins of unspecified upper extremity: Secondary | ICD-10-CM | POA: Diagnosis not present

## 2013-05-07 ENCOUNTER — Ambulatory Visit: Payer: Medicare Other | Admitting: Radiation Oncology

## 2013-05-07 DIAGNOSIS — I10 Essential (primary) hypertension: Secondary | ICD-10-CM | POA: Diagnosis not present

## 2013-05-07 DIAGNOSIS — Z79899 Other long term (current) drug therapy: Secondary | ICD-10-CM | POA: Diagnosis not present

## 2013-05-12 ENCOUNTER — Ambulatory Visit: Payer: Medicare Other | Admitting: Nurse Practitioner

## 2013-05-12 ENCOUNTER — Other Ambulatory Visit: Payer: Medicare Other

## 2013-05-15 ENCOUNTER — Telehealth: Payer: Self-pay | Admitting: Oncology

## 2013-05-15 NOTE — Telephone Encounter (Signed)
Talked to pt and gave her r/s appt from 2/26 due to weather appt now on 3/10 with ML

## 2013-05-18 ENCOUNTER — Encounter: Payer: Self-pay | Admitting: Radiation Oncology

## 2013-05-20 ENCOUNTER — Ambulatory Visit: Payer: Medicare Other | Admitting: Radiation Oncology

## 2013-05-21 ENCOUNTER — Ambulatory Visit: Admission: RE | Admit: 2013-05-21 | Payer: Medicare Other | Source: Ambulatory Visit | Admitting: Radiation Oncology

## 2013-05-21 HISTORY — DX: Personal history of irradiation: Z92.3

## 2013-05-25 ENCOUNTER — Other Ambulatory Visit: Payer: Self-pay | Admitting: Nurse Practitioner

## 2013-05-26 ENCOUNTER — Ambulatory Visit: Payer: Medicare Other | Admitting: Nurse Practitioner

## 2013-05-26 ENCOUNTER — Encounter: Payer: Self-pay | Admitting: *Deleted

## 2013-05-26 ENCOUNTER — Other Ambulatory Visit: Payer: Medicare Other

## 2013-05-26 ENCOUNTER — Telehealth: Payer: Self-pay | Admitting: Oncology

## 2013-05-26 ENCOUNTER — Telehealth: Payer: Self-pay | Admitting: *Deleted

## 2013-05-26 NOTE — Telephone Encounter (Signed)
PT. HAS A DOSE OF LOVENOX FOR TOMORROW. SHE WOULD LIKE A PILL WHICH IS LESS EXPENSIVE. THIS NOTE TO LISA THOMAS,NP.

## 2013-05-26 NOTE — Telephone Encounter (Signed)
s/w pt re new appt for 3/23 @ 10:15am lb/LT. 3/10 appt cx'd - pt aware. pt forwarded to desk nurse to lm re medicine.

## 2013-05-26 NOTE — Progress Notes (Signed)
PT. WAS GIVEN SAMPLES OF LOVENOX 80MG /0.8ML #12. LOT #[id number] EXPIRATION DATE 08/2015

## 2013-05-26 NOTE — Telephone Encounter (Signed)
VERBAL ORDER AND READ BACK TO LISA THOMAS,NP- CHECK WITH PHARMACY TO OBTAIN LOVENOX SAMPLES FOR PT. SHE WILL NEED ENOUGH UNTIL HER 06/08/13 APPOINTMENT WITH  DR. SHERRILL. SEE DOCUMENTATION NOTE. NOTIFIED PT. THAT LOVENOX SAMPLES WERE AVAILABLE. SHE WILL PICK UP MEDICATION TODAY.

## 2013-05-27 ENCOUNTER — Encounter: Payer: Self-pay | Admitting: *Deleted

## 2013-06-08 ENCOUNTER — Other Ambulatory Visit (HOSPITAL_BASED_OUTPATIENT_CLINIC_OR_DEPARTMENT_OTHER): Payer: Medicare Other

## 2013-06-08 ENCOUNTER — Ambulatory Visit (HOSPITAL_BASED_OUTPATIENT_CLINIC_OR_DEPARTMENT_OTHER): Payer: Medicare Other | Admitting: Nurse Practitioner

## 2013-06-08 ENCOUNTER — Telehealth: Payer: Self-pay | Admitting: Oncology

## 2013-06-08 VITALS — BP 172/60 | HR 102 | Temp 97.1°F | Resp 16 | Ht 62.0 in | Wt 90.8 lb

## 2013-06-08 DIAGNOSIS — I82409 Acute embolism and thrombosis of unspecified deep veins of unspecified lower extremity: Secondary | ICD-10-CM

## 2013-06-08 DIAGNOSIS — C210 Malignant neoplasm of anus, unspecified: Secondary | ICD-10-CM

## 2013-06-08 DIAGNOSIS — K612 Anorectal abscess: Secondary | ICD-10-CM | POA: Diagnosis not present

## 2013-06-08 DIAGNOSIS — J449 Chronic obstructive pulmonary disease, unspecified: Secondary | ICD-10-CM | POA: Diagnosis not present

## 2013-06-08 DIAGNOSIS — C20 Malignant neoplasm of rectum: Secondary | ICD-10-CM

## 2013-06-08 LAB — CBC WITH DIFFERENTIAL/PLATELET
BASO%: 0.5 % (ref 0.0–2.0)
Basophils Absolute: 0 10*3/uL (ref 0.0–0.1)
EOS ABS: 0.1 10*3/uL (ref 0.0–0.5)
EOS%: 1.8 % (ref 0.0–7.0)
HEMATOCRIT: 31.3 % — AB (ref 34.8–46.6)
HGB: 10.1 g/dL — ABNORMAL LOW (ref 11.6–15.9)
LYMPH#: 0.7 10*3/uL — AB (ref 0.9–3.3)
LYMPH%: 12.4 % — ABNORMAL LOW (ref 14.0–49.7)
MCH: 31.4 pg (ref 25.1–34.0)
MCHC: 32.2 g/dL (ref 31.5–36.0)
MCV: 97.5 fL (ref 79.5–101.0)
MONO#: 0.4 10*3/uL (ref 0.1–0.9)
MONO%: 6.2 % (ref 0.0–14.0)
NEUT%: 79.1 % — ABNORMAL HIGH (ref 38.4–76.8)
NEUTROS ABS: 4.5 10*3/uL (ref 1.5–6.5)
Platelets: 333 10*3/uL (ref 145–400)
RBC: 3.21 10*6/uL — ABNORMAL LOW (ref 3.70–5.45)
RDW: 17.9 % — ABNORMAL HIGH (ref 11.2–14.5)
WBC: 5.7 10*3/uL (ref 3.9–10.3)

## 2013-06-08 LAB — COMPREHENSIVE METABOLIC PANEL (CC13)
ALT: 10 U/L (ref 0–55)
ANION GAP: 9 meq/L (ref 3–11)
AST: 14 U/L (ref 5–34)
Albumin: 2.9 g/dL — ABNORMAL LOW (ref 3.5–5.0)
Alkaline Phosphatase: 52 U/L (ref 40–150)
BUN: 8.8 mg/dL (ref 7.0–26.0)
CALCIUM: 8.4 mg/dL (ref 8.4–10.4)
CO2: 30 meq/L — AB (ref 22–29)
CREATININE: 0.5 mg/dL — AB (ref 0.6–1.1)
Chloride: 104 mEq/L (ref 98–109)
Glucose: 99 mg/dl (ref 70–140)
Potassium: 3.4 mEq/L — ABNORMAL LOW (ref 3.5–5.1)
SODIUM: 143 meq/L (ref 136–145)
TOTAL PROTEIN: 6 g/dL — AB (ref 6.4–8.3)
Total Bilirubin: 0.22 mg/dL (ref 0.20–1.20)

## 2013-06-08 NOTE — Telephone Encounter (Signed)
gv pt appt schedule for may. central will call w/ct scan - pt aware. pt aware i will call her w/appt for dr Collene Mares - hold times at office too long.

## 2013-06-08 NOTE — Progress Notes (Signed)
OFFICE PROGRESS NOTE  Interval history:  Sara Cochran returns for scheduled followup. She was hospitalized 04/23/2013 through 05/01/2013 with dehydration and failure to thrive. She was found to have a urinary tract infection. She was also found to have a right arm DVT and was discharged home on Lovenox with a three-month course of planned.  She is feeling better. Her energy is slowly improving. Appetite is better. She is gaining weight. No further diarrhea. She is having 1-3 formed stools per day. No blood or pain with bowel movements.   Objective: Filed Vitals:   06/08/13 1041  BP: 172/60  Pulse: 102  Temp: 97.1 F (36.2 C)  Resp: 16   Oropharynx is without thrush or ulceration. No palpable cervical, supraclavicular or axillary lymph nodes. Lungs clear. Regular cardiac rhythm. Abdomen soft. No hepatomegaly. No leg edema. External hemorrhoids. No erythema or skin breakdown at the perineum. No mass on rectal exam.   Lab Results: Lab Results  Component Value Date   WBC 5.7 06/08/2013   HGB 10.1* 06/08/2013   HCT 31.3* 06/08/2013   MCV 97.5 06/08/2013   PLT 333 06/08/2013   NEUTROABS 4.5 06/08/2013    Chemistry:    Chemistry      Component Value Date/Time   NA 143 06/08/2013 1020   NA 136* 05/01/2013 0540   K 3.4* 06/08/2013 1020   K 3.7 05/01/2013 0540   CL 99 05/01/2013 0540   CO2 30* 06/08/2013 1020   CO2 27 05/01/2013 0540   BUN 8.8 06/08/2013 1020   BUN 3* 05/01/2013 0540   CREATININE 0.5* 06/08/2013 1020   CREATININE 0.34* 05/01/2013 0540   CREATININE 0.62 02/03/2013 1257      Component Value Date/Time   CALCIUM 8.4 06/08/2013 1020   CALCIUM 7.1* 05/01/2013 0540   ALKPHOS 52 06/08/2013 1020   ALKPHOS 40 04/24/2013 0345   AST 14 06/08/2013 1020   AST 13 04/24/2013 0345   ALT 10 06/08/2013 1020   ALT 9 04/24/2013 0345   BILITOT 0.22 06/08/2013 1020   BILITOT 0.5 04/24/2013 0345       Studies/Results: No results found.  Medications: I have reviewed the patient's current  medications.  Assessment/Plan: 1. 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 Initiation of radiation and cycle 1 5-FU/mitomycin C. 02/23/2013, cycle 2 03/23/2013. Completed radiation 04/06/2013. 2. CT evidence of a perirectal abscess 01/22/2013. Improved on CT 04/27/2013. 3. COPD. 4. History of thrombocytopenia/neutropenia secondary to chemotherapy.  5. History of pain secondary to radiation skin breakdown at the perineum. Resolved. 6. Weight loss. She is gaining weight. 7. History of diarrhea. Likely secondary to radiation. Stool negative for C. difficile 04/15/2013.  Resolved. 8. Hospitalization 04/23/2013 through 05/01/2013 with dehydration and failure to thrive. 9. Right arm DVT 04/27/2013. Three-month course of Lovenox planned.   Dispositon-she appears improved. Dr. Benay Spice recommends a referral to Dr. Collene Mares for reassessment of the anal cancer in the next 4-6 weeks. We are also referring her for a pelvic CT in approximately 6 weeks to followup the perirectal abscess.  She will return for a followup visit in 6 weeks. She will contact the office in the interim with any problems.  Plan reviewed with Dr. Benay Spice.   Ned Card ANP/GNP-BC

## 2013-06-09 ENCOUNTER — Telehealth: Payer: Self-pay | Admitting: *Deleted

## 2013-06-09 NOTE — Telephone Encounter (Signed)
VM requesting return call to discuss "the liquid medicine I was put on yesterday". Called back: she reports she can't drink the contrast that she was given yesterday for her scan. She has used a different type in past. Gave her phone # for radiology department to call regarding this issue and they will make arrangements for her to pick up the contrast she prefers or have family member pick it up for her.

## 2013-06-11 ENCOUNTER — Ambulatory Visit
Admission: RE | Admit: 2013-06-11 | Discharge: 2013-06-11 | Disposition: A | Discharge status: Home / Self Care | Payer: Medicare Other | Source: Ambulatory Visit | Attending: Radiation Oncology | Admitting: Radiation Oncology

## 2013-06-11 ENCOUNTER — Encounter: Payer: Self-pay | Admitting: Radiation Oncology

## 2013-06-11 VITALS — BP 161/76 | HR 101 | Temp 98.1°F | Resp 20 | Ht 62.0 in | Wt 91.3 lb

## 2013-06-11 DIAGNOSIS — C210 Malignant neoplasm of anus, unspecified: Secondary | ICD-10-CM

## 2013-06-11 NOTE — Progress Notes (Signed)
Follow up anal radiation, has gained 10 lbs , appetite good, still has loose stools 1-3 daily, says she can still has the abscess Energy a lot better no c/o pain at present 3:15 PM

## 2013-06-12 ENCOUNTER — Telehealth: Payer: Self-pay | Admitting: Oncology

## 2013-06-12 NOTE — Telephone Encounter (Signed)
s/w pt re appt w/dr mann 5/21 @ 10am. pt given appt schedule for may yesterday and central to contact pt re ct.

## 2013-06-13 NOTE — Progress Notes (Signed)
Radiation Oncology         [phone] ________________________________  Name: Sara Cochran MRN: [id number]  Date: 06/11/2013  DOB: 28-Nov-1933  Follow-Up Visit Note  CC: Alonza Bogus, MD  Leighton Ruff, MD  Diagnosis:   Squamous cell carcinoma of the anal canal  Interval Since Last Radiation:  2 months   Narrative:  The patient returns today for routine follow-up.  The patient completed her radiation treatment on 04/06/2013. The patient indicates that she is feeling much better. Her weight has been increasing and she has gained approximately 10 pounds. She has loose stools approximately 1-3 per day but no frank diarrhea. Her energy level has been improving. The patient had a CT scan of the abdomen and pelvis on 04/27/2013. The perirectal fluid collection/abscess was resolving. She is scheduled for a repeat CT scan in May. She did see medical oncology again today.   She indicates that her skin is doing very well with no ongoing significant irritation. This has healed very well.                             ALLERGIES:  is allergic to aspirin; erythromycin; and niacin and related.  Meds: Current Outpatient Prescriptions  Medication Sig Dispense Refill  . albuterol (PROVENTIL HFA;VENTOLIN HFA) 108 (90 BASE) MCG/ACT inhaler Inhale 2 puffs into the lungs every 6 (six) hours as needed for wheezing or shortness of breath.      . ALPRAZolam (XANAX) 1 MG tablet Take 1 mg by mouth at bedtime as needed for sleep.      . calcium carbonate (OS-CAL - DOSED IN MG OF ELEMENTAL CALCIUM) 1250 MG tablet Take 1 tablet (500 mg of elemental calcium total) by mouth 2 (two) times daily with a meal.  60 tablet  2  . diphenoxylate-atropine (LOMOTIL) 2.5-0.025 MG per tablet Take 2 tablets by mouth 4 (four) times daily as needed for diarrhea or loose stools.  40 tablet  0  . enoxaparin (LOVENOX) 80 MG/0.8ML injection Inject 0.65 mLs (65 mg total) into the skin daily.  30 Syringe  2  . HYDROcodone-acetaminophen  (LORCET 10/650) 10-650 MG per tablet Take 1 tablet by mouth every 6 (six) hours as needed for pain.      Marland Kitchen ipratropium-albuterol (DUONEB) 0.5-2.5 (3) MG/3ML SOLN Take 3 mLs by nebulization every 6 (six) hours as needed (shortness of breath).      Marland Kitchen omeprazole (PRILOSEC) 20 MG capsule Take 20 mg by mouth daily.      . pravastatin (PRAVACHOL) 40 MG tablet Take 40 mg by mouth daily.      Marland Kitchen dronabinol (MARINOL) 5 MG capsule Take 1 capsule (5 mg total) by mouth 2 (two) times daily before lunch and supper.  60 capsule  2  . feeding supplement, ENSURE COMPLETE, (ENSURE COMPLETE) LIQD Take 237 mLs by mouth 3 (three) times daily with meals.      Marland Kitchen loperamide (IMODIUM) 2 MG capsule Take 2 mg by mouth as needed for diarrhea or loose stools.       No current facility-administered medications for this encounter.    Physical Findings: The patient is in no acute distress. Patient is alert and oriented.  height is 5\' 2"  (1.575 m) and weight is 91 lb 4.8 oz (41.413 kg). Her oral temperature is 98.1 F (36.7 C). Her blood pressure is 161/76 and her pulse is 101. Her respiration is 20. Marland Kitchen   Exam deferred today at  patient's request as she was examined by medical oncology.  Lab Findings: Lab Results  Component Value Date   WBC 5.7 06/08/2013   HGB 10.1* 06/08/2013   HCT 31.3* 06/08/2013   MCV 97.5 06/08/2013   PLT 333 06/08/2013     Radiographic Findings: No results found.  Impression:    The patient appears to be doing satisfactorily at this time and her overall performance status has been improving. She has increased appetite and has gained some weight and she has a goal of continuing to do so for another 10-15 pounds. She was seen by medical oncology earlier today.  Plan:  The patient will followup in our clinic in approximately 3 months.   Jodelle Gross, M.D., Ph.D.

## 2013-06-24 ENCOUNTER — Telehealth: Payer: Self-pay | Admitting: *Deleted

## 2013-06-24 NOTE — Telephone Encounter (Signed)
Has #4 Lovenox injections left and calling for another 28 day supply. Was given samples on 05/26/13 for a 3 month course that will end on 07/25/13. Reports she can't afford the co pay with her insurance plan. Dose is 80 mg daily.

## 2013-06-24 NOTE — Telephone Encounter (Signed)
Notified patient that #30 day supply of Lovenox 80 mg is ready for pick up. Her daily dose is 65 mg= 0.65 ml. Dose written on box.

## 2013-07-07 DIAGNOSIS — K219 Gastro-esophageal reflux disease without esophagitis: Secondary | ICD-10-CM | POA: Diagnosis not present

## 2013-07-07 DIAGNOSIS — R933 Abnormal findings on diagnostic imaging of other parts of digestive tract: Secondary | ICD-10-CM | POA: Diagnosis not present

## 2013-07-07 DIAGNOSIS — Z85048 Personal history of other malignant neoplasm of rectum, rectosigmoid junction, and anus: Secondary | ICD-10-CM | POA: Diagnosis not present

## 2013-07-17 ENCOUNTER — Other Ambulatory Visit: Payer: Self-pay | Admitting: Nurse Practitioner

## 2013-07-17 ENCOUNTER — Encounter (HOSPITAL_COMMUNITY): Payer: Self-pay

## 2013-07-17 ENCOUNTER — Ambulatory Visit (HOSPITAL_COMMUNITY)
Admission: RE | Admit: 2013-07-17 | Discharge: 2013-07-17 | Disposition: A | Discharge status: Home / Self Care | Payer: Medicare Other | Source: Ambulatory Visit | Attending: Nurse Practitioner | Admitting: Nurse Practitioner

## 2013-07-17 DIAGNOSIS — C210 Malignant neoplasm of anus, unspecified: Secondary | ICD-10-CM | POA: Insufficient documentation

## 2013-07-17 DIAGNOSIS — M810 Age-related osteoporosis without current pathological fracture: Secondary | ICD-10-CM | POA: Insufficient documentation

## 2013-07-17 DIAGNOSIS — Z9221 Personal history of antineoplastic chemotherapy: Secondary | ICD-10-CM | POA: Insufficient documentation

## 2013-07-17 DIAGNOSIS — Z923 Personal history of irradiation: Secondary | ICD-10-CM | POA: Insufficient documentation

## 2013-07-17 MED ORDER — IOHEXOL 300 MG/ML  SOLN
50.0000 mL | Freq: Once | INTRAMUSCULAR | Status: AC | PRN
  Administered 2013-07-17: 50 mL via ORAL

## 2013-07-21 ENCOUNTER — Telehealth: Payer: Self-pay | Admitting: Oncology

## 2013-07-21 ENCOUNTER — Ambulatory Visit (HOSPITAL_BASED_OUTPATIENT_CLINIC_OR_DEPARTMENT_OTHER): Payer: Medicare Other | Admitting: Oncology

## 2013-07-21 VITALS — BP 175/60 | HR 92 | Temp 98.1°F | Resp 19 | Ht 62.0 in | Wt 94.8 lb

## 2013-07-21 DIAGNOSIS — Z86718 Personal history of other venous thrombosis and embolism: Secondary | ICD-10-CM

## 2013-07-21 DIAGNOSIS — R933 Abnormal findings on diagnostic imaging of other parts of digestive tract: Secondary | ICD-10-CM | POA: Diagnosis not present

## 2013-07-21 DIAGNOSIS — C210 Malignant neoplasm of anus, unspecified: Secondary | ICD-10-CM

## 2013-07-21 DIAGNOSIS — Z85048 Personal history of other malignant neoplasm of rectum, rectosigmoid junction, and anus: Secondary | ICD-10-CM | POA: Diagnosis not present

## 2013-07-21 DIAGNOSIS — R634 Abnormal weight loss: Secondary | ICD-10-CM | POA: Diagnosis not present

## 2013-07-21 DIAGNOSIS — K219 Gastro-esophageal reflux disease without esophagitis: Secondary | ICD-10-CM | POA: Diagnosis not present

## 2013-07-21 NOTE — Progress Notes (Signed)
  [facility] OFFICE PROGRESS NOTE   Diagnosis: Anal cancer  INTERVAL HISTORY:   She returns as scheduled. Good appetite. Occasional diarrhea. No rectal pain. She saw Dr. Collene Mares earlier today.  Objective:  Vital signs in last 24 hours:  There were no vitals taken for this visit.    HEENT: Neck without mass Lymphatics: No cervical, supra-clavicular, axillary, or inguinal node Resp: Distant breath sounds Cardio: Regular rate and rhythm GI: No hepatosplenomegaly Vascular: No leg edema, no arm edema  Skin: Small ecchymosis at the right lower abdomen     Imaging:  CT of the pelvis 07/17/2013-persistent mild rectal wall thickening, no pelvic mass or lymphadenopathy. Small amount of air in the vagina. No inguinal mass or adenopathy  Medications: I have reviewed the patient's current medications.  Assessment/Plan: 1. 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 Initiation of radiation and cycle 1 5-FU/mitomycin C. 02/23/2013, cycle 2 03/23/2013. Completed radiation 04/06/2013. 2. CT evidence of a perirectal abscess 01/22/2013. Improved on CT 04/27/2013. No abscess noted on the CT 07/17/2013 3. COPD. 4. History of thrombocytopenia/neutropenia secondary to chemotherapy.  5. History of pain secondary to radiation skin breakdown at the perineum. Resolved. 6. Weight loss. She is gaining weight. 7. History of diarrhea. Likely secondary to radiation. Stool negative for C. difficile 04/15/2013. Resolved. 8. Hospitalization 04/23/2013 through 05/01/2013 with dehydration and failure to thrive. 9. Right arm DVT 04/27/2013. She has completed a 3 month course of Lovenox, Lovenox will be discontinued today    Disposition:  Ms. Grieves is in clinical remission from anal cancer. She will see Dr. Lisbeth Renshaw in June. We will ask Dr. Collene Mares to perform an endoscopic evaluation of the rectum within the next few months to  assess for remaining tumor. Ms. Vastine will return for an office visit in 4 months.  Ladell Pier, MD  07/21/2013  12:09 PM

## 2013-07-21 NOTE — Telephone Encounter (Signed)
per pof to shappt/ per Rose could sch pt in this slot-will mail pt sch

## 2013-07-22 ENCOUNTER — Other Ambulatory Visit: Payer: Self-pay | Admitting: *Deleted

## 2013-07-28 ENCOUNTER — Telehealth: Payer: Self-pay | Admitting: *Deleted

## 2013-07-28 NOTE — Telephone Encounter (Signed)
Message from pt requesting to reschedule 9/11 appt. Will be out of town 9/7-9/12/15. Request forwarded to schedulers for new appt. Informed pt she will be contacted with new appt.

## 2013-07-30 ENCOUNTER — Telehealth: Payer: Self-pay | Admitting: Oncology

## 2013-07-30 NOTE — Telephone Encounter (Signed)
pt called and r/s appt from 9/11 to 9/18

## 2013-09-02 DIAGNOSIS — C189 Malignant neoplasm of colon, unspecified: Secondary | ICD-10-CM | POA: Diagnosis not present

## 2013-09-02 DIAGNOSIS — I10 Essential (primary) hypertension: Secondary | ICD-10-CM | POA: Diagnosis not present

## 2013-09-02 DIAGNOSIS — J449 Chronic obstructive pulmonary disease, unspecified: Secondary | ICD-10-CM | POA: Diagnosis not present

## 2013-09-02 DIAGNOSIS — R42 Dizziness and giddiness: Secondary | ICD-10-CM | POA: Diagnosis not present

## 2013-09-02 DIAGNOSIS — N390 Urinary tract infection, site not specified: Secondary | ICD-10-CM | POA: Diagnosis not present

## 2013-09-09 ENCOUNTER — Ambulatory Visit
Admission: RE | Admit: 2013-09-09 | Discharge: 2013-09-09 | Disposition: A | Discharge status: Home / Self Care | Payer: Medicare Other | Source: Ambulatory Visit | Attending: Radiation Oncology | Admitting: Radiation Oncology

## 2013-09-09 ENCOUNTER — Encounter: Payer: Self-pay | Admitting: Radiation Oncology

## 2013-09-09 VITALS — BP 177/74 | HR 88 | Temp 97.8°F | Resp 20 | Ht 62.0 in | Wt 99.1 lb

## 2013-09-09 DIAGNOSIS — C210 Malignant neoplasm of anus, unspecified: Secondary | ICD-10-CM | POA: Diagnosis not present

## 2013-09-09 NOTE — Progress Notes (Signed)
Radiation Oncology         [phone] ________________________________  Name: Sara Cochran MRN: [id number]  Date: 09/09/2013  DOB: 1933-08-05  Follow-Up Visit Note  CC: Alonza Bogus, MD  Alonza Bogus, MD  Diagnosis:   Squamous cell carcinoma of the anal canal  Interval Since Last Radiation:  5 months   Narrative:  The patient returns today for routine follow-up.  Patient states that she has been doing well overall. The patient denies any pain in the anal region. She does have some occasional diarrhea, possibly one time per week. She denies any blood per rectum. The patient does note some occasional loss of control of her bowel movements although this has been improving significantly since she finished treatment.                              ALLERGIES:  is allergic to aspirin; erythromycin; and niacin and related.  Meds: Current Outpatient Prescriptions  Medication Sig Dispense Refill  . amoxicillin (AMOXIL) 500 MG tablet Take 500 mg by mouth 3 (three) times daily.      . calcium-vitamin D (OSCAL WITH D) 500-200 MG-UNIT per tablet Take 1 tablet by mouth daily with breakfast.      . meclizine (ANTIVERT) 25 MG tablet Take 25 mg by mouth.      Marland Kitchen albuterol (PROVENTIL HFA;VENTOLIN HFA) 108 (90 BASE) MCG/ACT inhaler Inhale 2 puffs into the lungs every 6 (six) hours as needed for wheezing or shortness of breath.      . ALPRAZolam (XANAX) 1 MG tablet Take 1 mg by mouth at bedtime as needed for sleep.      . calcium carbonate (OS-CAL - DOSED IN MG OF ELEMENTAL CALCIUM) 1250 MG tablet Take 1 tablet (500 mg of elemental calcium total) by mouth 2 (two) times daily with a meal.  60 tablet  2  . diphenoxylate-atropine (LOMOTIL) 2.5-0.025 MG per tablet Take 2 tablets by mouth 4 (four) times daily as needed for diarrhea or loose stools.  40 tablet  0  . HYDROcodone-acetaminophen (LORCET 10/650) 10-650 MG per tablet Take 1 tablet by mouth every 6 (six) hours as needed for pain.      Marland Kitchen  ipratropium-albuterol (DUONEB) 0.5-2.5 (3) MG/3ML SOLN Take 3 mLs by nebulization every 6 (six) hours as needed (shortness of breath).      . loperamide (IMODIUM) 2 MG capsule Take 2 mg by mouth as needed for diarrhea or loose stools.      Marland Kitchen omeprazole (PRILOSEC) 20 MG capsule Take 20 mg by mouth daily.      . pravastatin (PRAVACHOL) 40 MG tablet Take 40 mg by mouth daily.       No current facility-administered medications for this encounter.    Physical Findings: The patient is in no acute distress. Patient is alert and oriented.  height is 5\' 2"  (1.575 m) and weight is 99 lb 1.6 oz (44.951 kg). Her oral temperature is 97.8 F (36.6 C). Her blood pressure is 177/74 and her pulse is 88. Her respiration is 20 and oxygen saturation is 89%. .   No inguinal lymphadenopathy present. On rectal exam, the patient's skin has healed well. On digital rectal exam, no discrete tumor present. The patient has what appears to be significant scarring with narrowing in the distal rectum but it felt like regression of the tumor itself as compared to her initial exam.  Lab Findings: Lab Results  Component  Value Date   WBC 5.7 06/08/2013   HGB 10.1* 06/08/2013   HCT 31.3* 06/08/2013   MCV 97.5 06/08/2013   PLT 333 06/08/2013     Radiographic Findings: No results found.  Impression:    The patient clinically is doing well at this time. The patient had a CT scan of the pelvis on 07/17/2013 which I have personally reviewed. Some diffuse rectal wall thickening is present without any pelvic mass or adenopathy. This appeared to be a good report. The patient is scheduled for endoscopy coming up in a couple of months with GI medicine.  Plan:   Followup in 6 months.  I spent 20 minutes with the patient today, the majority of which was spent counseling the patient on the diagnosis of cancer and coordinating care.   Jodelle Gross, M.D., Ph.D.

## 2013-09-09 NOTE — Progress Notes (Signed)
Follow up rectal rad ttxs 02/23/13-04/06/13, no c/o pain, nausea, very seldom gets constipation, has occasional diarrhea soft,not watery stated, appt in September 18/15 with Dr. Benay Spice, Dr. Collene Mares Dec 21, 2013 for sigmoidoscopy, appetite great, sob mild room air 89%,  Energy good but tires easily, new meds meclizine ; and amoxicillin for UTI last CT pelvis 07/17/13

## 2013-11-03 DIAGNOSIS — J449 Chronic obstructive pulmonary disease, unspecified: Secondary | ICD-10-CM | POA: Diagnosis not present

## 2013-11-03 DIAGNOSIS — C189 Malignant neoplasm of colon, unspecified: Secondary | ICD-10-CM | POA: Diagnosis not present

## 2013-11-03 DIAGNOSIS — E46 Unspecified protein-calorie malnutrition: Secondary | ICD-10-CM | POA: Diagnosis not present

## 2013-11-03 DIAGNOSIS — M545 Low back pain, unspecified: Secondary | ICD-10-CM | POA: Diagnosis not present

## 2013-11-16 ENCOUNTER — Other Ambulatory Visit: Payer: Self-pay | Admitting: Oncology

## 2013-11-16 DIAGNOSIS — C210 Malignant neoplasm of anus, unspecified: Secondary | ICD-10-CM

## 2013-11-27 ENCOUNTER — Ambulatory Visit: Payer: Medicare Other | Admitting: Oncology

## 2013-11-30 ENCOUNTER — Telehealth: Payer: Self-pay | Admitting: Oncology

## 2013-11-30 NOTE — Telephone Encounter (Signed)
s/w pt re appt for 9/16 @ 11am. moved from 9/18 due to GI clinic.

## 2013-12-02 ENCOUNTER — Telehealth: Payer: Self-pay | Admitting: Oncology

## 2013-12-02 ENCOUNTER — Ambulatory Visit (HOSPITAL_BASED_OUTPATIENT_CLINIC_OR_DEPARTMENT_OTHER): Payer: Medicare Other | Admitting: Oncology

## 2013-12-02 VITALS — BP 163/64 | HR 78 | Temp 97.8°F | Resp 18 | Ht 62.0 in | Wt 108.1 lb

## 2013-12-02 DIAGNOSIS — Z86718 Personal history of other venous thrombosis and embolism: Secondary | ICD-10-CM

## 2013-12-02 DIAGNOSIS — Z85048 Personal history of other malignant neoplasm of rectum, rectosigmoid junction, and anus: Secondary | ICD-10-CM

## 2013-12-02 DIAGNOSIS — C210 Malignant neoplasm of anus, unspecified: Secondary | ICD-10-CM

## 2013-12-02 NOTE — Progress Notes (Signed)
  [facility] OFFICE PROGRESS NOTE   Diagnosis: Anal cancer INTERVAL HISTORY:   She returns as scheduled. She feels well. She is gaining weight. No difficulty with bowel function. No bleeding. She is scheduled to undergo a lower endoscopy by Dr. Collene Mares next month.  Objective:  Vital signs in last 24 hours:  Blood pressure 163/64, pulse 78, temperature 97.8 F (36.6 C), temperature source Oral, resp. rate 18, height 5\' 2"  (1.575 m), weight 108 lb 1.6 oz (49.034 kg).    HEENT: Neck without mass Lymphatics: No cervical, supra-clavicular, axillary, or inguinal nodes Resp: Distant breath sounds, no respiratory distress Cardio: Regular rate and rhythm GI: No hepatosplenomegaly, nontender, no mass Vascular: No leg edema Rectal: Soft hemorrhoids at the anal verge, 2-3 cm proximal to the anal verge there is a narrowing of the rectum without a palpable mass. Bright red blood on the tip of the examination glove       Medications: I have reviewed the patient's current medications.  Assessment/Plan: 1. 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 Initiation of radiation and cycle 1 5-FU/mitomycin C. 02/23/2013, cycle 2 03/23/2013. Completed radiation 04/06/2013. 2. CT evidence of a perirectal abscess 01/22/2013. Improved on CT 04/27/2013. No abscess noted on the CT 07/17/2013 3. COPD. 4. History of thrombocytopenia/neutropenia secondary to chemotherapy.  5. History of pain secondary to radiation skin breakdown at the perineum. Resolved. 6. History of Weight loss. She is gaining weight. 7. History of diarrhea. Likely secondary to radiation. Stool negative for C. difficile 04/15/2013. Resolved. 8. Hospitalization 04/23/2013 through 05/01/2013 with dehydration and failure to thrive. 9. Right arm DVT 04/27/2013. She  completed a 3 month course of Lovenox 10. Apparent anorectal stricture on exam 12/02/2013-she is  scheduled undergo a lower endoscopy by Dr. Collene Mares within the next few weeks   Disposition:  Ms. Waszak remains in clinical remission from anal cancer. She is scheduled to see Dr. Lisbeth Renshaw in December. She will return for an office visit here in 5 months. She will see Dr. Collene Mares for an endoscopic evaluation in the next few weeks. She appears to have an anal stricture and may require a dilatation procedure.  Betsy Coder, MD  12/02/2013  11:21 AM

## 2013-12-02 NOTE — Telephone Encounter (Signed)
Pt confirmed labs/ov per 09/16 POF, gave pt AVS....KJ °

## 2013-12-04 ENCOUNTER — Ambulatory Visit: Payer: Medicare Other | Admitting: Oncology

## 2013-12-07 DIAGNOSIS — Z23 Encounter for immunization: Secondary | ICD-10-CM | POA: Diagnosis not present

## 2013-12-21 DIAGNOSIS — Z85048 Personal history of other malignant neoplasm of rectum, rectosigmoid junction, and anus: Secondary | ICD-10-CM | POA: Diagnosis not present

## 2013-12-21 DIAGNOSIS — Z1211 Encounter for screening for malignant neoplasm of colon: Secondary | ICD-10-CM | POA: Diagnosis not present

## 2013-12-24 DIAGNOSIS — I10 Essential (primary) hypertension: Secondary | ICD-10-CM | POA: Diagnosis not present

## 2013-12-24 DIAGNOSIS — C189 Malignant neoplasm of colon, unspecified: Secondary | ICD-10-CM | POA: Diagnosis not present

## 2013-12-24 DIAGNOSIS — J441 Chronic obstructive pulmonary disease with (acute) exacerbation: Secondary | ICD-10-CM | POA: Diagnosis not present

## 2014-02-03 DIAGNOSIS — M545 Low back pain: Secondary | ICD-10-CM | POA: Diagnosis not present

## 2014-02-03 DIAGNOSIS — C189 Malignant neoplasm of colon, unspecified: Secondary | ICD-10-CM | POA: Diagnosis not present

## 2014-02-03 DIAGNOSIS — J441 Chronic obstructive pulmonary disease with (acute) exacerbation: Secondary | ICD-10-CM | POA: Diagnosis not present

## 2014-02-18 ENCOUNTER — Ambulatory Visit
Admission: RE | Admit: 2014-02-18 | Discharge: 2014-02-18 | Disposition: A | Discharge status: Home / Self Care | Payer: Medicare Other | Source: Ambulatory Visit | Attending: Radiation Oncology | Admitting: Radiation Oncology

## 2014-02-18 ENCOUNTER — Encounter: Payer: Self-pay | Admitting: Radiation Oncology

## 2014-02-18 NOTE — Progress Notes (Signed)
Patient informed MD will be late ,she preferred to reschedule aoppt f/u, stated she didn't like driving late and she would call Dr.Mann's ofice to send Dr. Baldo Daub of her visit  3:52 PM

## 2014-02-18 NOTE — Progress Notes (Signed)
Follow up s/p rad to anal canal ca, 02/23/13-04/06/13 Patient having regular bowel movements occasional bouts of diarrhea, no nausea, saw Dr.Mnn 12/21/13, had flexible siigmoidoscopy, said looked good" per patient, nothng in Epic from Dr.Mann,

## 2014-02-19 ENCOUNTER — Ambulatory Visit: Payer: Medicare Other | Admitting: Radiation Oncology

## 2014-02-19 ENCOUNTER — Ambulatory Visit
Admission: RE | Admit: 2014-02-19 | Discharge: 2014-02-19 | Disposition: A | Discharge status: Home / Self Care | Payer: Medicare Other | Source: Ambulatory Visit | Attending: Radiation Oncology | Admitting: Radiation Oncology

## 2014-02-19 DIAGNOSIS — C210 Malignant neoplasm of anus, unspecified: Secondary | ICD-10-CM

## 2014-03-03 ENCOUNTER — Encounter: Payer: Self-pay | Admitting: Radiation Oncology

## 2014-03-03 VITALS — BP 126/55 | HR 82 | Temp 97.6°F | Resp 22 | Ht 62.0 in | Wt 113.2 lb

## 2014-03-03 DIAGNOSIS — N390 Urinary tract infection, site not specified: Secondary | ICD-10-CM | POA: Diagnosis not present

## 2014-03-03 DIAGNOSIS — C210 Malignant neoplasm of anus, unspecified: Secondary | ICD-10-CM | POA: Diagnosis not present

## 2014-03-03 NOTE — Progress Notes (Signed)
follow up  S/p rad txs 02/23/14-04/06/13, vitals good, sob with exertion, purse-lip breathing brought room air sats from 82% to 91%, no pain, no coughing up anything, appetite fair, energy level good, no c/o pain at present Occasional bouts of diarrhea stated, saw Dr. Collene Mares recently , patient asked Dr.Maann office to send office visit, none in Epic 10:12 AM

## 2014-03-03 NOTE — Progress Notes (Signed)
Radiation Oncology         [phone] ________________________________  Name: Sara Cochran MRN: [id number]  Date: 03/03/2014  DOB: [birth year]  Follow-Up Visit Note  CC: Alonza Bogus, MD  Leighton Ruff, MD  Diagnosis:   Squamous cell carcinoma of the anal canal  Interval Since Last Radiation:  One year   Narrative:  The patient returns today for routine follow-up.  Patient states that she has been doing well overall. The patient denies any pain in the anal region. She was seen by GI medicine in October and received a good report through anoscopy. She notes that no biopsies were completed. We are attempting to obtain these records.  The patient notes some occasional loss of control in terms of bowel movements. She states that this does not occur all of the time and does seem to be related to her diet. We discussed possible management of this further with physical therapy. The patient however is not interested in this at this time. No blood. No new complaints.                              ALLERGIES:  is allergic to aspirin; erythromycin; and niacin and related.  Meds: Current Outpatient Prescriptions  Medication Sig Dispense Refill  . albuterol (PROVENTIL HFA;VENTOLIN HFA) 108 (90 BASE) MCG/ACT inhaler Inhale 2 puffs into the lungs every 6 (six) hours as needed for wheezing or shortness of breath.    . ALPRAZolam (XANAX) 1 MG tablet Take 1 mg by mouth at bedtime as needed for sleep.    . calcium carbonate (OS-CAL - DOSED IN MG OF ELEMENTAL CALCIUM) 1250 MG tablet Take 1 tablet (500 mg of elemental calcium total) by mouth 2 (two) times daily with a meal. 60 tablet 2  . diphenoxylate-atropine (LOMOTIL) 2.5-0.025 MG per tablet Take 2 tablets by mouth 4 (four) times daily as needed for diarrhea or loose stools. 40 tablet 0  . HYDROcodone-acetaminophen (LORCET 10/650) 10-650 MG per tablet Take 1 tablet by mouth every 6 (six) hours as needed for pain.    Marland Kitchen ipratropium-albuterol (DUONEB)  0.5-2.5 (3) MG/3ML SOLN Take 3 mLs by nebulization every 6 (six) hours as needed (shortness of breath).    . meclizine (ANTIVERT) 25 MG tablet Take 25 mg by mouth.    Marland Kitchen omeprazole (PRILOSEC) 20 MG capsule Take 20 mg by mouth daily.    . pravastatin (PRAVACHOL) 40 MG tablet Take 40 mg by mouth daily.    . prochlorperazine (COMPAZINE) 5 MG tablet TAKE ONE TABLET BY MOUTH EVERY 6 HOURS AS NEEDED FOR NAUSEA OR VOMITING 30 tablet 1   No current facility-administered medications for this encounter.    Physical Findings: The patient is in no acute distress. Patient is alert and oriented.  height is 5\' 2"  (1.575 m) and weight is 113 lb 3.2 oz (51.347 kg). Her oral temperature is 97.6 F (36.4 C). Her blood pressure is 126/55 and her pulse is 82. Her respiration is 22 and oxygen saturation is 91%. .   No inguinal lymphadenopathy present.  On rectal exam, the patient's skin has healed well. On digital rectal exam, no discrete tumor present. Some continued narrowing present but this does not appear to be any worse. No nodularity. Stable exam.   Lab Findings: Lab Results  Component Value Date   WBC 5.7 06/08/2013   HGB 10.1* 06/08/2013   HCT 31.3* 06/08/2013   MCV 97.5 06/08/2013  PLT 333 06/08/2013     Radiographic Findings: No results found.  Impression:    The patient clinically is doing well at this time. No concerning findings on exam today. I recommended for the patient to see physical therapy regarding pelvic/rectal issues including incontinence but the patient is not interested in this at this time. She will let us know this changes.  Plan:   Followup in 6 months.  I spent 15 minutes with the patient today, the majority of which was spent counseling the patient on the diagnosis of cancer and coordinating care.   Jodelle Gross, M.D., Ph.D.

## 2014-05-03 ENCOUNTER — Telehealth: Payer: Self-pay | Admitting: Nurse Practitioner

## 2014-05-03 NOTE — Telephone Encounter (Signed)
Pt called to r/s due to weather, pt confirmed ML visit.... KJ

## 2014-05-04 ENCOUNTER — Ambulatory Visit: Payer: Medicare Other | Admitting: Nurse Practitioner

## 2014-05-06 DIAGNOSIS — C189 Malignant neoplasm of colon, unspecified: Secondary | ICD-10-CM | POA: Diagnosis not present

## 2014-05-06 DIAGNOSIS — J449 Chronic obstructive pulmonary disease, unspecified: Secondary | ICD-10-CM | POA: Diagnosis not present

## 2014-05-06 DIAGNOSIS — R300 Dysuria: Secondary | ICD-10-CM | POA: Diagnosis not present

## 2014-05-06 DIAGNOSIS — M545 Low back pain: Secondary | ICD-10-CM | POA: Diagnosis not present

## 2014-05-12 DIAGNOSIS — N390 Urinary tract infection, site not specified: Secondary | ICD-10-CM | POA: Diagnosis not present

## 2014-05-13 ENCOUNTER — Ambulatory Visit (HOSPITAL_BASED_OUTPATIENT_CLINIC_OR_DEPARTMENT_OTHER): Payer: Medicare Other | Admitting: Nurse Practitioner

## 2014-05-13 ENCOUNTER — Telehealth: Payer: Self-pay | Admitting: Nurse Practitioner

## 2014-05-13 VITALS — BP 180/86 | HR 75 | Temp 98.3°F | Resp 19 | Ht 62.0 in | Wt 117.0 lb

## 2014-05-13 DIAGNOSIS — Z85048 Personal history of other malignant neoplasm of rectum, rectosigmoid junction, and anus: Secondary | ICD-10-CM | POA: Diagnosis not present

## 2014-05-13 DIAGNOSIS — R030 Elevated blood-pressure reading, without diagnosis of hypertension: Secondary | ICD-10-CM

## 2014-05-13 DIAGNOSIS — C210 Malignant neoplasm of anus, unspecified: Secondary | ICD-10-CM

## 2014-05-13 NOTE — Telephone Encounter (Signed)
Gave avs & calendar for October. See referral notes.

## 2014-05-13 NOTE — Progress Notes (Signed)
  [facility] OFFICE PROGRESS NOTE   Diagnosis:  Anal cancer  INTERVAL HISTORY:   Ms. Doukas returns as scheduled. She feels well. She has a good appetite. Bowels overall moving yearly. She has occasional loose stools which she relates to her diet. No pain or blood with bowel movements.   She reports a blood pressure of 130/64 one week ago at her PCPs office.  Objective:  Vital signs in last 24 hours:  Blood pressure 180/86, pulse 75, temperature 98.3 F (36.8 C), temperature source Oral, resp. rate 19, height 5\' 2"  (1.575 m), weight 117 lb (53.071 kg), SpO2 92 %.    HEENT: No thrush or ulcers. Lymphatics: No palpable cervical, supra clavicular, axillary or inguinal lymph nodes. Resp: Lungs clear bilaterally. Cardio: Regular rate and rhythm. GI: Abdomen soft and nontender. No hepatomegaly. Vascular: No leg edema. Rectal: Soft external hemorrhoids. No rectal mass.   Lab Results:  Lab Results  Component Value Date   WBC 5.7 06/08/2013   HGB 10.1* 06/08/2013   HCT 31.3* 06/08/2013   MCV 97.5 06/08/2013   PLT 333 06/08/2013   NEUTROABS 4.5 06/08/2013    Imaging:  No results found.  Medications: I have reviewed the patient's current medications.  Assessment/Plan: 1. 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 Initiation of radiation and cycle 1 5-FU/mitomycin C. 02/23/2013, cycle 2 03/23/2013. Completed radiation 04/06/2013. 2. CT evidence of a perirectal abscess 01/22/2013. Improved on CT 04/27/2013. No abscess noted on the CT 07/17/2013 3. COPD. 4. History of thrombocytopenia/neutropenia secondary to chemotherapy.  5. History of pain secondary to radiation skin breakdown at the perineum. Resolved. 6. History of weight loss. She is gaining weight. 7. History of diarrhea. Likely secondary to radiation. Stool negative for C. difficile 04/15/2013. Resolved. 8. Hospitalization  04/23/2013 through 05/01/2013 with dehydration and failure to thrive. 9. Right arm DVT 04/27/2013. She completed a 3 month course of Lovenox 10. Apparent anorectal stricture on exam 12/02/2013 11. Flexible sigmoidoscopy 12/21/2013 with a few telangiectasias from previous radiation treatments. Otherwise normal exam up to 20 cm.  Disposition: Ms. Tarnow stable. She remains in clinical remission from anal cancer. Dr. Benay Spice recommends a referral to Dr. Collene Mares for an anoscopy in approximately 4 months. She will return for a follow-up visit here in 8 months.  Blood pressure was elevated in the office today. She reports a normal blood pressure at her PCPs office last week. She will check her blood pressure at home and contact her PCP with persistent high readings.  Plan reviewed with Dr. Benay Spice.  Ned Card ANP/GNP-BC   05/13/2014  2:50 PM

## 2014-05-14 ENCOUNTER — Telehealth: Payer: Self-pay | Admitting: Oncology

## 2014-05-14 NOTE — Telephone Encounter (Signed)
Faxed records to Dr. Collene Mares [phone]

## 2014-06-10 DIAGNOSIS — I10 Essential (primary) hypertension: Secondary | ICD-10-CM | POA: Diagnosis not present

## 2014-06-17 DIAGNOSIS — I10 Essential (primary) hypertension: Secondary | ICD-10-CM | POA: Diagnosis not present

## 2014-06-24 DIAGNOSIS — I10 Essential (primary) hypertension: Secondary | ICD-10-CM | POA: Diagnosis not present

## 2014-08-04 DIAGNOSIS — C189 Malignant neoplasm of colon, unspecified: Secondary | ICD-10-CM | POA: Diagnosis not present

## 2014-08-04 DIAGNOSIS — M545 Low back pain: Secondary | ICD-10-CM | POA: Diagnosis not present

## 2014-08-04 DIAGNOSIS — J449 Chronic obstructive pulmonary disease, unspecified: Secondary | ICD-10-CM | POA: Diagnosis not present

## 2014-08-04 DIAGNOSIS — I10 Essential (primary) hypertension: Secondary | ICD-10-CM | POA: Diagnosis not present

## 2014-08-17 DIAGNOSIS — Z85048 Personal history of other malignant neoplasm of rectum, rectosigmoid junction, and anus: Secondary | ICD-10-CM | POA: Diagnosis not present

## 2014-08-17 DIAGNOSIS — K627 Radiation proctitis: Secondary | ICD-10-CM | POA: Diagnosis not present

## 2014-08-17 DIAGNOSIS — K625 Hemorrhage of anus and rectum: Secondary | ICD-10-CM | POA: Diagnosis not present

## 2014-08-17 DIAGNOSIS — K219 Gastro-esophageal reflux disease without esophagitis: Secondary | ICD-10-CM | POA: Diagnosis not present

## 2014-08-18 ENCOUNTER — Ambulatory Visit
Admission: RE | Admit: 2014-08-18 | Discharge: 2014-08-18 | Disposition: A | Discharge status: Home / Self Care | Payer: Medicare Other | Source: Ambulatory Visit | Attending: Radiation Oncology | Admitting: Radiation Oncology

## 2014-08-18 ENCOUNTER — Encounter: Payer: Self-pay | Admitting: Radiation Oncology

## 2014-08-18 VITALS — BP 147/59 | HR 79 | Temp 97.6°F | Resp 20

## 2014-08-18 DIAGNOSIS — C210 Malignant neoplasm of anus, unspecified: Secondary | ICD-10-CM

## 2014-08-18 NOTE — Progress Notes (Signed)
Radiation Oncology         [phone] ________________________________  Name: Sara Cochran MRN: [id number]  Date: 08/18/2014  DOB: [birth year]  Follow-Up Visit Note  CC: Alonza Bogus, MD  Leighton Ruff, MD  Diagnosis:   Squamous cell carcinoma of the anal canal  Interval Since Last Radiation:  04/06/2013 Site/dose: The patient was treated to the high dose region to a dose of 50.4 gray in 28 fractions. She was treated using a IMRT technique with daily image guidance.  Narrative:  The patient returns today for routine follow-up. CT pelvis report results in from 07/17/13. Patient had an anoscopy yesterday with Dr. Collene Mares with some bleeding afterwards. Stated some irritation afterwards. Her appetite is still good and has gained 4 lbs since her last visit. She still has intermittent diarrhea (twice this week) and has good bowel movements. States she experiences bad gas. Denies bleeding before the the anoscopy. She denies additional complaints at this time.  ALLERGIES:  is allergic to aspirin; erythromycin; and niacin and related.  Meds: Current Outpatient Prescriptions  Medication Sig Dispense Refill  . albuterol (PROVENTIL HFA;VENTOLIN HFA) 108 (90 BASE) MCG/ACT inhaler Inhale 2 puffs into the lungs every 6 (six) hours as needed for wheezing or shortness of breath.    . ALPRAZolam (XANAX) 1 MG tablet Take 1 mg by mouth at bedtime as needed for sleep.    Marland Kitchen amLODipine (NORVASC) 5 MG tablet Take 5 mg by mouth daily.    . calcium carbonate (OS-CAL - DOSED IN MG OF ELEMENTAL CALCIUM) 1250 MG tablet Take 1 tablet (500 mg of elemental calcium total) by mouth 2 (two) times daily with a meal. 60 tablet 2  . ciprofloxacin (CIPRO) 250 MG tablet Take 250 mg by mouth 3 times/day as needed-between meals & bedtime.    . diphenoxylate-atropine (LOMOTIL) 2.5-0.025 MG per tablet Take 2 tablets by mouth 4 (four) times daily as needed for diarrhea or loose stools. 40 tablet 0  . HYDROcodone-acetaminophen  (LORCET 10/650) 10-650 MG per tablet Take 1 tablet by mouth every 6 (six) hours as needed for pain.    Marland Kitchen ipratropium-albuterol (DUONEB) 0.5-2.5 (3) MG/3ML SOLN Take 3 mLs by nebulization every 6 (six) hours as needed (shortness of breath).    Marland Kitchen lisinopril (PRINIVIL,ZESTRIL) 40 MG tablet Take 40 mg by mouth daily.    . meclizine (ANTIVERT) 25 MG tablet Take 25 mg by mouth.    Marland Kitchen omeprazole (PRILOSEC) 20 MG capsule Take 20 mg by mouth daily.    . pravastatin (PRAVACHOL) 40 MG tablet Take 40 mg by mouth daily.    . prochlorperazine (COMPAZINE) 5 MG tablet TAKE ONE TABLET BY MOUTH EVERY 6 HOURS AS NEEDED FOR NAUSEA OR VOMITING 30 tablet 1   No current facility-administered medications for this encounter.    Physical Findings: The patient is in no acute distress. Patient is alert and oriented.  oral temperature is 97.6 F (36.4 C). Her blood pressure is 147/59 and her pulse is 79. Her respiration is 20.   Lab Findings: Lab Results  Component Value Date   WBC 5.7 06/08/2013   HGB 10.1* 06/08/2013   HCT 31.3* 06/08/2013   MCV 97.5 06/08/2013   PLT 333 06/08/2013     Radiographic Findings: No results found.  Impression: Pt is doing well with no clinical evidence of active disease.  Plan: Schedule a f/u with the pt in 8 months. The pt is scheduled to see Dr. Benay Spice in October. I advised the pt to  take 1 tablet of imodium a day or metamucil to help with the diarrhea.  The patient was seen today for 15 minutes, with the majority of the time spent counseling the patient on his diagnosis of cancer and coordinating his care.   This document serves as a record of services personally performed by Kyung Rudd, MD. It was created on his behalf by Darcus Austin, a trained medical scribe. The creation of this record is based on the scribe's personal observations and the provider's statements to them. This document has been checked and approved by the attending provider.     Jodelle Gross, M.D.,  Ph.D.

## 2014-08-18 NOTE — Progress Notes (Addendum)
Follow up s/p rad txs 02/23/13-04/06/13 rectal /anal , CT pelvis report results in from 07/17/13 last scan, patient  Had an anascopy yesterday with Dr. Collene Mares,, some bleeding afterwards  ,  stated some irritation  Still appetite good has gained 4 lbs since last visit, intermittent diarrhea still, good bowel movement today,  BP 147/59 mmHg  Pulse 79  Temp(Src) 97.6 F (36.4 C) (Oral)  Resp 20  Wt Readings from Last 3 Encounters:  05/13/14 117 lb (53.071 kg)  03/03/14 113 lb 3.2 oz (51.347 kg)  12/02/13 108 lb 1.6 oz (49.034 kg)

## 2014-10-06 DIAGNOSIS — Z85048 Personal history of other malignant neoplasm of rectum, rectosigmoid junction, and anus: Secondary | ICD-10-CM | POA: Diagnosis not present

## 2014-10-06 DIAGNOSIS — K6289 Other specified diseases of anus and rectum: Secondary | ICD-10-CM | POA: Diagnosis not present

## 2014-10-06 DIAGNOSIS — Z1211 Encounter for screening for malignant neoplasm of colon: Secondary | ICD-10-CM | POA: Diagnosis not present

## 2014-11-02 DIAGNOSIS — J449 Chronic obstructive pulmonary disease, unspecified: Secondary | ICD-10-CM | POA: Diagnosis not present

## 2014-11-02 DIAGNOSIS — M545 Low back pain: Secondary | ICD-10-CM | POA: Diagnosis not present

## 2014-11-02 DIAGNOSIS — N390 Urinary tract infection, site not specified: Secondary | ICD-10-CM | POA: Diagnosis not present

## 2014-11-02 DIAGNOSIS — I10 Essential (primary) hypertension: Secondary | ICD-10-CM | POA: Diagnosis not present

## 2014-11-02 DIAGNOSIS — C189 Malignant neoplasm of colon, unspecified: Secondary | ICD-10-CM | POA: Diagnosis not present

## 2014-12-07 DIAGNOSIS — Z23 Encounter for immunization: Secondary | ICD-10-CM | POA: Diagnosis not present

## 2014-12-10 IMAGING — CT CT ABD-PELV W/ CM
2 of 5 series · 15 of 46 positions shown, 17 images · IV contrast (OMNIPAQUE)
Comparison: None.

CLINICAL DATA: H/O anal cancer, f/u perictal fluid collection,
?abscess

EXAM:
CT ABDOMEN AND PELVIS WITH CONTRAST
TECHNIQUE: Multidetector CT imaging of the abdomen and pelvis was performed
using the standard protocol following bolus administration of
intravenous contrast.
CONTRAST:  80mL OMNIPAQUE IOHEXOL 300 MG/ML  SOLN

[Series 2: rtn a/p with · axial · 0.65mm/px · z∈[-580,-236]mm · 12 of 79 slices shown, 14 images]
[im 5/79  soft-tissue]
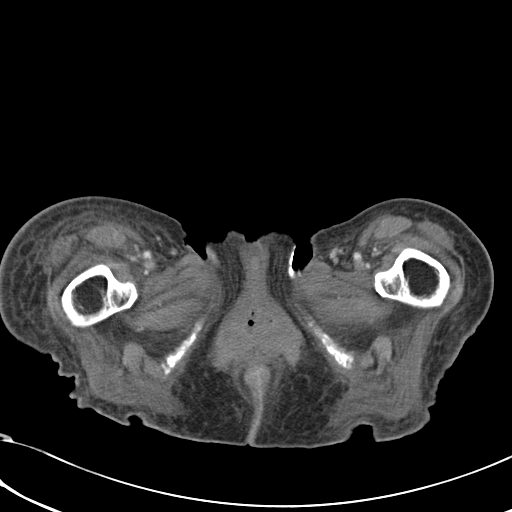
[im 5/79  bone]
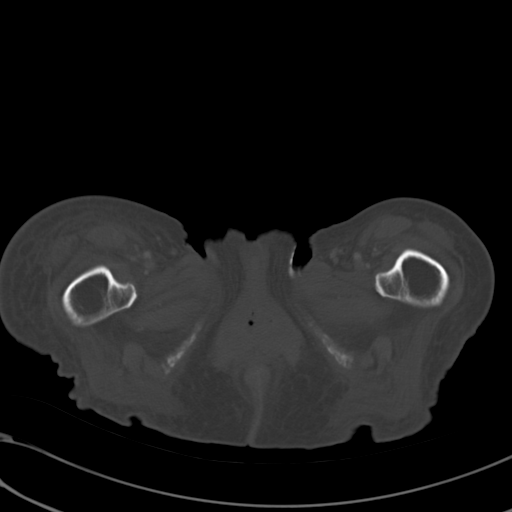
[im 13/79  soft-tissue]
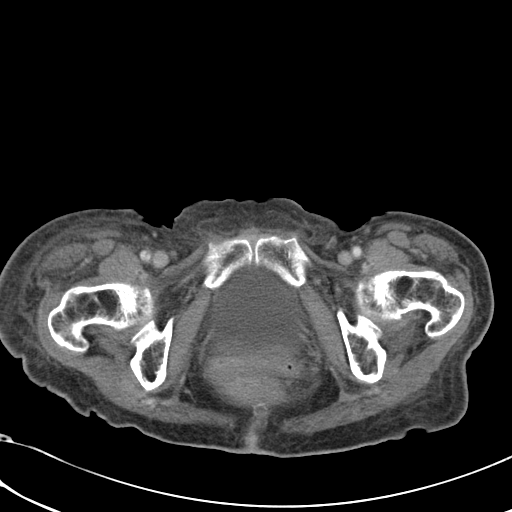
[im 17/79  soft-tissue]
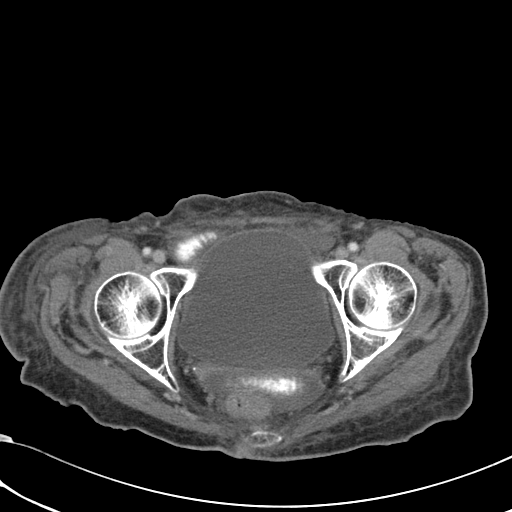
[im 25/79  soft-tissue]
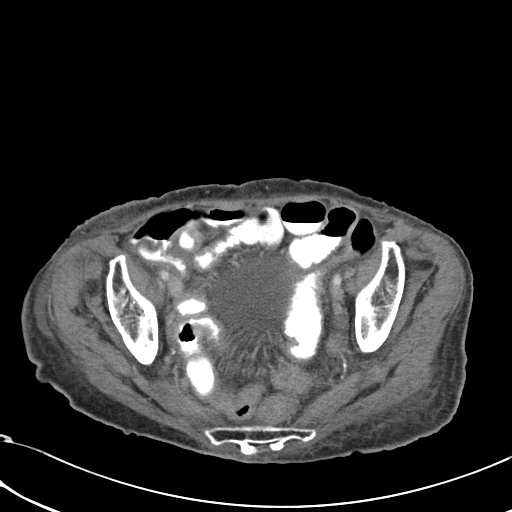
[im 29/79  soft-tissue]
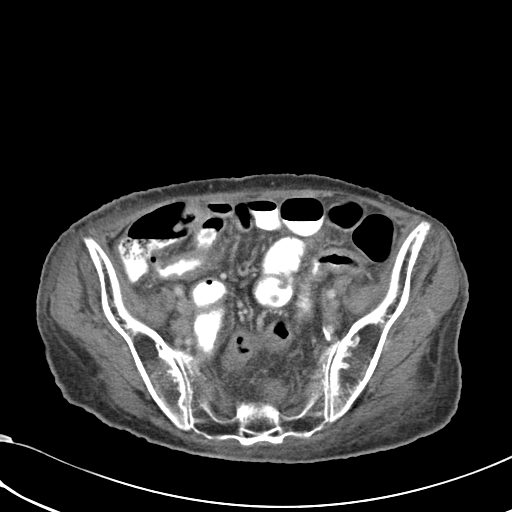
[im 37/79  soft-tissue]
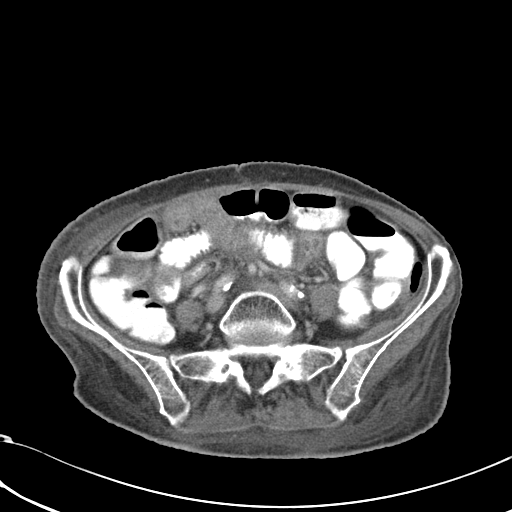
[im 42/79  soft-tissue]
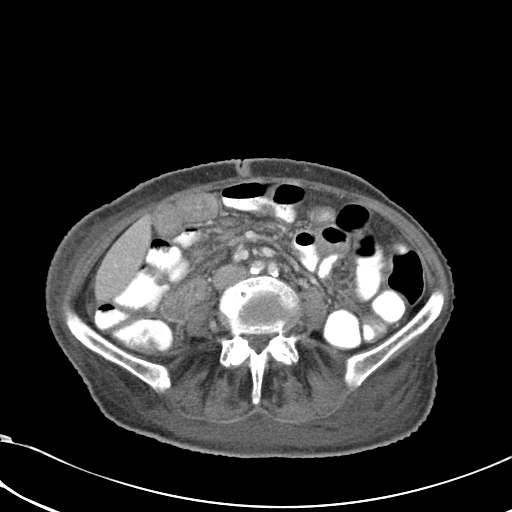
[im 50/79  soft-tissue]
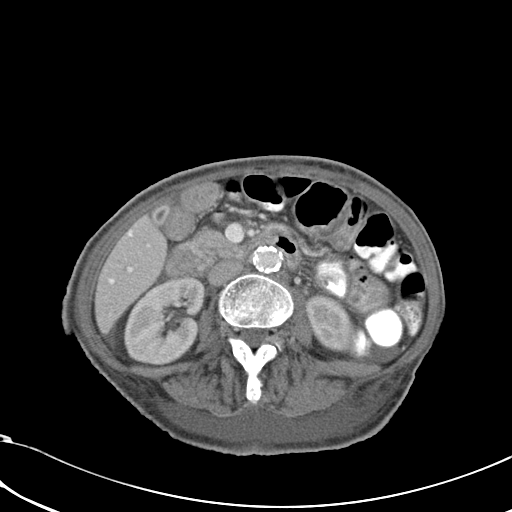
[im 54/79  soft-tissue]
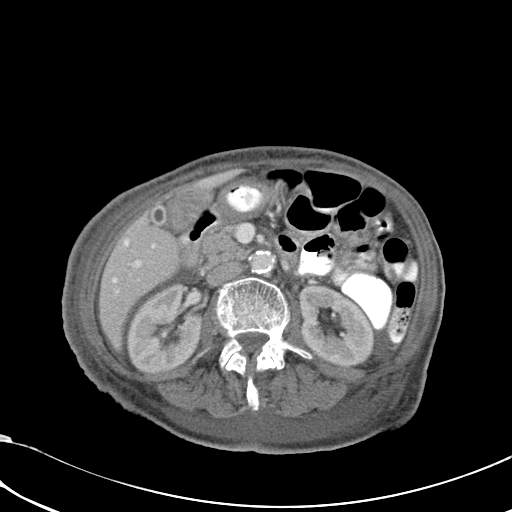
[im 54/79  bone]
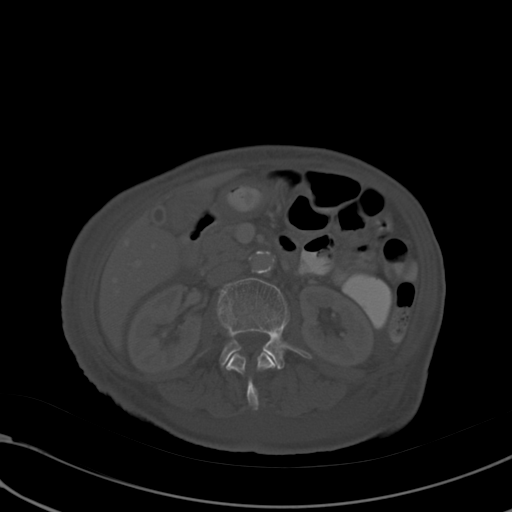
[im 62/79  soft-tissue]
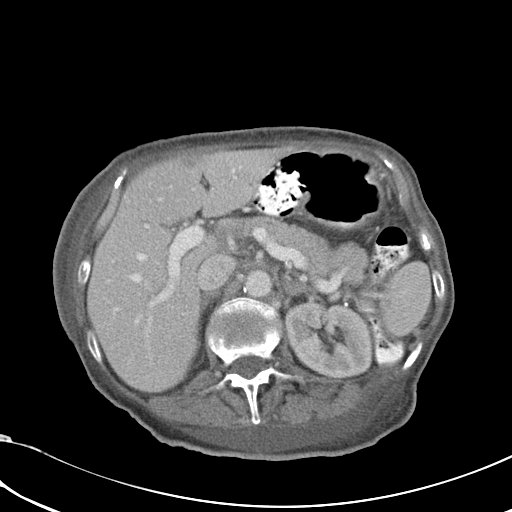
[im 66/79  soft-tissue]
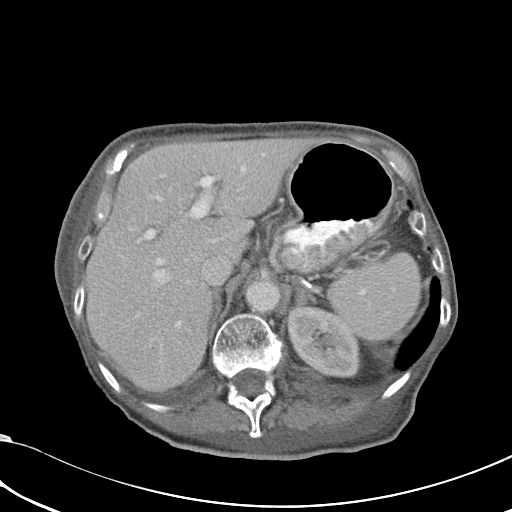
[im 74/79  soft-tissue]
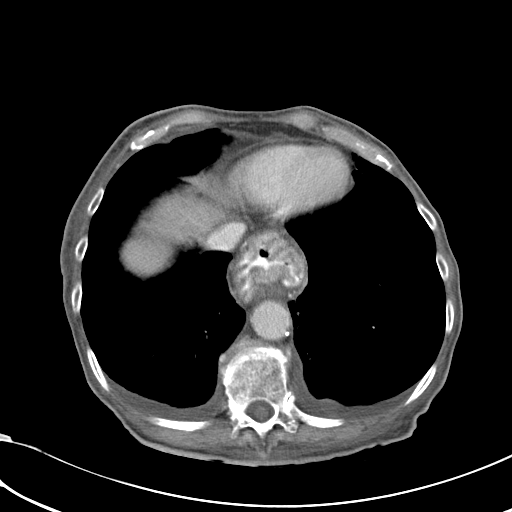

[Series 602: cor · coronal · 0.77mm/px · 3 of 77 slices shown]
[im 26/77  soft-tissue]
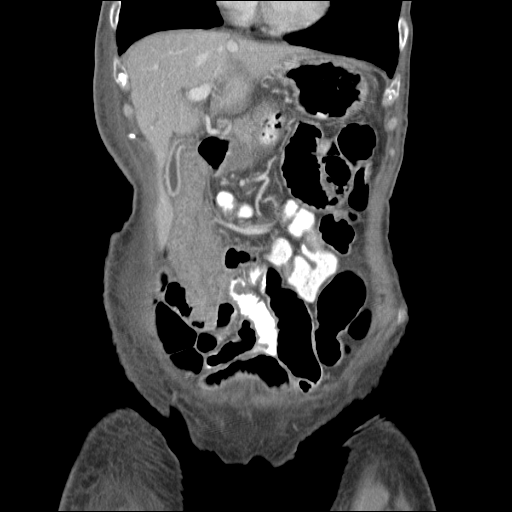
[im 34/77  soft-tissue]
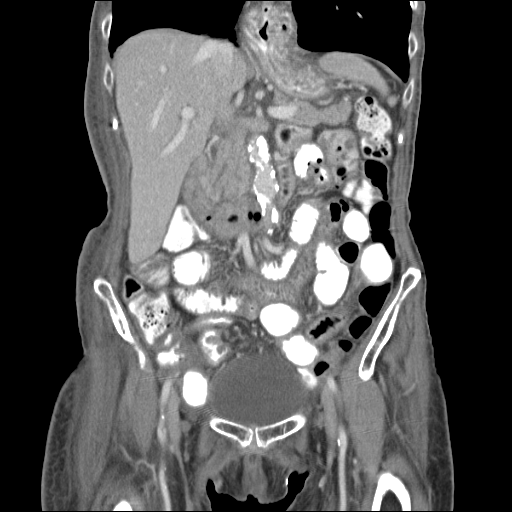
[im 43/77  soft-tissue]
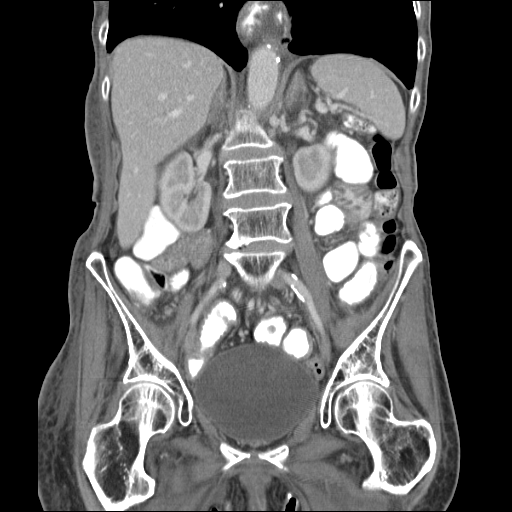

[15 of 46 positions shown; findings below may reference images not displayed]

FINDINGS: Small bilateral pleural effusions are appreciated. Centrilobular
emphysematous changes are appreciated as well as thickening of the
interstitial markings. A very vague ground-glass background is
appreciated within the lung bases. A moderate to large hiatal hernia
is appreciated.

The very small low attenuating focus within the anterior dome of the
right lobe of the liver described on previous study is not clearly
appreciated on the present study. A stable small ill-defined low
attenuating focus projects adjacent to the falciform ligament on the
right. Best seen image 19 series 2. Statistically this likely
reflects an area of focal fatty infiltration. Surveillance
evaluation of this finding on subsequent imaging is recommended. A
stable small calcified granuloma projects in the posterior aspect
the right lobe liver image 16 series 2. The liver otherwise appears
to be unremarkable.

The spleen, right adrenal, pancreas are unremarkable. The right
kidney is unremarkable. An extrarenal pelvis identified within the
left kidney which is otherwise unremarkable. The left adrenal
demonstrates a nodular appearance still maintains an adreniform
shape. A more focal nodular component is identified within the
posterior medial limb of the adrenal image 16 series 2 demonstrating
Hounsfield units of 58. This finding is indeterminate differential
considerations are a small adrenal adenoma.

Atherosclerotic calcifications identified within the aorta. There is
no evidence of abdominal aortic aneurysm. The celiac, SMA, IMA,
portal vein, SMV are opacified.

There is no evidence of bowel obstruction. There are multiple areas
of bowel wall thickening within scattered loops of small bowel. No
associated drainable loculated fluid collections are appreciated.
There is a small amount of free fluid within the pelvis. The
loculated collection adjacent to the anus has decreased in size when
compared to the previous study with a decreased air component. The
poorly defined residual of measures approximately 1.6 x 1.2 cm in AP
by transverse dimensions. There is residual bowel wall thickening
within the distal sigmoid and rectal anal portions of the colon.

There is no evidence of abdominal or pelvic adenopathy nor masses.
Subcentimeter lymph nodes are appreciated within the porta hepatis
region and retroperitoneal regions.

There is no evidence of abdominal wall nor inguinal hernia. There is
no evidence of aggressive appearing osseous lesions.

Subcutaneous fluid is appreciated within the soft tissues of the
lower back. There is no evidence of a loculated component. There is
also evidence of infiltration within the subcutaneous fat of the
abdominal wall and pelvic wall regions.
IMPRESSION: 1. Resolving perirectal fluid collection/abscess. Persistent distal
colitis is appreciated. There also findings which reflect either
reactive edema within loops of small bowel versus a concomitant
enteritis. Continued surveillance evaluation recommended.
2. Indeterminate small nodule within the left adrenal this finding
may be further characterized with adrenal protocol MRI.
3. Likely area of focal fatty infiltration adjacent to the falciform
ligament
4. Small to moderate hiatal hernia.
5. Small bilateral effusions as well as emphysematous and
interstitial changes within the lung bases
6. Fluid within the subcutaneous fat of the lower lumbar and upper
pelvic regions posteriorly. Slightly may reflect dependent edema
particularly if the patient is chronically supine. Areas of
infiltration within the subcutaneous fat which may reflect
developing anasarca clinically appropriate.

## 2015-01-11 ENCOUNTER — Ambulatory Visit (HOSPITAL_BASED_OUTPATIENT_CLINIC_OR_DEPARTMENT_OTHER): Payer: Medicare Other | Admitting: Oncology

## 2015-01-11 ENCOUNTER — Telehealth: Payer: Self-pay | Admitting: Oncology

## 2015-01-11 VITALS — BP 162/64 | HR 95 | Temp 98.0°F | Resp 18 | Ht 62.0 in | Wt 116.7 lb

## 2015-01-11 DIAGNOSIS — Z86718 Personal history of other venous thrombosis and embolism: Secondary | ICD-10-CM

## 2015-01-11 DIAGNOSIS — Z85048 Personal history of other malignant neoplasm of rectum, rectosigmoid junction, and anus: Secondary | ICD-10-CM

## 2015-01-11 DIAGNOSIS — C210 Malignant neoplasm of anus, unspecified: Secondary | ICD-10-CM

## 2015-01-11 NOTE — Telephone Encounter (Signed)
Gave and printed appt sched and avs for pt for June 2017 °

## 2015-01-11 NOTE — Progress Notes (Signed)
  [facility] OFFICE PROGRESS NOTE   Diagnosis: Anal cancer  INTERVAL HISTORY:   Ms. Sara Cochran returns as scheduled. She has narrow bowel movements, but no difficulty having a bowel movement. No bleeding. She underwent a sigmoidoscopy by Dr. Collene Mares in July (we do not have the report available today).  Objective:  Vital signs in last 24 hours:  Blood pressure 162/64, pulse 95, temperature 98 F (36.7 C), temperature source Oral, resp. rate 18, height 5\' 2"  (1.575 m), weight 116 lb 11.2 oz (52.935 kg), SpO2 92 %.    HEENT: Neck without mass Lymphatics: No cervical, supraclavicular, axillary, or inguinal nodes Resp: Lungs clear bilaterally, distant breath sounds Cardio: Regular rate and rhythm GI: No hepatomegaly, no mass Vascular: No leg edema Rectal: Radiation skin changes at the perineum. External hemorrhoids. Stricturing at the lower rectum, no mass   Medications: I have reviewed the patient's current medications.  Assessment/Plan: 1. 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 Initiation of radiation and cycle 1 5-FU/mitomycin C. 02/23/2013, cycle 2 03/23/2013. Completed radiation 04/06/2013. 2. CT evidence of a perirectal abscess 01/22/2013. Improved on CT 04/27/2013. No abscess noted on the CT 07/17/2013 3. COPD. 4. History of thrombocytopenia/neutropenia secondary to chemotherapy.  5. History of pain secondary to radiation skin breakdown at the perineum. Resolved. 6. History of weight loss. She is gaining weight. 7. History of diarrhea. Likely secondary to radiation. Stool negative for C. difficile 04/15/2013. Resolved. 8. Hospitalization 04/23/2013 through 05/01/2013 with dehydration and failure to thrive. 9. Right arm DVT 04/27/2013. She completed a 3 month course of Lovenox 10. Apparent anorectal stricture on exam 12/02/2013 11. Flexible sigmoidoscopy 12/21/2013 with a few telangiectasias from previous radiation treatments. Otherwise normal exam  up to 20 cm.   Disposition:  Sara Cochran remains in clinical remission from anal cancer. We will follow-up on the recent sigmoidoscopy by Dr. Collene Mares. She is scheduled to see Dr. Lisbeth Renshaw in February. She will return for an office visit here in June 2017.  Betsy Coder, MD  01/11/2015  12:58 PM

## 2015-01-17 ENCOUNTER — Encounter: Payer: Self-pay | Admitting: *Deleted

## 2015-01-20 DIAGNOSIS — M545 Low back pain: Secondary | ICD-10-CM | POA: Diagnosis not present

## 2015-01-20 DIAGNOSIS — J449 Chronic obstructive pulmonary disease, unspecified: Secondary | ICD-10-CM | POA: Diagnosis not present

## 2015-01-20 DIAGNOSIS — N390 Urinary tract infection, site not specified: Secondary | ICD-10-CM | POA: Diagnosis not present

## 2015-01-20 DIAGNOSIS — F419 Anxiety disorder, unspecified: Secondary | ICD-10-CM | POA: Diagnosis not present

## 2015-02-02 DIAGNOSIS — C189 Malignant neoplasm of colon, unspecified: Secondary | ICD-10-CM | POA: Diagnosis not present

## 2015-02-02 DIAGNOSIS — J449 Chronic obstructive pulmonary disease, unspecified: Secondary | ICD-10-CM | POA: Diagnosis not present

## 2015-02-02 DIAGNOSIS — M545 Low back pain: Secondary | ICD-10-CM | POA: Diagnosis not present

## 2015-02-02 DIAGNOSIS — F419 Anxiety disorder, unspecified: Secondary | ICD-10-CM | POA: Diagnosis not present

## 2015-04-20 ENCOUNTER — Telehealth: Payer: Self-pay | Admitting: *Deleted

## 2015-04-20 DIAGNOSIS — N390 Urinary tract infection, site not specified: Secondary | ICD-10-CM | POA: Diagnosis not present

## 2015-04-20 NOTE — Telephone Encounter (Signed)
CALLED PATIENT TO ASK ABOUT RESCHEDULING FU FOR 04-21-15 DUE TO DR. MOODY BEING IN [place], LVM FOR A RETURN CALL

## 2015-04-21 ENCOUNTER — Ambulatory Visit: Payer: BLUE CROSS/BLUE SHIELD | Admitting: Radiation Oncology

## 2015-04-21 ENCOUNTER — Ambulatory Visit
Admission: RE | Admit: 2015-04-21 | Payer: BLUE CROSS/BLUE SHIELD | Source: Ambulatory Visit | Admitting: Radiation Oncology

## 2015-05-03 ENCOUNTER — Other Ambulatory Visit: Payer: Self-pay | Admitting: Radiation Oncology

## 2015-05-04 ENCOUNTER — Ambulatory Visit
Admission: RE | Admit: 2015-05-04 | Discharge: 2015-05-04 | Disposition: A | Discharge status: Home / Self Care | Payer: Medicare Other | Source: Ambulatory Visit | Attending: Radiation Oncology | Admitting: Radiation Oncology

## 2015-05-04 ENCOUNTER — Encounter: Payer: Self-pay | Admitting: Radiation Oncology

## 2015-05-04 VITALS — BP 174/51 | HR 91 | Temp 97.9°F | Resp 20 | Ht 62.0 in | Wt 112.9 lb

## 2015-05-04 DIAGNOSIS — C210 Malignant neoplasm of anus, unspecified: Secondary | ICD-10-CM

## 2015-05-04 NOTE — Progress Notes (Addendum)
Radiation Oncology         [phone] ________________________________  Name: Sara Cochran MRN: [id number]  Date: 05/04/2015  DOB: Nov 02, 1933  Follow-Up Visit Note  CC: Alonza Bogus, MD  Leighton Ruff, MD  Diagnosis:   Squamous cell carcinoma of the anal canal  Interval Since Last Radiation:  04/06/2013 Site/dose: The patient was treated to the high dose region to a dose of 50.4 gray in 28 fractions. She was treated using a IMRT technique with daily image guidance.  Narrative:  The patient returns today for routine follow-up, and continues to see Dr. Benay Spice as well as Dr. Lisbeth Renshaw during the year. She was last seen by Dr. Benay Spice in October 2016 at which time she was an EDD. She also undergone sigmoidoscopy in July 2016 with Dr. Collene Mares, at which time she did not have any endoscopic evidence of disease.  On review of systems, she states that she is very nervous when she comes for these appointments and states that otherwise her blood pressure is well-controlled except when she's in doctor's offices. She denies any rectal bleeding, constipation or diarrhea. She is not experiencing any difficulty with stenosis of the anal canal and denies having to use any dilators. She denies any unintended weight changes and reports that she varies about 5 pounds but states that she has not had any changes in her usual eating patterns are habits. She denies any chills, fevers, chest pain or shortness of breath. A complete review of systems is obtained and is otherwise negative.  ALLERGIES:  is allergic to aspirin; erythromycin; and niacin and related.  Meds: Current Outpatient Prescriptions  Medication Sig Dispense Refill  . albuterol (PROVENTIL HFA;VENTOLIN HFA) 108 (90 BASE) MCG/ACT inhaler Inhale 2 puffs into the lungs every 6 (six) hours as needed for wheezing or shortness of breath.    . ALPRAZolam (XANAX) 1 MG tablet Take 1 mg by mouth at bedtime as needed for sleep.    Marland Kitchen amLODipine (NORVASC) 5  MG tablet Take 5 mg by mouth daily.    . calcium carbonate (OS-CAL - DOSED IN MG OF ELEMENTAL CALCIUM) 1250 MG tablet Take 1 tablet (500 mg of elemental calcium total) by mouth 2 (two) times daily with a meal. 60 tablet 2  . diphenoxylate-atropine (LOMOTIL) 2.5-0.025 MG per tablet Take 2 tablets by mouth 4 (four) times daily as needed for diarrhea or loose stools. 40 tablet 0  . HYDROcodone-acetaminophen (LORCET 10/650) 10-650 MG per tablet Take 1 tablet by mouth every 6 (six) hours as needed for pain.    Marland Kitchen ipratropium-albuterol (DUONEB) 0.5-2.5 (3) MG/3ML SOLN Take 3 mLs by nebulization every 6 (six) hours as needed (shortness of breath).    Marland Kitchen lisinopril (PRINIVIL,ZESTRIL) 40 MG tablet Take 40 mg by mouth daily.    Marland Kitchen omeprazole (PRILOSEC) 20 MG capsule Take 20 mg by mouth daily.    . pravastatin (PRAVACHOL) 40 MG tablet Take 40 mg by mouth daily.    . meclizine (ANTIVERT) 25 MG tablet Take 25 mg by mouth as needed. Reported on 05/04/2015    . prochlorperazine (COMPAZINE) 5 MG tablet TAKE ONE TABLET BY MOUTH EVERY 6 HOURS AS NEEDED FOR NAUSEA OR VOMITING (Patient not taking: Reported on 05/04/2015) 30 tablet 1   No current facility-administered medications for this encounter.    Physical Findings:  height is 5\' 2"  (1.575 m) and weight is 112 lb 14.4 oz (51.211 kg). Her oral temperature is 97.9 F (36.6 C). Her blood pressure is 174/51 and  her pulse is 91. Her respiration is 20 and oxygen saturation is 90%.   Pain scale 0/10   In general, this is a well-appearing Caucasian female in no acute distress. She is alert and oriented x4 appropriate throughout the examination. Cardiovascular exam reveals a regular rate and rhythm, no clicks rubs or murmurs auscultated. Chest is clear to auscultation bilaterally. No palpable adenopathy is noted of the supraclavicular, cervical or axillary chains. The abdomen is intact with bowel sounds x4 and is soft, non tender, and non distended. No fascial defects or  palpable hepatosplenomegaly is noted. Rectal exam reveals postradiation change of the external skin and there are 2 visible hemorrhoids present without evidence of ulceration or other lesion. Digital rectal exam is negative for induration or nodularity of the distal rectum.   Lab Findings: Lab Results  Component Value Date   WBC 5.7 06/08/2013   HGB 10.1* 06/08/2013   HCT 31.3* 06/08/2013   MCV 97.5 06/08/2013   PLT 333 06/08/2013     Radiographic Findings: No results found.  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 The patient appears to be clinically without evidence of disease.  Plan:  As above the patient appears to be without evidence of disease. I've discussed that we would continue to recommend that she be evaluated twice a year, and since she has been seen by [place] today, we will see her back in approximately 8 months time. She states agreement and understanding and we'll plan to see Dr. Benay Spice back in June of this year. She is given contact information should she have any questions or concerns that arise prior to her next visit.  This document serves as a record of services personally performed by Shona Simpson, PA iIt was created on her behalf by Jenell Milliner, a trained medical scribe. The creation of this record is based on the scribe's personal observations and the provider's statements to them. This document has been checked and approved by the attending provider.     Carola Rhine, [facility], [place]

## 2015-05-04 NOTE — Patient Instructions (Signed)
Contact our office if you have any questions following today's appointment: [phone].  

## 2015-05-04 NOTE — Progress Notes (Signed)
Follow up s/p rad txs  Anal ca  04/26/12-1/19/1 No nausea, appetite good, regular bowel movements,   Energy level poor, pain back has a knot there stated, arthiritic pain, sob  Oxygen level room air 90% 11:03 AM BP 174/51 mmHg  Pulse 91  Temp(Src) 97.9 F (36.6 C) (Oral)  Resp 20  Ht 5\' 2"  (1.575 m)  Wt 112 lb 14.4 oz (51.211 kg)  BMI 20.64 kg/m2  SpO2 90%  Wt Readings from Last 3 Encounters:  05/04/15 112 lb 14.4 oz (51.211 kg)  01/11/15 116 lb 11.2 oz (52.935 kg)  05/13/14 117 lb (53.071 kg)

## 2015-06-01 DIAGNOSIS — N390 Urinary tract infection, site not specified: Secondary | ICD-10-CM | POA: Diagnosis not present

## 2015-06-01 DIAGNOSIS — M545 Low back pain: Secondary | ICD-10-CM | POA: Diagnosis not present

## 2015-06-01 DIAGNOSIS — C189 Malignant neoplasm of colon, unspecified: Secondary | ICD-10-CM | POA: Diagnosis not present

## 2015-06-01 DIAGNOSIS — J449 Chronic obstructive pulmonary disease, unspecified: Secondary | ICD-10-CM | POA: Diagnosis not present

## 2015-06-01 DIAGNOSIS — I10 Essential (primary) hypertension: Secondary | ICD-10-CM | POA: Diagnosis not present

## 2015-09-05 DIAGNOSIS — J449 Chronic obstructive pulmonary disease, unspecified: Secondary | ICD-10-CM | POA: Diagnosis not present

## 2015-09-05 DIAGNOSIS — K210 Gastro-esophageal reflux disease with esophagitis: Secondary | ICD-10-CM | POA: Diagnosis not present

## 2015-09-05 DIAGNOSIS — I10 Essential (primary) hypertension: Secondary | ICD-10-CM | POA: Diagnosis not present

## 2015-09-05 DIAGNOSIS — M545 Low back pain: Secondary | ICD-10-CM | POA: Diagnosis not present

## 2015-09-13 ENCOUNTER — Telehealth: Payer: Self-pay | Admitting: Oncology

## 2015-09-13 ENCOUNTER — Ambulatory Visit (HOSPITAL_BASED_OUTPATIENT_CLINIC_OR_DEPARTMENT_OTHER): Payer: Medicare Other | Admitting: Nurse Practitioner

## 2015-09-13 VITALS — BP 133/49 | HR 85 | Temp 98.4°F | Resp 16 | Ht 62.0 in | Wt 106.1 lb

## 2015-09-13 DIAGNOSIS — C210 Malignant neoplasm of anus, unspecified: Secondary | ICD-10-CM

## 2015-09-13 NOTE — Telephone Encounter (Signed)
Gave pt cal & avs °

## 2015-09-13 NOTE — Progress Notes (Signed)
  [facility] OFFICE PROGRESS NOTE   Diagnosis:  Anal cancer  INTERVAL HISTORY:   Sara Cochran returns as scheduled. Bowels overall moving regularly. She has periodic loose stools. She occasionally sees a small amount of blood on the toilet tissue after a bowel movement. No pain with bowel movements. She has lost some weight since her last visit. She was recently prescribed Remeron. Due to the side effect profile she has not started the Remeron. She has taken Marinol in the past for appetite and would like to have this prescribed again.  Objective:  Vital signs in last 24 hours:  Blood pressure 133/49, pulse 85, temperature 98.4 F (36.9 C), temperature source Oral, resp. rate 16, height 5\' 2"  (1.575 m), weight 106 lb 1.6 oz (48.127 kg), SpO2 93 %.    HEENT: No thrush or ulcers. Lymphatics: No palpable cervical, supra clavicular, axillary or inguinal lymph nodes. Resp: Lungs clear bilaterally. Cardio: Regular rate and rhythm. GI: Abdomen soft and nontender. No hepatomegaly. No mass. Vascular: No leg edema. Rectal: Radiation skin change involving the perianal skin/perineum. External hemorrhoids. No rectal mass or nodularity.   Lab Results:  Lab Results  Component Value Date   WBC 5.7 06/08/2013   HGB 10.1* 06/08/2013   HCT 31.3* 06/08/2013   MCV 97.5 06/08/2013   PLT 333 06/08/2013   NEUTROABS 4.5 06/08/2013    Imaging:  No results found.  Medications: I have reviewed the patient's current medications.  Assessment/Plan: 1. 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 Initiation of radiation and cycle 1 5-FU/mitomycin C. 02/23/2013, cycle 2 03/23/2013. Completed radiation 04/06/2013. 2. CT evidence of a perirectal abscess 01/22/2013. Improved on CT 04/27/2013. No abscess noted on the CT 07/17/2013 3. COPD. 4. History of thrombocytopenia/neutropenia secondary to chemotherapy.  5. History of pain secondary to radiation skin breakdown at the  perineum. Resolved. 6. History of weight loss.  7. History of diarrhea. Likely secondary to radiation. Stool negative for C. difficile 04/15/2013. Resolved. 8. Hospitalization 04/23/2013 through 05/01/2013 with dehydration and failure to thrive. 9. Right arm DVT 04/27/2013. Shecompleted a 3 month course of Lovenox 10. Apparent anorectal stricture on exam 12/02/2013 11. Flexible sigmoidoscopy 12/21/2013 with a few telangiectasias from previous radiation treatments. Otherwise normal exam up to 20 cm.   Disposition:Sara Cochran remains in clinical remission from anal cancer. She is scheduled to return for a follow-up visit with Dr. Lisbeth Renshaw in October of this year. We will see her back in April 2018 and try to get her on a 6 month rotating schedule with [place] and radiation oncology.  With regard to the anorexia/weight loss she will continue to follow-up with Dr. Luan Pulling.  Plan reviewed with Dr. Benay Spice.    Ned Card ANP/GNP-BC   09/13/2015  1:48 PM

## 2015-12-06 DIAGNOSIS — M545 Low back pain: Secondary | ICD-10-CM | POA: Diagnosis not present

## 2015-12-06 DIAGNOSIS — I10 Essential (primary) hypertension: Secondary | ICD-10-CM | POA: Diagnosis not present

## 2015-12-06 DIAGNOSIS — C189 Malignant neoplasm of colon, unspecified: Secondary | ICD-10-CM | POA: Diagnosis not present

## 2015-12-06 DIAGNOSIS — J441 Chronic obstructive pulmonary disease with (acute) exacerbation: Secondary | ICD-10-CM | POA: Diagnosis not present

## 2015-12-06 DIAGNOSIS — Z23 Encounter for immunization: Secondary | ICD-10-CM | POA: Diagnosis not present

## 2016-01-04 ENCOUNTER — Ambulatory Visit
Admission: RE | Admit: 2016-01-04 | Discharge: 2016-01-04 | Disposition: A | Discharge status: Home / Self Care | Payer: Medicare Other | Source: Ambulatory Visit | Attending: Radiation Oncology | Admitting: Radiation Oncology

## 2016-01-04 ENCOUNTER — Encounter: Payer: Self-pay | Admitting: Radiation Oncology

## 2016-01-04 DIAGNOSIS — C210 Malignant neoplasm of anus, unspecified: Secondary | ICD-10-CM | POA: Diagnosis not present

## 2016-01-04 DIAGNOSIS — Z08 Encounter for follow-up examination after completed treatment for malignant neoplasm: Secondary | ICD-10-CM | POA: Diagnosis not present

## 2016-01-04 DIAGNOSIS — Z87891 Personal history of nicotine dependence: Secondary | ICD-10-CM | POA: Diagnosis not present

## 2016-01-04 DIAGNOSIS — C211 Malignant neoplasm of anal canal: Secondary | ICD-10-CM | POA: Diagnosis not present

## 2016-01-04 NOTE — Progress Notes (Signed)
Sara Cochran has here for received XRT to her anal region.  She reports occasional periods of diarrhea, but none at this time.  Denies any rectal pain. Voiding and emptying bladder completely.   She also reports that her "energy level is better".  Currently on Marinol to facilitate weight gain.  Accompanied by her daughter.  BP (!) 157/67 (BP Location: Left Arm, Patient Position: Sitting, Cuff Size: Normal)   Pulse 79   Temp 98 F (36.7 C)   Resp 16   Ht 5\' 2"  (1.575 m)   Wt 107 lb 6.4 oz (48.7 kg)   SpO2 94%   BMI 19.64 kg/m    Wt Readings from Last 3 Encounters:  01/04/16 107 lb 6.4 oz (48.7 kg)  09/13/15 106 lb 1.6 oz (48.1 kg)  05/04/15 112 lb 14.4 oz (51.2 kg)

## 2016-01-04 NOTE — Progress Notes (Signed)
Ms  

## 2016-01-04 NOTE — Progress Notes (Signed)
Radiation Oncology         ([phone]) [phone] ________________________________  Name: Sara Cochran MRN: [id number]  Date: 01/04/2016  DOB: [birth year]  Follow-Up Visit Note  CC: Alonza Bogus, MD  Leighton Ruff, MD  Diagnosis:   Squamous cell carcinoma of the anal canal  Interval Since Last Radiation: 2 years and 9 months  02/23/2013 through 04/06/2013: The patient was treated to the high dose region to a dose of 50.4 gray in 28 fractions. She was treated using a IMRT technique with daily image guidance.  Narrative:  The patient returns today for routine follow-up, and continues to see Dr. Benay Spice as well as Dr. Lisbeth Renshaw during the year. Her course has previously been outlined. She was last seen by Dr. Benay Spice in October 2016 at which time there was NED. She also undergone sigmoidoscopy in July 2016 with Dr. Collene Mares, at which time she did not have any endoscopic evidence of disease. The patient saw Ned Card, NP on 09/13/15 and had a normal physical exam.  On review of systems, she reports occasional periods of diarrhea, but none at this time. Denies any rectal pain. Voiding and emptying bladder completely. She also reports that her energy level is better. Currently on Marinol to facilitate weight gain. Accompanied by her daughter. A complete review of systems is obtained and is otherwise negative.  Wt Readings from Last 3 Encounters:  01/04/16 107 lb 6.4 oz (48.7 kg)  09/13/15 106 lb 1.6 oz (48.1 kg)  05/04/15 112 lb 14.4 oz (51.2 kg)   Past Medical History:  Past Medical History:  Diagnosis Date  . Acid reflux   . Allergy   . Back pain   . Cancer ([place]) 01/21/13 bx    rectal cancer=invasive squamous cell ca  . COPD (chronic obstructive pulmonary disease) ([place])   . High cholesterol   . Hx of radiation therapy 02/23/13-04/06/13    rectal 50.4Gy/72fx  . Hypertension   . Migraine   . Perirectal abscess   . Weight loss    1 year 25 lbs    Past Surgical History: Past Surgical  History:  Procedure Laterality Date  . ABDOMINAL HYSTERECTOMY    . CESAREAN SECTION    . COLONOSCOPY W/ POLYPECTOMY  01/23/2001   polypoid colonic mucosa,no adenomatous change or malignancy identified  . CYST REMOVAL HAND     3 removed from right hand  . RECTAL BIOPSY  01/21/2013   invasive squamous cell ca,mod to poorly differentiatiated    Social History:  Social History   Social History  . Marital status: Widowed    Spouse name: N/A  . Number of children: N/A  . Years of education: N/A   Occupational History  . Not on file.   Social History Main Topics  . Smoking status: Former Smoker    Quit date: 05/16/1996  . Smokeless tobacco: Never Used  . Alcohol use No  . Drug use: No  . Sexual activity: No   Other Topics Concern  . Not on file   Social History Narrative   Widowed since Nov 2013   Lives with her son, Rosine Abe & his family   Independent ADL's-drives   Stepdaughter-Marissa is close to her    Family History: Family History  Problem Relation Age of Onset  . Heart disease Mother   . Stroke Mother   . Cancer Sister     brain     ALLERGIES:  is allergic to aspirin; erythromycin; and niacin and related.  Meds:  Current Outpatient Prescriptions  Medication Sig Dispense Refill  . albuterol (PROVENTIL HFA;VENTOLIN HFA) 108 (90 BASE) MCG/ACT inhaler Inhale 2 puffs into the lungs every 6 (six) hours as needed for wheezing or shortness of breath.    . ALPRAZolam (XANAX) 1 MG tablet Take 1 mg by mouth at bedtime as needed for sleep.    Marland Kitchen amLODipine (NORVASC) 5 MG tablet Take 5 mg by mouth daily.    . diphenoxylate-atropine (LOMOTIL) 2.5-0.025 MG per tablet Take 2 tablets by mouth 4 (four) times daily as needed for diarrhea or loose stools. 40 tablet 0  . dronabinol (MARINOL) 5 MG capsule Take 5 mg by mouth 2 (two) times daily before a meal.    . HYDROcodone-acetaminophen (LORCET 10/650) 10-650 MG per tablet Take 1 tablet by mouth every 6 (six) hours as  needed for pain.    Marland Kitchen ipratropium-albuterol (DUONEB) 0.5-2.5 (3) MG/3ML SOLN Take 3 mLs by nebulization every 6 (six) hours as needed (shortness of breath).    Marland Kitchen lisinopril (PRINIVIL,ZESTRIL) 40 MG tablet Take 40 mg by mouth daily.    Marland Kitchen omeprazole (PRILOSEC) 20 MG capsule Take 20 mg by mouth daily.    . pravastatin (PRAVACHOL) 40 MG tablet Take 40 mg by mouth daily.    . meclizine (ANTIVERT) 25 MG tablet Take 25 mg by mouth as needed. Reported on 09/13/2015    . prochlorperazine (COMPAZINE) 5 MG tablet TAKE ONE TABLET BY MOUTH EVERY 6 HOURS AS NEEDED FOR NAUSEA OR VOMITING (Patient not taking: Reported on 01/04/2016) 30 tablet 1   No current facility-administered medications for this encounter.     Physical Findings:  height is 5\' 2"  (1.575 m) and weight is 107 lb 6.4 oz (48.7 kg). Her temperature is 98 F (36.7 C). Her blood pressure is 157/67 (abnormal) and her pulse is 79. Her respiration is 16 and oxygen saturation is 94%.   Pain scale 0/10   In general, this is a well-appearing Caucasian female in no acute distress. She is alert and oriented x4 appropriate throughout the examination. Cardiovascular exam reveals a regular rate and rhythm, no clicks rubs or murmurs auscultated. Chest is clear to auscultation bilaterally. Bilateral breast exam does not reveal palpable masses or notable skin or nipple changes of either breast. No palpable adenopathy is noted of the supraclavicular or axillary chains. Palpable left deep chain cervical adenopathy about 66mm on the left neck. The abdomen is intact with bowel sounds x4 and is soft, non tender, and non distended. No fascial defects or palpable hepatosplenomegaly is noted. Pelvic exam reveals normal appearing external female genitalia. No lesions are seen grossly of the labial, perineal, or perianal tissue. Hyperemic change is noted consistent with prior radiotherapy. There are 2 visible hemorrhoids present without evidence of ulceration or other lesion.  Speculum exam reveals a normal appearing vaginal cuff. Bimanual exam reveals smooth cuff without induration or nodularity and no palpable abnormalities are noted of the rectovaginal septum on rectovaginal exam. Rectal tone is intact.  Lab Findings: Lab Results  Component Value Date   WBC 5.7 06/08/2013   HGB 10.1 (L) 06/08/2013   HCT 31.3 (L) 06/08/2013   MCV 97.5 06/08/2013   PLT 333 06/08/2013     Radiographic Findings: No results found.  Impression:  1. 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 The patient appears to be clinically without evidence of disease. Per NCCN guidelines, she should continue to have anoscopy  regularly until 3 years post treatment. We will ask Dr. Collene Mares, gastroenterology, to see her again in the next few months. She is scheduled to follow up with Dr. Gearldine Shown office in April 2018, following this, she will resume her visits with me and continue along with surgery as well. She will follow up with me again in radiation oncology in July 2018. 2. Breast cancer screening. The patient is counseled on the rationale for an annual mammogram. We will coordinate this for her as well. 3. Gyn Care. I've offered to continue to perform annual pelvic and breast exams for the patient. She does not need pap smear screening, and I will perform her next evaluation at her visit in July 2018.  ------------------------------------------------  Carola Rhine, PAC  This document serves as a record of services personally performed by Shona Simpson, PA-C. It was created on her behalf by Darcus Austin, a trained medical scribe. The creation of this record is based on the scribe's personal observations and the providers' statements to them. This document has been checked and approved by the attending provider.

## 2016-01-06 ENCOUNTER — Telehealth: Payer: Self-pay | Admitting: *Deleted

## 2016-01-06 ENCOUNTER — Other Ambulatory Visit: Payer: Self-pay | Admitting: Radiation Oncology

## 2016-01-06 DIAGNOSIS — Z1239 Encounter for other screening for malignant neoplasm of breast: Secondary | ICD-10-CM

## 2016-01-06 NOTE — Telephone Encounter (Signed)
CALLED PATIENT TO INFORM OF APPT. WITH DR. MANN ON 01-12-16 - ARRIVAL TIME - 1:45 PM , SPOKE WITH PATIENT AND SHE IS AWARE OF THIS APPT.

## 2016-01-12 DIAGNOSIS — K5730 Diverticulosis of large intestine without perforation or abscess without bleeding: Secondary | ICD-10-CM | POA: Diagnosis not present

## 2016-01-12 DIAGNOSIS — R140 Abdominal distension (gaseous): Secondary | ICD-10-CM | POA: Diagnosis not present

## 2016-01-12 DIAGNOSIS — K625 Hemorrhage of anus and rectum: Secondary | ICD-10-CM | POA: Diagnosis not present

## 2016-02-06 DIAGNOSIS — E46 Unspecified protein-calorie malnutrition: Secondary | ICD-10-CM | POA: Diagnosis not present

## 2016-02-06 DIAGNOSIS — J449 Chronic obstructive pulmonary disease, unspecified: Secondary | ICD-10-CM | POA: Diagnosis not present

## 2016-02-06 DIAGNOSIS — M545 Low back pain: Secondary | ICD-10-CM | POA: Diagnosis not present

## 2016-02-06 DIAGNOSIS — E035 Myxedema coma: Secondary | ICD-10-CM | POA: Diagnosis not present

## 2016-04-07 ENCOUNTER — Other Ambulatory Visit: Payer: Self-pay | Admitting: Nurse Practitioner

## 2016-05-09 DIAGNOSIS — J449 Chronic obstructive pulmonary disease, unspecified: Secondary | ICD-10-CM | POA: Diagnosis not present

## 2016-05-09 DIAGNOSIS — F419 Anxiety disorder, unspecified: Secondary | ICD-10-CM | POA: Diagnosis not present

## 2016-05-09 DIAGNOSIS — I10 Essential (primary) hypertension: Secondary | ICD-10-CM | POA: Diagnosis not present

## 2016-05-09 DIAGNOSIS — M545 Low back pain: Secondary | ICD-10-CM | POA: Diagnosis not present

## 2016-07-12 ENCOUNTER — Ambulatory Visit (HOSPITAL_BASED_OUTPATIENT_CLINIC_OR_DEPARTMENT_OTHER): Payer: Medicare Other | Admitting: Oncology

## 2016-07-12 ENCOUNTER — Telehealth: Payer: Self-pay | Admitting: Oncology

## 2016-07-12 VITALS — BP 186/72 | HR 93 | Temp 98.6°F | Resp 17 | Ht 62.0 in | Wt 107.6 lb

## 2016-07-12 DIAGNOSIS — C210 Malignant neoplasm of anus, unspecified: Secondary | ICD-10-CM

## 2016-07-12 DIAGNOSIS — Z85048 Personal history of other malignant neoplasm of rectum, rectosigmoid junction, and anus: Secondary | ICD-10-CM | POA: Diagnosis not present

## 2016-07-12 NOTE — Progress Notes (Signed)
  Sara Cochran   Diagnosis: Anal cancer  INTERVAL HISTORY:   Sara Cochran returns as scheduled. She reports no rectal pain. She continues to have rectal urgency. Bowel movements are thin. She had an anoscopy by Dr. Collene Mares in October 2017 and reports some bleeding after the procedure. She recent had a "cold ".  Objective:  Vital signs in last 24 hours:  Blood pressure (!) 186/72, pulse 93, temperature 98.6 F (37 C), temperature source Oral, resp. rate 17, height 5\' 2"  (1.575 m), weight 107 lb 9.6 oz (48.8 kg), SpO2 91 %.    HEENT: Neck without mass Lymphatics: Pea-sized left scalene/low cervical node, no other cervical, supraclavicular, axillary, or inguinal nodes Resp: Distant breath sounds, no respiratory distress Cardio: Regular rate and rhythm GI: No hepatosplenomegaly, no mass, nontender Vascular: No leg edema Rectal: Small soft hemorrhoids at the anal verge, normal tone, stricture at approximately 3 cm from the anal verge with slight associated nodularity, no discrete mass. No blood on the examination glove.      Medications: I have reviewed the patient's current medications.  Assessment/Plan: 1. 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 Initiation of radiation and cycle 1 5-FU/mitomycin C. 02/23/2013, cycle 2 03/23/2013. Completed radiation 04/06/2013. 2. CT evidence of a perirectal abscess 01/22/2013. Improved on CT 04/27/2013. No abscess noted on the CT 07/17/2013 3. COPD. 4. History of thrombocytopenia/neutropenia secondary to chemotherapy.  5. History of pain secondary to radiation skin breakdown at the perineum. Resolved. 6. History of weight loss.  7. History of diarrhea. Likely secondary to radiation. Stool negative for C. difficile 04/15/2013. Resolved. 8. Hospitalization 04/23/2013 through 05/01/2013 with dehydration and failure to thrive. 9. Right arm DVT 04/27/2013. Shecompleted a 3 month course of  Lovenox 10. Apparent anorectal stricture on exam 12/02/2013 11. Flexible sigmoidoscopy 12/21/2013 with a few telangiectasias from previous radiation treatments. Otherwise normal exam up to 20 cm.  Sigmoidoscopy 10/06/2014-mild radiation proctitis     Disposition:  Sara Cochran remains in clinical remission from anal cancer. She appears to have a radiation stricture at the upper anal canal. We will follow-up on the anoscopy report from Dr. Collene Mares.  Sara Cochran is scheduled to see radiation oncology in July. She will return for an office visit in one year.  Betsy Coder, MD  07/12/2016  1:13 PM

## 2016-07-12 NOTE — Telephone Encounter (Signed)
Gave patient AVS and calender per 4/26 los. 7yr f/u

## 2016-08-08 DIAGNOSIS — I10 Essential (primary) hypertension: Secondary | ICD-10-CM | POA: Diagnosis not present

## 2016-08-08 DIAGNOSIS — M545 Low back pain: Secondary | ICD-10-CM | POA: Diagnosis not present

## 2016-08-08 DIAGNOSIS — C189 Malignant neoplasm of colon, unspecified: Secondary | ICD-10-CM | POA: Diagnosis not present

## 2016-08-08 DIAGNOSIS — J449 Chronic obstructive pulmonary disease, unspecified: Secondary | ICD-10-CM | POA: Diagnosis not present

## 2016-09-26 NOTE — Progress Notes (Addendum)
Sara Cochran 83 y.o. woman with squamous cell carcinoma of the anal canal radiation comleted ,FU  Pain:Denies pain this morning. Nausea/ Vomiting:Denies pain this morning. Diarrhea:Yes,taking Lomotil as needed.  Has a bowel movement daily in the mornings. Skin irritation:None Vaginal/Rectal bleeding:None Fatigue:Reports feeling tired all the time. Loss of appetite:Fair appetite eats two to one meal a day. Using O2 2 L/M nasal cannular at night as needed. 07-12-16 Saw Dr. Benay Spice [name] remains in clinical remission from anal cancer. She appears to have a radiation stricture at the upper anal canal. We will follow-up on the anoscopy report from Dr. Collene Mares.  [name] is scheduled to see radiation oncology in July. She will return for an office visit in one year Weight: Wt Readings from Last 3 Encounters:  10/02/16 105 lb 3.2 oz (47.7 kg)  07/12/16 107 lb 9.6 oz (48.8 kg)  01/04/16 107 lb 6.4 oz (48.7 kg)  BP (!) 159/59   Pulse 87   Temp 98.1 F (36.7 C) (Oral)   Resp 16   Ht 5\' 2"  (1.575 m)   Wt 105 lb 3.2 oz (47.7 kg)   SpO2 95%   BMI 19.24 kg/m

## 2016-10-01 ENCOUNTER — Other Ambulatory Visit: Payer: Self-pay | Admitting: Radiation Oncology

## 2016-10-01 DIAGNOSIS — Z1231 Encounter for screening mammogram for malignant neoplasm of breast: Secondary | ICD-10-CM

## 2016-10-02 ENCOUNTER — Ambulatory Visit
Admission: RE | Admit: 2016-10-02 | Discharge: 2016-10-02 | Disposition: A | Discharge status: Home / Self Care | Payer: Medicare Other | Source: Ambulatory Visit | Attending: Radiation Oncology | Admitting: Radiation Oncology

## 2016-10-02 ENCOUNTER — Encounter: Payer: Self-pay | Admitting: Radiation Oncology

## 2016-10-02 VITALS — BP 159/59 | HR 87 | Temp 98.1°F | Resp 16 | Ht 62.0 in | Wt 105.2 lb

## 2016-10-02 DIAGNOSIS — C211 Malignant neoplasm of anal canal: Secondary | ICD-10-CM | POA: Insufficient documentation

## 2016-10-02 DIAGNOSIS — G43909 Migraine, unspecified, not intractable, without status migrainosus: Secondary | ICD-10-CM | POA: Diagnosis not present

## 2016-10-02 DIAGNOSIS — R140 Abdominal distension (gaseous): Secondary | ICD-10-CM | POA: Diagnosis not present

## 2016-10-02 DIAGNOSIS — Z08 Encounter for follow-up examination after completed treatment for malignant neoplasm: Secondary | ICD-10-CM | POA: Diagnosis not present

## 2016-10-02 DIAGNOSIS — K219 Gastro-esophageal reflux disease without esophagitis: Secondary | ICD-10-CM | POA: Insufficient documentation

## 2016-10-02 DIAGNOSIS — J449 Chronic obstructive pulmonary disease, unspecified: Secondary | ICD-10-CM | POA: Insufficient documentation

## 2016-10-02 DIAGNOSIS — Z87891 Personal history of nicotine dependence: Secondary | ICD-10-CM | POA: Diagnosis not present

## 2016-10-02 DIAGNOSIS — R197 Diarrhea, unspecified: Secondary | ICD-10-CM | POA: Diagnosis not present

## 2016-10-02 DIAGNOSIS — Z1239 Encounter for other screening for malignant neoplasm of breast: Secondary | ICD-10-CM

## 2016-10-02 DIAGNOSIS — C210 Malignant neoplasm of anus, unspecified: Secondary | ICD-10-CM

## 2016-10-02 DIAGNOSIS — E7800 Pure hypercholesterolemia, unspecified: Secondary | ICD-10-CM | POA: Insufficient documentation

## 2016-10-02 DIAGNOSIS — Z85048 Personal history of other malignant neoplasm of rectum, rectosigmoid junction, and anus: Secondary | ICD-10-CM | POA: Diagnosis not present

## 2016-10-02 NOTE — Progress Notes (Signed)
Radiation Oncology         [phone]) [phone] ________________________________  Name: Sara Cochran MRN: [id number]  Date: 10/02/2016  DOB: [birth year]  Follow-Up Visit Note  CC: Sinda Du, MD    Diagnosis:   Squamous cell carcinoma of the anal canal  Interval Since Last Radiation: 3 years, 6 months  02/23/2013 through 04/06/2013: The patient was treated to the high dose region to a dose of 50.4 gray in 28 fractions. She was treated using a IMRT technique with daily image guidance.  Narrative:  The patient returns today for routine follow-up, and continues to see Dr. Benay Spice as well. She was seen by Dr. Benay Spice in April 2018 and felt to be NED clinically by exam. In brief she was treated for what appears to be locally confined squamous cell carcinoma of the distal rectum/proximal anus she completed radiation therapy with chemosensitization in January 2015 and has been NED since. She underwent sigmoidoscopy in July 2016 with Dr. Collene Mares, at which time she did not have any endoscopic evidence of disease. She has been followed clinically since, and was last seen in our office in October 2017.   On review of systems, she reports occasional episodes of diarrhea that are controlled with oral Lomotil. The patient is concerned about being out of the house and eating as a result of previous accidents due to diarrhea. She reports that she has thin stools, and some stenosis that does not require dilation. She denies any rectal bleeding with bowel movements, and has movements daily. Denies any rectal pain. She denies any abdominal pain, nausea, vomiting, or bladder dysfunction. She does have gas he is taking over-the-counter supplements and probiotic striate help this however this has not improved significantly. She plans to talk to Dr. Collene Mares about this further. She denies any fatigue, weight loss, shortness of breath or chest pain, skin changes, new onset of musculoskeletal aches or pains. A complete review of  systems is obtained and is otherwise negative  Past Medical History:  Past Medical History:  Diagnosis Date  . Acid reflux   . Allergy   . Back pain   . Cancer ([place]) 01/21/13 bx    rectal cancer=invasive squamous cell ca  . COPD (chronic obstructive pulmonary disease) ([place])   . High cholesterol   . Hx of radiation therapy 02/23/13-04/06/13    rectal 50.4Gy/32fx  . Hypertension   . Migraine   . Perirectal abscess   . Weight loss    1 year 25 lbs    Past Surgical History: Past Surgical History:  Procedure Laterality Date  . ABDOMINAL HYSTERECTOMY    . CESAREAN SECTION    . COLONOSCOPY W/ POLYPECTOMY  01/23/2001   polypoid colonic mucosa,no adenomatous change or malignancy identified  . CYST REMOVAL HAND     3 removed from right hand  . RECTAL BIOPSY  01/21/2013   invasive squamous cell ca,mod to poorly differentiatiated    Social History:  Social History   Social History  . Marital status: Widowed    Spouse name: N/A  . Number of children: N/A  . Years of education: N/A   Occupational History  . Not on file.   Social History Main Topics  . Smoking status: Former Smoker    Quit date: 05/16/1996  . Smokeless tobacco: Never Used  . Alcohol use No  . Drug use: No  . Sexual activity: No   Other Topics Concern  . Not on file   Social History Narrative   Widowed  since Nov 2013   Lives with her son, Gene Philipp Ovens & his family   Independent ADL's-drives   Stepdaughter-Marissa is close to her    Family History: Family History  Problem Relation Age of Onset  . Heart disease Mother   . Stroke Mother   . Cancer Sister        brain     ALLERGIES:  is allergic to aspirin; erythromycin; and niacin and related.  Meds: Current Outpatient Prescriptions  Medication Sig Dispense Refill  . ALPRAZolam (XANAX) 1 MG tablet Take 1 mg by mouth at bedtime as needed for sleep.    Marland Kitchen amLODipine (NORVASC) 5 MG tablet Take 5 mg by mouth daily.    . diphenoxylate-atropine  (LOMOTIL) 2.5-0.025 MG per tablet Take 2 tablets by mouth 4 (four) times daily as needed for diarrhea or loose stools. 40 tablet 0  . HYDROcodone-acetaminophen (NORCO) 10-325 MG tablet Take 1 tablet by mouth every 6 (six) hours as needed.    Marland Kitchen ipratropium-albuterol (DUONEB) 0.5-2.5 (3) MG/3ML SOLN Take 3 mLs by nebulization every 6 (six) hours as needed (shortness of breath).    Marland Kitchen levothyroxine (SYNTHROID, LEVOTHROID) 50 MCG tablet Take 50 mcg by mouth daily.    Marland Kitchen lisinopril (PRINIVIL,ZESTRIL) 40 MG tablet Take 40 mg by mouth daily.    Marland Kitchen omeprazole (PRILOSEC) 20 MG capsule Take 20 mg by mouth daily.    . pravastatin (PRAVACHOL) 40 MG tablet Take 40 mg by mouth daily.    Marland Kitchen albuterol (PROVENTIL HFA;VENTOLIN HFA) 108 (90 BASE) MCG/ACT inhaler Inhale 2 puffs into the lungs every 6 (six) hours as needed for wheezing or shortness of breath.    . meclizine (ANTIVERT) 25 MG tablet Take 25 mg by mouth as needed. Reported on 09/13/2015    . prochlorperazine (COMPAZINE) 5 MG tablet TAKE ONE TABLET BY MOUTH EVERY 6 HOURS AS NEEDED FOR NAUSEA OR VOMITING (Patient not taking: Reported on 01/04/2016) 30 tablet 1   No current facility-administered medications for this encounter.     Physical Findings:  height is 5\' 2"  (1.575 m) and weight is 105 lb 3.2 oz (47.7 kg). Her oral temperature is 98.1 F (36.7 C). Her blood pressure is 159/59 (abnormal) and her pulse is 87. Her respiration is 16 and oxygen saturation is 95%.   Pain scale 0/10   In general, this is a well-appearing Caucasian female in no acute distress. She is alert and oriented x4 appropriate throughout the examination. Cardiovascular exam reveals a regular rate and rhythm, no clicks rubs or murmurs auscultated. Chest is clear to auscultation bilaterally. Bilateral breast exam does not reveal palpable masses or notable skin or nipple changes of either breast. No palpable adenopathy is noted of the supraclavicular or axillary chains. Palpable left deep  chain cervical adenopathy about 50mm on the left neck is noted and unchanged. The abdomen is intact with bowel sounds x4 and is soft, non tender, and non distended. No fascial defects or palpable hepatosplenomegaly is noted. Pelvic exam reveals normal appearing external female genitalia. No lesions are seen grossly of the labial, perineal, or perianal tissue. Hyperemic change is noted consistent with prior radiotherapy. There are 2 visible hemorrhoids present without evidence of ulceration or other lesion. Speculum exam reveals a normal appearing vaginal cuff. Bimanual exam reveals smooth cuff without induration or nodularity and no palpable abnormalities are noted of the rectovaginal septum on rectovaginal exam. Rectal tone is intact.  Lab Findings: Lab Results  Component Value Date   WBC 5.7 06/08/2013  HGB 10.1 (L) 06/08/2013   HCT 31.3 (L) 06/08/2013   MCV 97.5 06/08/2013   PLT 333 06/08/2013     Radiographic Findings: No results found.  Impression:  1. 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 The patient appears to be clinically without evidence of disease. She will return in September 2019, or sooner if she has questions or concerns for me. She also see Dr. Benay Spice in April 2019 as well. 2. Breast cancer screening. The patient is counseled on the rationale for an annual mammogram. She is not interested in that this time and understands the risks and benefits of screening versus not screening. We will follow this expectantly and perform annual breast exams. 3. Intermittent diarrhea and gassiness. The patient was advised to discuss this further with her  gastroenterologist Dr. Collene Mares. For now she is planning to check with her pharmacist to determine if she is able to use 1/2 of a Lomotil tablet as needed. I will follow this expectantly.   Carola Rhine, PAC

## 2016-10-03 ENCOUNTER — Telehealth: Payer: Self-pay | Admitting: *Deleted

## 2016-10-03 NOTE — Telephone Encounter (Signed)
CALLED PATIENT TO INFORM OF FU APPT. WITH ALISON PERKINS ON 12-04-17 , LVM FOR A RETURN CALL

## 2016-10-08 ENCOUNTER — Ambulatory Visit (HOSPITAL_COMMUNITY): Payer: Medicare Other

## 2016-11-08 DIAGNOSIS — J449 Chronic obstructive pulmonary disease, unspecified: Secondary | ICD-10-CM | POA: Diagnosis not present

## 2016-11-08 DIAGNOSIS — F419 Anxiety disorder, unspecified: Secondary | ICD-10-CM | POA: Diagnosis not present

## 2016-11-08 DIAGNOSIS — M545 Low back pain: Secondary | ICD-10-CM | POA: Diagnosis not present

## 2016-11-08 DIAGNOSIS — I10 Essential (primary) hypertension: Secondary | ICD-10-CM | POA: Diagnosis not present

## 2016-12-19 DIAGNOSIS — Z23 Encounter for immunization: Secondary | ICD-10-CM | POA: Diagnosis not present

## 2017-01-01 DIAGNOSIS — K219 Gastro-esophageal reflux disease without esophagitis: Secondary | ICD-10-CM | POA: Diagnosis not present

## 2017-01-01 DIAGNOSIS — R152 Fecal urgency: Secondary | ICD-10-CM | POA: Diagnosis not present

## 2017-01-01 DIAGNOSIS — K582 Mixed irritable bowel syndrome: Secondary | ICD-10-CM | POA: Diagnosis not present

## 2017-01-01 DIAGNOSIS — Z85048 Personal history of other malignant neoplasm of rectum, rectosigmoid junction, and anus: Secondary | ICD-10-CM | POA: Diagnosis not present

## 2017-02-12 DIAGNOSIS — J449 Chronic obstructive pulmonary disease, unspecified: Secondary | ICD-10-CM | POA: Diagnosis not present

## 2017-02-12 DIAGNOSIS — J9611 Chronic respiratory failure with hypoxia: Secondary | ICD-10-CM | POA: Diagnosis not present

## 2017-02-12 DIAGNOSIS — Z8589 Personal history of malignant neoplasm of other organs and systems: Secondary | ICD-10-CM | POA: Diagnosis not present

## 2017-02-12 DIAGNOSIS — M545 Low back pain: Secondary | ICD-10-CM | POA: Diagnosis not present

## 2017-05-16 DIAGNOSIS — E46 Unspecified protein-calorie malnutrition: Secondary | ICD-10-CM | POA: Diagnosis not present

## 2017-05-16 DIAGNOSIS — I10 Essential (primary) hypertension: Secondary | ICD-10-CM | POA: Diagnosis not present

## 2017-05-16 DIAGNOSIS — J449 Chronic obstructive pulmonary disease, unspecified: Secondary | ICD-10-CM | POA: Diagnosis not present

## 2017-05-16 DIAGNOSIS — Z8589 Personal history of malignant neoplasm of other organs and systems: Secondary | ICD-10-CM | POA: Diagnosis not present

## 2017-07-11 ENCOUNTER — Inpatient Hospital Stay: Payer: Medicare Other | Attending: Oncology | Admitting: Oncology

## 2017-07-11 VITALS — BP 169/59 | HR 98 | Temp 98.6°F | Resp 19 | Ht 62.0 in | Wt 104.1 lb

## 2017-07-11 DIAGNOSIS — Z85048 Personal history of other malignant neoplasm of rectum, rectosigmoid junction, and anus: Secondary | ICD-10-CM | POA: Insufficient documentation

## 2017-07-11 DIAGNOSIS — Z923 Personal history of irradiation: Secondary | ICD-10-CM | POA: Diagnosis not present

## 2017-07-11 DIAGNOSIS — C210 Malignant neoplasm of anus, unspecified: Secondary | ICD-10-CM

## 2017-07-11 NOTE — Progress Notes (Signed)
  [facility] OFFICE PROGRESS NOTE   Diagnosis: Anal cancer  INTERVAL HISTORY:   [name] returns as scheduled.  She continues to have irregular bowel habits.  No bleeding.  She reports undergoing an endoscopic evaluation by Dr. Collene Mares in October 2018.   Objective:  Vital signs in last 24 hours:  Blood pressure (!) 169/59, pulse 98, temperature 98.6 F (37 C), temperature source Oral, resp. rate 19, height 5\' 2"  (1.575 m), weight 104 lb 1.6 oz (47.2 kg).    HEENT: Neck without mass Lymphatics: No cervical, supraclavicular, axillary, or inguinal nodes Resp: Distant breath sounds, no respiratory distress Cardio: Regular rate and rhythm GI: No hepatospleno megaly, nontender, no mass Rectal: Radiation changes at the perineum, rectal stricture at the tip of the examination finger, no mass, small external hemorrhoid/skin tags Vascular: Leg edema   Medications: I have reviewed the patient's current medications.   Assessment/Plan: 1. 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 Initiation of radiation and cycle 1 5-FU/mitomycin C. 02/23/2013, cycle 2 03/23/2013. Completed radiation 04/06/2013. 2. CT evidence of a perirectal abscess 01/22/2013. Improved on CT 04/27/2013. No abscess noted on the CT 07/17/2013 3. COPD. 4. History of thrombocytopenia/neutropenia secondary to chemotherapy.  5. History of pain secondary to radiation skin breakdown at the perineum. Resolved. 6. History of weight loss.  7. History of diarrhea. Likely secondary to radiation. Stool negative for C. difficile 04/15/2013. Resolved. 8. Hospitalization 04/23/2013 through 05/01/2013 with dehydration and failure to thrive. 9. Right arm DVT 04/27/2013. Shecompleted a 3 month course of Lovenox 10. Apparent anorectal stricture on exam 12/02/2013 11. Flexible sigmoidoscopy 12/21/2013 with a few telangiectasias from previous radiation treatments. Otherwise normal exam up to 20  cm.  Sigmoidoscopy 10/06/2014-mild radiation proctitis   Disposition: Sara Cochran remains in clinical remission from anal cancer.  She is now almost 5 years out from diagnosis.  She is scheduled to see radiation oncology in September 2019.  She reports that she will be scheduled to see Dr. Collene Mares in October 2019.  She has a good prognosis for a long-term disease-free survival.  She was discharged from the medical oncology clinic today.  We will see her in the future as needed.  15 minutes were spent with the patient today.  The majority of the time was used for counseling and coordination of care.  Betsy Coder, MD  07/11/2017  1:07 PM

## 2017-08-07 DIAGNOSIS — N390 Urinary tract infection, site not specified: Secondary | ICD-10-CM | POA: Diagnosis not present

## 2017-08-07 DIAGNOSIS — M545 Low back pain: Secondary | ICD-10-CM | POA: Diagnosis not present

## 2017-08-07 DIAGNOSIS — Z8589 Personal history of malignant neoplasm of other organs and systems: Secondary | ICD-10-CM | POA: Diagnosis not present

## 2017-08-07 DIAGNOSIS — I10 Essential (primary) hypertension: Secondary | ICD-10-CM | POA: Diagnosis not present

## 2017-08-07 DIAGNOSIS — J449 Chronic obstructive pulmonary disease, unspecified: Secondary | ICD-10-CM | POA: Diagnosis not present

## 2017-11-07 DIAGNOSIS — I10 Essential (primary) hypertension: Secondary | ICD-10-CM | POA: Diagnosis not present

## 2017-11-07 DIAGNOSIS — M545 Low back pain: Secondary | ICD-10-CM | POA: Diagnosis not present

## 2017-11-07 DIAGNOSIS — R634 Abnormal weight loss: Secondary | ICD-10-CM | POA: Diagnosis not present

## 2017-11-07 DIAGNOSIS — J441 Chronic obstructive pulmonary disease with (acute) exacerbation: Secondary | ICD-10-CM | POA: Diagnosis not present

## 2017-11-29 NOTE — Progress Notes (Signed)
Sara Cochran 84 y.o. woman with squamous cell carcinoma of the anal canal radiation completed 04-06-13 ,annual FU.  Pain:No Nausea/ Vomiting:No Diarrhea:Yes taking Lomotil Skin irritation:None Vaginal/Rectal bleeding:No Fatigue:Yes Loss of appetite:Altered PCP ordered Marinol 5 mg. She took one dose and it made her drink feeling and light headed.   Using O2 2 L/M nasal cannular at night as needed. Wt Readings from Last 3 Encounters:  12/04/17 101 lb 6.4 oz (46 kg)  07/11/17 104 lb 1.6 oz (47.2 kg)  10/02/16 105 lb 3.2 oz (47.7 kg)  BP (!) 147/63 (Patient Position: Sitting)   Pulse 90   Temp 98.3 F (36.8 C) (Oral)   Resp 18   Ht 5\' 2"  (1.575 m)   Wt 101 lb 6.4 oz (46 kg)   SpO2 95% Comment: prn o@ 2 l/m  BMI 18.55 kg/m

## 2017-12-04 ENCOUNTER — Other Ambulatory Visit: Payer: Self-pay

## 2017-12-04 ENCOUNTER — Ambulatory Visit
Admission: RE | Admit: 2017-12-04 | Discharge: 2017-12-04 | Disposition: A | Discharge status: Home / Self Care | Payer: Medicare Other | Source: Ambulatory Visit | Attending: Radiation Oncology | Admitting: Radiation Oncology

## 2017-12-04 ENCOUNTER — Encounter: Payer: Self-pay | Admitting: Radiation Oncology

## 2017-12-04 VITALS — BP 147/63 | HR 90 | Temp 98.3°F | Resp 18 | Ht 62.0 in | Wt 101.4 lb

## 2017-12-04 DIAGNOSIS — Z87891 Personal history of nicotine dependence: Secondary | ICD-10-CM | POA: Insufficient documentation

## 2017-12-04 DIAGNOSIS — I10 Essential (primary) hypertension: Secondary | ICD-10-CM | POA: Diagnosis not present

## 2017-12-04 DIAGNOSIS — Z79890 Hormone replacement therapy (postmenopausal): Secondary | ICD-10-CM | POA: Insufficient documentation

## 2017-12-04 DIAGNOSIS — C211 Malignant neoplasm of anal canal: Secondary | ICD-10-CM | POA: Diagnosis not present

## 2017-12-04 DIAGNOSIS — J449 Chronic obstructive pulmonary disease, unspecified: Secondary | ICD-10-CM | POA: Diagnosis not present

## 2017-12-04 DIAGNOSIS — C210 Malignant neoplasm of anus, unspecified: Secondary | ICD-10-CM

## 2017-12-04 DIAGNOSIS — Z85048 Personal history of other malignant neoplasm of rectum, rectosigmoid junction, and anus: Secondary | ICD-10-CM | POA: Diagnosis not present

## 2017-12-04 DIAGNOSIS — R197 Diarrhea, unspecified: Secondary | ICD-10-CM | POA: Diagnosis not present

## 2017-12-04 DIAGNOSIS — Z886 Allergy status to analgesic agent status: Secondary | ICD-10-CM | POA: Insufficient documentation

## 2017-12-04 DIAGNOSIS — Z923 Personal history of irradiation: Secondary | ICD-10-CM | POA: Insufficient documentation

## 2017-12-04 DIAGNOSIS — Z79899 Other long term (current) drug therapy: Secondary | ICD-10-CM | POA: Insufficient documentation

## 2017-12-04 DIAGNOSIS — Z881 Allergy status to other antibiotic agents status: Secondary | ICD-10-CM | POA: Diagnosis not present

## 2017-12-04 DIAGNOSIS — Z08 Encounter for follow-up examination after completed treatment for malignant neoplasm: Secondary | ICD-10-CM | POA: Diagnosis not present

## 2017-12-04 DIAGNOSIS — R636 Underweight: Secondary | ICD-10-CM | POA: Diagnosis not present

## 2017-12-04 NOTE — Progress Notes (Signed)
Radiation Oncology         [phone]) [phone] ________________________________  Name: Sara Cochran MRN: [id number]  Date: 12/04/2017  DOB: 1933-07-16  Follow-Up Visit Note  CC: Sinda Du, MD    Diagnosis:   Squamous cell carcinoma of the anal canal  Interval Since Last Radiation: 4 years, 8 months  02/23/2013 through 04/06/2013: The patient was treated to the high dose region to a dose of 50.4 gray in 28 fractions. She was treated using a IMRT technique with daily image guidance.  Narrative:  The patient is a pleasant 84 y.o. woman who was treated in 2015 with chemoRT for squamous cellc carcinoma of the anus. She has done well and has been followed in surveillance. She returns today for routine follow-up, and saw Dr. Benay Spice in April 2019, and was released to routine follow up with myself and Dr .Collene Mares.  She has had sigmoidoscopy in 2017, but it does not appear that this has been performed since. She does not have an appointment with Dr. Collene Mares this year but will be scheduled.  She was last seen in our office a year ago and was doing well clinically with no evidence of disease.   On review of systems, the patient reports that she continues to do pretty well overall. She continues with episodes of diarrhea, and takes IBARD and Lomotil as needed. She also takes Marinol as an appetite stimulant. She states that this has been troublesome as a prescription was recently called in and the strength was doubled. She felt very "loopy and drunk" and has not taken this since. She's contacted her MD's office for a change in the dose to what she had previously been taking. She denies any chest pain, shortness of breath, cough, fevers, chills, night sweats, unintended weight changes. She denies any bladder disturbances, and denies abdominal pain, nausea or vomiting. She denies any new musculoskeletal or joint aches or pains, new skin lesions or concerns. She denies any palpable masses in her breast. A complete  review of systems is obtained and is otherwise negative.  Past Medical History:  Diagnosis Date  . Acid reflux   . Allergy   . Back pain   . Cancer ([place]) 01/21/13 bx    rectal cancer=invasive squamous cell ca  . COPD (chronic obstructive pulmonary disease) ([place])   . High cholesterol   . Hx of radiation therapy 02/23/13-04/06/13    rectal 50.4Gy/20fx  . Hypertension   . Migraine   . Perirectal abscess   . Weight loss    1 year 25 lbs    Past Surgical History: Past Surgical History:  Procedure Laterality Date  . ABDOMINAL HYSTERECTOMY    . CESAREAN SECTION    . COLONOSCOPY W/ POLYPECTOMY  01/23/2001   polypoid colonic mucosa,no adenomatous change or malignancy identified  . CYST REMOVAL HAND     3 removed from right hand  . RECTAL BIOPSY  01/21/2013   invasive squamous cell ca,mod to poorly differentiatiated    Social History:  Social History   Socioeconomic History  . Marital status: Widowed    Spouse name: Not on file  . Number of children: Not on file  . Years of education: Not on file  . Highest education level: Not on file  Occupational History  . Not on file  Social Needs  . Financial resource strain: Not on file  . Food insecurity:    Worry: Not on file    Inability: Not on file  . Transportation needs:  Medical: Not on file    Non-medical: Not on file  Tobacco Use  . Smoking status: Former Smoker    Last attempt to quit: 05/16/1996    Years since quitting: 21.5  . Smokeless tobacco: Never Used  Substance and Sexual Activity  . Alcohol use: No  . Drug use: No  . Sexual activity: Never  Lifestyle  . Physical activity:    Days per week: Not on file    Minutes per session: Not on file  . Stress: Not on file  Relationships  . Social connections:    Talks on phone: Not on file    Gets together: Not on file    Attends religious service: Not on file    Active member of club or organization: Not on file    Attends meetings of clubs or organizations:  Not on file    Relationship status: Not on file  . Intimate partner violence:    Fear of current or ex partner: Not on file    Emotionally abused: Not on file    Physically abused: Not on file    Forced sexual activity: Not on file  Other Topics Concern  . Not on file  Social History Narrative   Widowed since Nov 2013   Lives with her son, Rosine Abe & his family   Independent ADL's-drives   Stepdaughter-Marissa is close to her   12-04-17 Unable to ask abuse questions family member with her today.  The patient lives with her son and daughter in law in [place].   Family History: Family History  Problem Relation Age of Onset  . Heart disease Mother   . Stroke Mother   . Cancer Sister        brain     ALLERGIES:  is allergic to aspirin; erythromycin; and niacin and related.  Meds: Current Outpatient Medications  Medication Sig Dispense Refill  . albuterol (PROVENTIL HFA;VENTOLIN HFA) 108 (90 BASE) MCG/ACT inhaler Inhale 2 puffs into the lungs every 6 (six) hours as needed for wheezing or shortness of breath.    . ALPRAZolam (XANAX) 1 MG tablet Take 1 mg by mouth at bedtime as needed for sleep.    Marland Kitchen amLODipine (NORVASC) 5 MG tablet Take 5 mg by mouth daily.    . diphenoxylate-atropine (LOMOTIL) 2.5-0.025 MG per tablet Take 2 tablets by mouth 4 (four) times daily as needed for diarrhea or loose stools. 40 tablet 0  . HYDROcodone-acetaminophen (NORCO) 10-325 MG tablet Take 1 tablet by mouth every 6 (six) hours as needed.    Marland Kitchen ipratropium-albuterol (DUONEB) 0.5-2.5 (3) MG/3ML SOLN Take 3 mLs by nebulization every 6 (six) hours as needed (shortness of breath).    Marland Kitchen levothyroxine (SYNTHROID, LEVOTHROID) 50 MCG tablet Take 50 mcg by mouth daily.    Marland Kitchen lisinopril (PRINIVIL,ZESTRIL) 40 MG tablet Take 40 mg by mouth daily.    Marland Kitchen omeprazole (PRILOSEC) 20 MG capsule Take 20 mg by mouth daily.    . OXYGEN Inhale 2 L/min into the lungs as needed.    Marland Kitchen Peppermint Oil (IBGARD PO) Take by  mouth.    . pravastatin (PRAVACHOL) 40 MG tablet Take 40 mg by mouth daily.    Marland Kitchen dronabinol (MARINOL) 5 MG capsule Take 5 mg by mouth 2 (two) times daily before a meal.    . meclizine (ANTIVERT) 25 MG tablet Take 25 mg by mouth as needed. Reported on 09/13/2015    . prochlorperazine (COMPAZINE) 5 MG tablet TAKE ONE TABLET BY MOUTH  EVERY 6 HOURS AS NEEDED FOR NAUSEA OR VOMITING (Patient not taking: Reported on 12/04/2017) 30 tablet 1   No current facility-administered medications for this encounter.     Physical Findings:  height is 5\' 2"  (1.575 m) and weight is 101 lb 6.4 oz (46 kg). Her oral temperature is 98.3 F (36.8 C). Her blood pressure is 147/63 (abnormal) and her pulse is 90. Her respiration is 18 and oxygen saturation is 95%.   Pain scale 0/10   In general, this is a well-appearing Caucasian female in no acute distress. She is alert and oriented x4 appropriate throughout the examination. Cardiovascular exam reveals a regular rate and rhythm, no clicks rubs or murmurs auscultated. Chest is clear to auscultation bilaterally. Bilateral breast exam does not reveal palpable masses or notable skin or nipple changes of either breast. No palpable adenopathy is noted of the supraclavicular or axillary chains. Palpable left deep chain cervical adenopathy about 69mm on the left neck is noted and unchanged. The abdomen is intact with bowel sounds x4 and is soft, non tender, and non distended. No fascial defects or palpable hepatosplenomegaly is noted. Pelvic exam reveals normal appearing external female genitalia. No lesions are seen grossly of the labial, perineal, or perianal tissue. Hyperemic change is noted consistent with prior radiotherapy. There are 2 visible hemorrhoids present without evidence of ulceration or other lesion. Speculum exam reveals a normal appearing vaginal cuff. Bimanual exam reveals smooth cuff without induration or nodularity and no palpable abnormalities are noted of the  rectovaginal septum on rectovaginal exam. Rectal tone is intact.  Lab Findings: Lab Results  Component Value Date   WBC 5.7 06/08/2013   HGB 10.1 (L) 06/08/2013   HCT 31.3 (L) 06/08/2013   MCV 97.5 06/08/2013   PLT 333 06/08/2013     Radiographic Findings: No results found.  Impression:  1. 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 The patient appears to be clinically without evidence of disease. She will return in 1 year's time for continued surveillance, and if at that time she's still doing well, she can follow for routine well adult visits with her PCP.  2. Breast cancer screening. The patient is counseled on the rationale for an annual mammogram. Again, she is not interested in that this time and understands the risks and benefits of screening versus not screening. I will continue to follow this expectantly and perform annual breast exams. 3. Intermittent diarrhea and gassiness. The patient is still having symptoms despite IBARD. She will follow up with Dr. Collene Mares as well another time for evaluation prior to discontinuing surveillance.  4. Low weight and poor appetite. Since 2015, the patient has had fluctuations in her weight. She will continue to follow up with her PCP for management of her Marinol. She is in agreement with this plan.    Carola Rhine, PAC

## 2017-12-16 DIAGNOSIS — Z23 Encounter for immunization: Secondary | ICD-10-CM | POA: Diagnosis not present

## 2018-01-30 DIAGNOSIS — R194 Change in bowel habit: Secondary | ICD-10-CM | POA: Diagnosis not present

## 2018-01-30 DIAGNOSIS — R197 Diarrhea, unspecified: Secondary | ICD-10-CM | POA: Diagnosis not present

## 2018-01-30 DIAGNOSIS — K219 Gastro-esophageal reflux disease without esophagitis: Secondary | ICD-10-CM | POA: Diagnosis not present

## 2018-01-30 DIAGNOSIS — R159 Full incontinence of feces: Secondary | ICD-10-CM | POA: Diagnosis not present

## 2018-01-30 DIAGNOSIS — Z85048 Personal history of other malignant neoplasm of rectum, rectosigmoid junction, and anus: Secondary | ICD-10-CM | POA: Diagnosis not present

## 2018-02-10 DIAGNOSIS — Z8589 Personal history of malignant neoplasm of other organs and systems: Secondary | ICD-10-CM | POA: Diagnosis not present

## 2018-02-10 DIAGNOSIS — J449 Chronic obstructive pulmonary disease, unspecified: Secondary | ICD-10-CM | POA: Diagnosis not present

## 2018-02-10 DIAGNOSIS — E46 Unspecified protein-calorie malnutrition: Secondary | ICD-10-CM | POA: Diagnosis not present

## 2018-02-10 DIAGNOSIS — I10 Essential (primary) hypertension: Secondary | ICD-10-CM | POA: Diagnosis not present

## 2018-05-13 DIAGNOSIS — M545 Low back pain: Secondary | ICD-10-CM | POA: Diagnosis not present

## 2018-05-13 DIAGNOSIS — J449 Chronic obstructive pulmonary disease, unspecified: Secondary | ICD-10-CM | POA: Diagnosis not present

## 2018-05-13 DIAGNOSIS — Z8589 Personal history of malignant neoplasm of other organs and systems: Secondary | ICD-10-CM | POA: Diagnosis not present

## 2018-05-13 DIAGNOSIS — I10 Essential (primary) hypertension: Secondary | ICD-10-CM | POA: Diagnosis not present

## 2018-08-12 DIAGNOSIS — J449 Chronic obstructive pulmonary disease, unspecified: Secondary | ICD-10-CM | POA: Diagnosis not present

## 2018-08-12 DIAGNOSIS — I10 Essential (primary) hypertension: Secondary | ICD-10-CM | POA: Diagnosis not present

## 2018-08-12 DIAGNOSIS — N390 Urinary tract infection, site not specified: Secondary | ICD-10-CM | POA: Diagnosis not present

## 2018-08-12 DIAGNOSIS — M545 Low back pain: Secondary | ICD-10-CM | POA: Diagnosis not present

## 2018-11-12 DIAGNOSIS — M545 Low back pain: Secondary | ICD-10-CM | POA: Diagnosis not present

## 2018-11-12 DIAGNOSIS — J449 Chronic obstructive pulmonary disease, unspecified: Secondary | ICD-10-CM | POA: Diagnosis not present

## 2018-11-12 DIAGNOSIS — F419 Anxiety disorder, unspecified: Secondary | ICD-10-CM | POA: Diagnosis not present

## 2018-11-12 DIAGNOSIS — I10 Essential (primary) hypertension: Secondary | ICD-10-CM | POA: Diagnosis not present

## 2018-12-03 ENCOUNTER — Telehealth: Payer: Self-pay | Admitting: Emergency Medicine

## 2018-12-08 ENCOUNTER — Ambulatory Visit: Payer: Medicare Other | Admitting: Radiation Oncology

## 2018-12-19 DIAGNOSIS — Z23 Encounter for immunization: Secondary | ICD-10-CM | POA: Diagnosis not present

## 2019-02-04 DIAGNOSIS — R739 Hyperglycemia, unspecified: Secondary | ICD-10-CM | POA: Diagnosis not present

## 2019-02-04 DIAGNOSIS — I10 Essential (primary) hypertension: Secondary | ICD-10-CM | POA: Diagnosis not present

## 2019-02-04 DIAGNOSIS — J449 Chronic obstructive pulmonary disease, unspecified: Secondary | ICD-10-CM | POA: Diagnosis not present

## 2019-02-04 DIAGNOSIS — F419 Anxiety disorder, unspecified: Secondary | ICD-10-CM | POA: Diagnosis not present

## 2019-02-04 DIAGNOSIS — M545 Low back pain: Secondary | ICD-10-CM | POA: Diagnosis not present

## 2019-02-10 DIAGNOSIS — Z8589 Personal history of malignant neoplasm of other organs and systems: Secondary | ICD-10-CM | POA: Diagnosis not present

## 2019-02-10 DIAGNOSIS — J449 Chronic obstructive pulmonary disease, unspecified: Secondary | ICD-10-CM | POA: Diagnosis not present

## 2019-02-10 DIAGNOSIS — F419 Anxiety disorder, unspecified: Secondary | ICD-10-CM | POA: Diagnosis not present

## 2019-02-10 DIAGNOSIS — Z23 Encounter for immunization: Secondary | ICD-10-CM | POA: Diagnosis not present

## 2019-02-10 DIAGNOSIS — M545 Low back pain: Secondary | ICD-10-CM | POA: Diagnosis not present

## 2019-02-26 DIAGNOSIS — R194 Change in bowel habit: Secondary | ICD-10-CM | POA: Diagnosis not present

## 2019-02-26 DIAGNOSIS — Z85048 Personal history of other malignant neoplasm of rectum, rectosigmoid junction, and anus: Secondary | ICD-10-CM | POA: Diagnosis not present

## 2019-02-26 DIAGNOSIS — K601 Chronic anal fissure: Secondary | ICD-10-CM | POA: Diagnosis not present

## 2019-02-26 DIAGNOSIS — K625 Hemorrhage of anus and rectum: Secondary | ICD-10-CM | POA: Diagnosis not present

## 2019-04-27 DIAGNOSIS — M25552 Pain in left hip: Secondary | ICD-10-CM | POA: Diagnosis not present

## 2019-04-27 DIAGNOSIS — G4700 Insomnia, unspecified: Secondary | ICD-10-CM | POA: Diagnosis not present

## 2019-04-27 DIAGNOSIS — Z79891 Long term (current) use of opiate analgesic: Secondary | ICD-10-CM | POA: Diagnosis not present

## 2019-04-27 DIAGNOSIS — J449 Chronic obstructive pulmonary disease, unspecified: Secondary | ICD-10-CM | POA: Diagnosis not present

## 2019-05-18 ENCOUNTER — Other Ambulatory Visit (HOSPITAL_COMMUNITY): Payer: Self-pay | Admitting: Family Medicine

## 2019-05-18 DIAGNOSIS — N951 Menopausal and female climacteric states: Secondary | ICD-10-CM

## 2019-05-19 ENCOUNTER — Ambulatory Visit: Payer: Medicare Other | Attending: Internal Medicine

## 2019-05-19 DIAGNOSIS — Z23 Encounter for immunization: Secondary | ICD-10-CM | POA: Insufficient documentation

## 2019-05-19 NOTE — Progress Notes (Signed)
   Covid-19 Vaccination Clinic  Name:  Sara Cochran    MRN: [id number] DOB: 08/04/1933  05/19/2019  [name] was observed post Covid-19 immunization for 15 minutes without incident. She was provided with Vaccine Information Sheet and instruction to access the V-Safe system.   [name] was instructed to call 911 with any severe reactions post vaccine: Marland Kitchen Difficulty breathing  . Swelling of face and throat  . A fast heartbeat  . A bad rash all over body  . Dizziness and weakness   Immunizations Administered    Name Date Dose VIS Date Route   Moderna COVID-19 Vaccine 05/19/2019 11:41 AM 0.5 mL 02/17/2019 Intramuscular   Manufacturer: Moderna   Lot: [id number]   [id number]

## 2019-06-16 ENCOUNTER — Ambulatory Visit: Payer: Medicare Other | Attending: Internal Medicine

## 2019-06-16 DIAGNOSIS — Z23 Encounter for immunization: Secondary | ICD-10-CM

## 2019-06-16 NOTE — Progress Notes (Signed)
   Covid-19 Vaccination Clinic  Name:  Sara Cochran    MRN: [id number] DOB: 02/06/1934  06/16/2019  [name] was observed post Covid-19 immunization for 30 minutes based on pre-vaccination screening without incident. She was provided with Vaccine Information Sheet and instruction to access the V-Safe system.   [name] was instructed to call 911 with any severe reactions post vaccine: Marland Kitchen Difficulty breathing  . Swelling of face and throat  . A fast heartbeat  . A bad rash all over body  . Dizziness and weakness   Immunizations Administered    Name Date Dose VIS Date Route   Moderna COVID-19 Vaccine 06/16/2019 12:59 PM 0.5 mL 02/17/2019 Intramuscular   Manufacturer: Moderna   Lot: [id number]   [id number]

## 2019-06-21 ENCOUNTER — Ambulatory Visit: Payer: Medicare Other

## 2019-07-06 ENCOUNTER — Telehealth: Payer: Self-pay | Admitting: *Deleted

## 2019-07-06 NOTE — Telephone Encounter (Signed)
CALLED PATIENT TO ASK IF SHE IS READY TO GET BACK ON THE SCHEDULE, LVM FOR A RETURN CALL

## 2019-07-22 DIAGNOSIS — M25552 Pain in left hip: Secondary | ICD-10-CM | POA: Diagnosis not present

## 2019-07-22 DIAGNOSIS — J432 Centrilobular emphysema: Secondary | ICD-10-CM | POA: Diagnosis not present

## 2019-07-22 DIAGNOSIS — J709 Respiratory conditions due to unspecified external agent: Secondary | ICD-10-CM | POA: Diagnosis not present

## 2019-07-22 DIAGNOSIS — I10 Essential (primary) hypertension: Secondary | ICD-10-CM | POA: Diagnosis not present

## 2019-10-06 DIAGNOSIS — K2100 Gastro-esophageal reflux disease with esophagitis, without bleeding: Secondary | ICD-10-CM | POA: Diagnosis not present

## 2019-10-06 DIAGNOSIS — E039 Hypothyroidism, unspecified: Secondary | ICD-10-CM | POA: Diagnosis not present

## 2019-10-21 DIAGNOSIS — M545 Low back pain: Secondary | ICD-10-CM | POA: Diagnosis not present

## 2019-10-21 DIAGNOSIS — I10 Essential (primary) hypertension: Secondary | ICD-10-CM | POA: Diagnosis not present

## 2019-10-21 DIAGNOSIS — E039 Hypothyroidism, unspecified: Secondary | ICD-10-CM | POA: Diagnosis not present

## 2019-10-21 DIAGNOSIS — K2100 Gastro-esophageal reflux disease with esophagitis, without bleeding: Secondary | ICD-10-CM | POA: Diagnosis not present

## 2020-01-19 DIAGNOSIS — Z79891 Long term (current) use of opiate analgesic: Secondary | ICD-10-CM | POA: Diagnosis not present

## 2020-01-19 DIAGNOSIS — M549 Dorsalgia, unspecified: Secondary | ICD-10-CM | POA: Diagnosis not present

## 2020-01-19 DIAGNOSIS — I10 Essential (primary) hypertension: Secondary | ICD-10-CM | POA: Diagnosis not present

## 2020-01-19 DIAGNOSIS — E785 Hyperlipidemia, unspecified: Secondary | ICD-10-CM | POA: Diagnosis not present

## 2021-06-20 ENCOUNTER — Other Ambulatory Visit (HOSPITAL_COMMUNITY)
Admission: RE | Admit: 2021-06-20 | Discharge: 2021-06-20 | Disposition: A | Discharge status: Home / Self Care | Payer: Medicare Other | Source: Ambulatory Visit | Attending: Family Medicine | Admitting: Family Medicine

## 2021-06-20 DIAGNOSIS — R7989 Other specified abnormal findings of blood chemistry: Secondary | ICD-10-CM | POA: Insufficient documentation

## 2021-06-20 LAB — BASIC METABOLIC PANEL
Anion gap: 9 (ref 5–15)
BUN: 25 mg/dL — ABNORMAL HIGH (ref 8–23)
CO2: 31 mmol/L (ref 22–32)
Calcium: 9.3 mg/dL (ref 8.9–10.3)
Chloride: 98 mmol/L (ref 98–111)
Creatinine, Ser: 0.91 mg/dL (ref 0.44–1.00)
GFR, Estimated: >60 mL/min (ref >60)
Glucose, Bld: 98 mg/dL (ref 70–99)
Potassium: 4.5 mmol/L (ref 3.5–5.1)
Sodium: 138 mmol/L (ref 135–145)

## 2022-07-25 ENCOUNTER — Other Ambulatory Visit (HOSPITAL_COMMUNITY): Payer: Self-pay | Admitting: Nurse Practitioner

## 2022-07-25 ENCOUNTER — Ambulatory Visit (HOSPITAL_COMMUNITY)
Admission: RE | Admit: 2022-07-25 | Discharge: 2022-07-25 | Disposition: A | Discharge status: Home / Self Care | Payer: Medicare Other | Source: Ambulatory Visit | Attending: Nurse Practitioner | Admitting: Nurse Practitioner

## 2022-07-25 DIAGNOSIS — R0781 Pleurodynia: Secondary | ICD-10-CM

## 2023-07-24 ENCOUNTER — Emergency Department (HOSPITAL_COMMUNITY)

## 2023-07-24 ENCOUNTER — Ambulatory Visit
Admission: EM | Admit: 2023-07-24 | Discharge: 2023-07-24 | Disposition: A | Discharge status: Home / Self Care | Attending: Family Medicine | Admitting: Family Medicine

## 2023-07-24 ENCOUNTER — Emergency Department (HOSPITAL_COMMUNITY)
Admission: EM | Admit: 2023-07-24 | Discharge: 2023-07-24 | Disposition: A | Discharge status: Home / Self Care | Attending: Emergency Medicine | Admitting: Emergency Medicine

## 2023-07-24 ENCOUNTER — Encounter: Payer: Self-pay | Admitting: Emergency Medicine

## 2023-07-24 ENCOUNTER — Encounter (HOSPITAL_COMMUNITY): Payer: Self-pay | Admitting: *Deleted

## 2023-07-24 ENCOUNTER — Other Ambulatory Visit: Payer: Self-pay

## 2023-07-24 DIAGNOSIS — R0689 Other abnormalities of breathing: Secondary | ICD-10-CM | POA: Insufficient documentation

## 2023-07-24 DIAGNOSIS — Z7951 Long term (current) use of inhaled steroids: Secondary | ICD-10-CM | POA: Diagnosis not present

## 2023-07-24 DIAGNOSIS — R441 Visual hallucinations: Secondary | ICD-10-CM | POA: Diagnosis present

## 2023-07-24 DIAGNOSIS — R443 Hallucinations, unspecified: Secondary | ICD-10-CM

## 2023-07-24 DIAGNOSIS — R4182 Altered mental status, unspecified: Secondary | ICD-10-CM | POA: Diagnosis not present

## 2023-07-24 DIAGNOSIS — J449 Chronic obstructive pulmonary disease, unspecified: Secondary | ICD-10-CM | POA: Diagnosis not present

## 2023-07-24 DIAGNOSIS — Z85048 Personal history of other malignant neoplasm of rectum, rectosigmoid junction, and anus: Secondary | ICD-10-CM | POA: Diagnosis not present

## 2023-07-24 DIAGNOSIS — R918 Other nonspecific abnormal finding of lung field: Secondary | ICD-10-CM | POA: Insufficient documentation

## 2023-07-24 LAB — CBC WITH DIFFERENTIAL/PLATELET
Abs Immature Granulocytes: 0.02 10*3/uL (ref 0.00–0.07)
Basophils Absolute: 0 10*3/uL (ref 0.0–0.1)
Basophils Relative: 0 %
Eosinophils Absolute: 0 10*3/uL (ref 0.0–0.5)
Eosinophils Relative: 0 %
HCT: 32.8 % — ABNORMAL LOW (ref 36.0–46.0)
Hemoglobin: 10 g/dL — ABNORMAL LOW (ref 12.0–15.0)
Immature Granulocytes: 0 %
Lymphocytes Relative: 13 %
Lymphs Abs: 0.7 10*3/uL (ref 0.7–4.0)
MCH: 28.4 pg (ref 26.0–34.0)
MCHC: 30.5 g/dL (ref 30.0–36.0)
MCV: 93.2 fL (ref 80.0–100.0)
Monocytes Absolute: 0.3 10*3/uL (ref 0.1–1.0)
Monocytes Relative: 6 %
Neutro Abs: 4.2 10*3/uL (ref 1.7–7.7)
Neutrophils Relative %: 81 %
Platelets: 225 10*3/uL (ref 150–400)
RBC: 3.52 MIL/uL — ABNORMAL LOW (ref 3.87–5.11)
RDW: 12.6 % (ref 11.5–15.5)
WBC: 5.3 10*3/uL (ref 4.0–10.5)
nRBC: 0 % (ref 0.0–0.2)

## 2023-07-24 LAB — COMPREHENSIVE METABOLIC PANEL WITH GFR
ALT: 19 U/L (ref 0–44)
AST: 30 U/L (ref 15–41)
Albumin: 4.4 g/dL (ref 3.5–5.0)
Alkaline Phosphatase: 82 U/L (ref 38–126)
Anion gap: 11 (ref 5–15)
BUN: 23 mg/dL (ref 8–23)
CO2: 33 mmol/L — ABNORMAL HIGH (ref 22–32)
Calcium: 9.3 mg/dL (ref 8.9–10.3)
Chloride: 92 mmol/L — ABNORMAL LOW (ref 98–111)
Creatinine, Ser: 0.99 mg/dL (ref 0.44–1.00)
GFR, Estimated: 55 mL/min — ABNORMAL LOW (ref >60)
Glucose, Bld: 116 mg/dL — ABNORMAL HIGH (ref 70–99)
Potassium: 4.3 mmol/L (ref 3.5–5.1)
Sodium: 136 mmol/L (ref 135–145)
Total Bilirubin: 0.4 mg/dL (ref 0.0–1.2)
Total Protein: 7 g/dL (ref 6.5–8.1)

## 2023-07-24 LAB — POCT URINALYSIS DIP (MANUAL ENTRY)
Glucose, UA: NEGATIVE mg/dL
Leukocytes, UA: NEGATIVE
Nitrite, UA: NEGATIVE
Protein Ur, POC: >=300 mg/dL — AB
Spec Grav, UA: >=1.03 — AB (ref 1.010–1.025)
Urobilinogen, UA: 1 U/dL
pH, UA: 5.5 (ref 5.0–8.0)

## 2023-07-24 LAB — URINALYSIS, ROUTINE W REFLEX MICROSCOPIC
Bilirubin Urine: NEGATIVE
Glucose, UA: NEGATIVE mg/dL
Ketones, ur: NEGATIVE mg/dL
Leukocytes,Ua: NEGATIVE
Nitrite: NEGATIVE
Protein, ur: 100 mg/dL — AB
Specific Gravity, Urine: 1.02 (ref 1.005–1.030)
pH: 6 (ref 5.0–8.0)

## 2023-07-24 LAB — BLOOD GAS, VENOUS
Acid-Base Excess: 12.5 mmol/L — ABNORMAL HIGH (ref 0.0–2.0)
Bicarbonate: 40.3 mmol/L — ABNORMAL HIGH (ref 20.0–28.0)
Drawn by: 7012
O2 Saturation: 66.6 %
Patient temperature: 36.6
pCO2, Ven: 64 mmHg — ABNORMAL HIGH (ref 44–60)
pH, Ven: 7.41 (ref 7.25–7.43)
pO2, Ven: 35 mmHg (ref 32–45)

## 2023-07-24 LAB — CBG MONITORING, ED: Glucose-Capillary: 114 mg/dL — ABNORMAL HIGH (ref 70–99)

## 2023-07-24 MED ORDER — IOHEXOL 300 MG/ML  SOLN
75.0000 mL | Freq: Once | INTRAMUSCULAR | Status: AC | PRN
  Administered 2023-07-24: 75 mL via INTRAVENOUS

## 2023-07-24 NOTE — ED Notes (Signed)
 Patient transported to CT

## 2023-07-24 NOTE — ED Notes (Signed)
 Patient is being discharged from the Urgent Care and sent to the Emergency Department via private vehicle . Per NP, patient is in need of higher level of care due to confusion. Patient is aware and verbalizes understanding of plan of care.  Vitals:   07/24/23 1027  BP: 130/74  Pulse: 84  Resp: 18  Temp: 97.9 F (36.6 C)  SpO2: 91%

## 2023-07-24 NOTE — ED Provider Notes (Signed)
 [place] EMERGENCY DEPARTMENT AT [facility] Provider Note   CSN: [id number] Arrival date & time: 07/24/23  1153     History  No chief complaint on file.   Sara Cochran is a 89 y.o. female.  89 year old female with history of COPD on 4 L nasal cannula, chronic back pain, and rectal cancer in remission who presents to the emergency department with hallucinations.  History obtained per the patient's son.  Reports that since yesterday patient has been having some visual hallucinations.  Says that she was talking to his gun safe thinking it was a person.  She also accused him and his wife of stealing her clothes.  Says that she attempted to pack up her things and leave the house to make more room for "them" referring to other people who were not present.  Denies any recent illnesses.  No new medications.  Had a similar episode previously and was treated for UTI but says that her mentation is worse.  Lives at home with her son and is fairly independent.       Home Medications Prior to Admission medications   Medication Sig Start Date End Date Taking? Authorizing Provider  albuterol  (PROVENTIL  HFA;VENTOLIN  HFA) 108 (90 BASE) MCG/ACT inhaler Inhale 2 puffs into the lungs every 6 (six) hours as needed for wheezing or shortness of breath.   Yes [provider]  ALPRAZolam  (XANAX ) 1 MG tablet Take 0.5 mg by mouth at bedtime as needed for sleep.   Yes [provider]  amLODipine  (NORVASC ) 10 MG tablet Take 10 mg by mouth daily. 07/10/23  Yes [provider]  diphenoxylate -atropine  (LOMOTIL ) 2.5-0.025 MG per tablet Take 2 tablets by mouth 4 (four) times daily as needed for diarrhea or loose stools. 04/07/13  Yes Opal Bill, MD  HYDROcodone -acetaminophen  (NORCO) 10-325 MG tablet Take 1 tablet by mouth every 6 (six) hours as needed.   Yes [provider]  ipratropium (ATROVENT ) 0.02 % nebulizer solution Take 0.5 mg by nebulization every 4 (four) hours  as needed for wheezing or shortness of breath.   Yes [provider]  ipratropium-albuterol  (DUONEB) 0.5-2.5 (3) MG/3ML SOLN Take 3 mLs by nebulization every 6 (six) hours as needed (shortness of breath).   Yes [provider]  levothyroxine (SYNTHROID, LEVOTHROID) 50 MCG tablet Take 50 mcg by mouth daily. 06/15/16  Yes [provider]  omeprazole (PRILOSEC) 20 MG capsule Take 20 mg by mouth daily.   Yes [provider]  OXYGEN  Inhale 4 L into the lungs continuous.   Yes [provider]  Peppermint Oil (IBGARD PO) Take by mouth.   Yes [provider]  pravastatin (PRAVACHOL) 40 MG tablet Take 40 mg by mouth daily.   Yes [provider]  amLODipine  (NORVASC ) 5 MG tablet Take 10 mg by mouth daily.    [provider]      Allergies    Aspirin, Erythromycin, and Niacin and related    Review of Systems   Review of Systems  Physical Exam Updated Vital Signs BP 106/78 (BP Location: Right Arm)   Pulse 80   Temp 97.9 F (36.6 C) (Oral)   Resp 18   Ht 5\' 2"  (1.575 m)   Wt 29.7 kg   SpO2 99%   BMI 11.98 kg/m  Physical Exam Vitals and nursing note reviewed.  Constitutional:      General: She is not in acute distress.    Appearance: She is well-developed.  Comments: Alert and oriented to self, place, but thought the year was 2024  HENT:     Head: Normocephalic and atraumatic.     Right Ear: External ear normal.     Left Ear: External ear normal.     Nose: Nose normal.  Eyes:     Extraocular Movements: Extraocular movements intact.     Conjunctiva/sclera: Conjunctivae normal.     Pupils: Pupils are equal, round, and reactive to light.  Cardiovascular:     Rate and Rhythm: Normal rate and regular rhythm.     Heart sounds: No murmur heard. Pulmonary:     Effort: Pulmonary effort is normal. No respiratory distress.     Breath sounds: Normal breath sounds.     Comments: 4L [place] - at baseline Abdominal:      General: Abdomen is flat. There is no distension.     Palpations: Abdomen is soft. There is no mass.     Tenderness: There is no abdominal tenderness. There is no guarding.  Musculoskeletal:     Cervical back: Normal range of motion and neck supple.     Right lower leg: No edema.     Left lower leg: No edema.  Skin:    General: Skin is warm and dry.  Neurological:     Mental Status: She is alert. Mental status is at baseline.     Cranial Nerves: No cranial nerve deficit.     Sensory: No sensory deficit.     Motor: No weakness.  Psychiatric:        Mood and Affect: Mood normal.     ED Results / Procedures / Treatments   Labs (all labs ordered are listed, but only abnormal results are displayed) Labs Reviewed  URINALYSIS, ROUTINE W REFLEX MICROSCOPIC - Abnormal; Notable for the following components:      Result Value   Hgb urine dipstick SMALL (*)    Protein, ur 100 (*)    Bacteria, UA RARE (*)    All other components within normal limits  COMPREHENSIVE METABOLIC PANEL WITH GFR - Abnormal; Notable for the following components:   Chloride 92 (*)    CO2 33 (*)    Glucose, Bld 116 (*)    GFR, Estimated 55 (*)    All other components within normal limits  CBC WITH DIFFERENTIAL/PLATELET - Abnormal; Notable for the following components:   RBC 3.52 (*)    Hemoglobin 10.0 (*)    HCT 32.8 (*)    All other components within normal limits  BLOOD GAS, VENOUS - Abnormal; Notable for the following components:   pCO2, Ven 64 (*)    Bicarbonate 40.3 (*)    Acid-Base Excess 12.5 (*)    All other components within normal limits  CBG MONITORING, ED - Abnormal; Notable for the following components:   Glucose-Capillary 114 (*)    All other components within normal limits    EKG None  Radiology CT Chest W Contrast Result Date: 07/24/2023 CLINICAL DATA:  lung mass. History of anal carcinoma. * Tracking Code: BO * EXAM: CT CHEST WITH CONTRAST TECHNIQUE: Multidetector CT imaging of the  chest was performed during intravenous contrast administration. RADIATION DOSE REDUCTION: This exam was performed according to the departmental dose-optimization program which includes automated exposure control, adjustment of the mA and/or kV according to patient size and/or use of iterative reconstruction technique. CONTRAST:  75mL OMNIPAQUE  IOHEXOL  300 MG/ML  SOLN COMPARISON:  CT scan chest from 02/05/2013. FINDINGS: Cardiovascular: Normal cardiac size. No  pericardial effusion. No aortic aneurysm. There are coronary artery calcifications, in keeping with coronary artery disease. There are also moderate to severe peripheral atherosclerotic vascular calcifications of thoracic aorta and its major branches. There is a penetrating atherosclerotic ulcer arising from the left wall of the proximal descending thoracic aorta (series 2, image 47), measuring 5 x 13 mm. Mediastinum/Nodes: Visualized thyroid  gland appears grossly unremarkable. No solid / cystic mediastinal masses. The esophagus is nondistended precluding optimal assessment. No axillary, mediastinal or hilar lymphadenopathy by size criteria. Lungs/Pleura: The central tracheo-bronchial tree is patent. There are extensive emphysematous changes throughout bilateral lungs. There is a multilobulated at least 1.7 x 2.3 cm nodule in the left lung upper lobe abutting the pleura. There is smaller similar characteristic component along the superior aspect of the nodule. There is adjacent small to medium emphysematous bulla. This is suspicious. Further evaluation with tissue sampling versus nuclear medicine PET-CT scan is recommended. No lung consolidation, pleural effusion or pneumothorax. Upper Abdomen: There is at least moderate sized sliding hiatal hernia. Remaining visualized upper abdominal viscera within normal limits. Musculoskeletal: The visualized soft tissues of the chest wall are grossly unremarkable. No suspicious osseous lesions. There are moderate  multilevel degenerative changes in the visualized spine. There are multiple age indeterminate compression deformities of thoracolumbar spine (T4, T7, T8, T10, T11 and L2 vertebrae). No significant retropulsion or spinal canal compromise. IMPRESSION: 1. There is a multilobulated at least 1.7 x 2.3 cm nodule in the left lung upper lobe abutting the pleura. There is smaller similar characteristic component along the superior aspect of the nodule. This is suspicious. Further evaluation with tissue sampling versus nuclear medicine PET-CT scan is recommended. 2. Multiple age indeterminate thoracolumbar vertebral compression deformities, as described above. No significant retropulsion or spinal canal compromise. 3. Penetrating atherosclerotic ulcer arising from the left wall of the proximal descending thoracic aorta. 4. Multiple other nonacute observations, as described above. Aortic Atherosclerosis (ICD10-I70.0) and Emphysema (ICD10-J43.9). Electronically Signed   By: Beula Brunswick M.D.   On: 07/24/2023 15:17   DG Chest 2 View Result Date: 07/24/2023 CLINICAL DATA:  Altered mental status. EXAM: CHEST - 2 VIEW COMPARISON:  Jul 25, 2022. FINDINGS: The heart size and mediastinal contours are within normal limits. Left lower lobe nodular density is noted concerning for possible neoplasm. Possible bilateral scarring or multifocal inflammation is noted. Multiple old thoracic compression fractures are noted. IMPRESSION: Left lower lobe nodular density is noted concerning for possible neoplasm. CT scan of the chest is recommended for further evaluation. Electronically Signed   By: Rosalene Colon M.D.   On: 07/24/2023 13:39   CT HEAD WO CONTRAST Result Date: 07/24/2023 CLINICAL DATA:  Altered mental status. Confusion beginning last night. EXAM: CT HEAD WITHOUT CONTRAST TECHNIQUE: Contiguous axial images were obtained from the base of the skull through the vertex without intravenous contrast. RADIATION DOSE REDUCTION: This  exam was performed according to the departmental dose-optimization program which includes automated exposure control, adjustment of the mA and/or kV according to patient size and/or use of iterative reconstruction technique. COMPARISON:  None Available. FINDINGS: Brain: Age related volume loss. No evidence of old or acute focal infarction, mass lesion, hemorrhage, hydrocephalus or extra-axial collection. Vascular: There is atherosclerotic calcification of the major vessels at the base of the brain. Skull: Negative Sinuses/Orbits: Opacified right division of the sphenoid sinus. Other sinuses are clear. Orbits negative. Other: None IMPRESSION: 1. No acute CT finding. Age related volume loss. Atherosclerotic calcification of the major vessels at the  base of the brain. 2. Opacified right division of the sphenoid sinus. Electronically Signed   By: Bettylou Brunner M.D.   On: 07/24/2023 13:06    Procedures Procedures    Medications Ordered in ED Medications  iohexol  (OMNIPAQUE ) 300 MG/ML solution 75 mL (75 mLs Intravenous Contrast Given 07/24/23 1451)    ED Course/ Medical Decision Making/ A&P Clinical Course as of 07/24/23 1650  Wed Jul 24, 2023  1516 Signed out to Dr Annabell Key [RP]    Clinical Course User Index [RP] Ninetta Basket, MD                                 Medical Decision Making Amount and/or Complexity of Data Reviewed Labs: ordered. Radiology: ordered.  Risk Prescription drug management.   VITALINA NASTASI is a 89 y.o. female with comorbidities that complicate the patient evaluation including COPD on 4 L nasal cannula, chronic back pain, and rectal cancer in remission who presents to the emergency department with hallucinations.   Initial Ddx:  Urinary tract infection, delirium, ICH, pneumonia, hypercapnic respiratory failure, electrolyte abnormality  MDM/Course:  Patient presents emergency department with visual hallucinations.  No obvious infectious symptoms or other causes of  her delirium.  She has a nonfocal neuroexam.  Is alert and oriented x 2 (thought the year was 2024 rather than 2025).  Had a CT head did not show acute abnormalities.  Chest x-ray with lung mass that they are recommending a CT of the chest for.  Son at the bedside was informed that was concerning for malignancy.  Did discuss that she is delirious and at this point in time it is unclear what could be causing it.  There is a potential that she could have brain mets or leptomeningeal disease causing her symptoms.  He expressed that he understood.  We had an extensive discussion regarding her goals of care if this were the case.  He felt that she would rather go home and be comfortable rather than be admitted or undergo extensive testing like an MRI which she would not tolerate well or any sort of painful treatment.  He would prefer to follow-up with her primary doctor after discharge from the emergency department.  Urinalysis without evidence of UTI.  Lab work unremarkable otherwise.  Signed out to the oncoming physician awaiting CT results.  This patient presents to the ED for concern of complaints listed in HPI, this involves an extensive number of treatment options, and is a complaint that carries with it a high risk of complications and morbidity. Disposition including potential need for admission considered.   Dispo: Pending remainder of workup  Additional history obtained from son Records reviewed Outpatient Clinic Notes The following labs were independently interpreted: Chemistry and show no acute abnormality I independently reviewed the following imaging with scope of interpretation limited to determining acute life threatening conditions related to emergency care: CT Head and agree with the radiologist interpretation with the following exceptions: none I personally reviewed and interpreted cardiac monitoring: normal sinus rhythm  I personally reviewed and interpreted the pt's EKG: see above for  interpretation  I have reviewed the patients home medications and made adjustments as needed Social Determinants of health:  Geriatric  Portions of this note were generated with Scientist, clinical (histocompatibility and immunogenetics). Dictation errors may occur despite best attempts at proofreading.     Final Clinical Impression(s) / ED Diagnoses Final diagnoses:  Hallucinations  Lung  mass  Hypercapnia    Rx / DC Orders ED Discharge Orders     None         Ninetta Basket, MD 07/24/23 1651

## 2023-07-24 NOTE — ED Notes (Signed)
Pt returned from CT Scan 

## 2023-07-24 NOTE — ED Provider Notes (Signed)
 Patient accepted at change of shift, elderly female 89 years old with known lung disease on chronic oxygen  presents with some hallucinations, CO2 noted to be elevated on ABG at 64, pH of 7.4, no hypoxia.  Her workup has been unremarkable except for a CT scan showing a upper lobe mass, I discussed this with the patient and the son at the bedside, they are aware of both the aortic abnormality with the ulceration as well as the lung abnormality and they will be referred to both vascular and oncology although I do not think the patient is a great surgical candidate they can talk to a specialist for other options of care.  At this time the patient is hemodynamically stable answering my questions, seems very appropriate, she is not delirious confused or altered.  Vital signs reassuring.   Early Glisson, MD 07/24/23 [phone]

## 2023-07-24 NOTE — Discharge Instructions (Signed)
 Please proceed to the Emergency Department for further evaluation of this transient confusion.

## 2023-07-24 NOTE — ED Notes (Signed)
 ED Provider at bedside.

## 2023-07-24 NOTE — Discharge Instructions (Addendum)
 You were seen for your confusion and hallucinations in the emergency department.   Follow-up with your primary doctor in 2-3 days regarding your visit.  Please talk to them about the lung mass that was seen in any additional workup that he may need for this.  Your blood work showed that your carbon dioxide level was elevated, for this I would like for you to take your albuterol  treatments every 6 hours for the next 3 days and have your family doctor recheck your blood work on Monday.  Please see Dr. Charlotte Cookey or somebody at the vascular surgery office within the next 1 to 2 weeks.  They may or may not have any specific recommendations about the ulcer in your aorta but this is a very significant problem that they need to know about.  There may or may not be any solutions for you given your age and debility.  Please see Dr. Cheree Cords in the cancer center, the phone number is attached above.  You should be seen within the next 1 to 2 weeks because of the mass in your lung.  Return immediately to the emergency department if you experience any of the following: Worsening confusion, seizures, weakness or numbness on arm or leg, difficulty speaking, or any other concerning symptoms.    Thank you for visiting our Emergency Department. It was a pleasure taking care of you today.

## 2023-07-24 NOTE — ED Triage Notes (Signed)
 Confusion started last night.  Son states patient is not normally confused.  States she has been taking to and seeing people that aren't there

## 2023-07-24 NOTE — ED Provider Notes (Addendum)
 [facility]    CSN: [id number] Arrival date & time: 07/24/23  1019      History   Chief Complaint No chief complaint on file.   HPI Sara Cochran is a 89 y.o. female.   Patient is brought in for evaluation of some transient confusion that has been noted since last night.  Family member with the patient states that she started talking to his safe last night as if someone was there.  This morning, she was noted to have packed up all of the things on her bed-told him that she was making room for the people/family that were sitting on her bed.  She also verbalized concern that her family was trying to get rid of her and had hidden her robe from her intentionally.  Family member states that this is unusual for her-she has had some very small mental status changes in the past that resolved.  The patient is not currently reporting any discomfort or concerns.  No reported fevers, cough, chest pain, shortness of breath, abdominal pain, vomiting, diarrhea, or dysuria.  She is still ambulatory per her normal, no reported motor deficits.  The history is provided by the patient and a relative. The history is limited by the condition of the patient.    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   COPD (chronic obstructive pulmonary disease) (HCC)    High cholesterol    Hx of radiation therapy 02/23/13-04/06/13    rectal 50.4Gy/36fx   Hypertension    Migraine    Perirectal abscess    Weight loss    1 year 25 lbs    Patient Active Problem List   Diagnosis Date Noted   Hypomagnesemia 04/30/2013   Hypokalemia 04/30/2013   Diarrhea 04/30/2013   Hypocalcemia 04/30/2013   Physical deconditioning 04/30/2013   Colitis 04/30/2013   UTI (urinary tract infection) 04/30/2013   DVT (deep venous thrombosis) (HCC) 04/27/2013   Dehydration 04/23/2013   Sepsis (HCC) 04/23/2013    Neutropenia (HCC) 04/23/2013   Anemia of chronic disease 04/23/2013   Thrombocytopenia, unspecified (HCC) 04/23/2013   HTN (hypertension) 04/23/2013   Anxiety state 04/23/2013   Severe protein-calorie malnutrition (HCC) 04/23/2013   Anal cancer (HCC) 01/26/2013    Past Surgical History:  Procedure Laterality Date   ABDOMINAL HYSTERECTOMY     CESAREAN SECTION     COLONOSCOPY W/ POLYPECTOMY  01/23/2001   polypoid colonic mucosa,no adenomatous change or malignancy identified   CYST REMOVAL HAND     3 removed from right hand   RECTAL BIOPSY  01/21/2013   invasive squamous cell ca,mod to poorly differentiatiated    OB History   No obstetric history on file.      Home Medications    Prior to Admission medications   Medication Sig Start Date End Date Taking? Authorizing Provider  ipratropium (ATROVENT ) 0.02 % nebulizer solution Take 0.5 mg by nebulization every 4 (four) hours as needed for wheezing or shortness of breath.   Yes [provider]  albuterol  (PROVENTIL  HFA;VENTOLIN  HFA) 108 (90 BASE) MCG/ACT inhaler Inhale 2 puffs into the lungs every 6 (six) hours as needed for wheezing or shortness of breath.    [provider]  ALPRAZolam  (XANAX ) 1 MG tablet Take 1 mg by mouth at bedtime as needed for sleep.    [provider]  amLODipine  (NORVASC ) 5 MG tablet Take 10 mg by mouth daily.    [provider]  diphenoxylate -atropine  (LOMOTIL ) 2.5-0.025 MG per tablet Take 2 tablets by mouth 4 (four) times daily as needed for diarrhea or loose stools. 04/07/13   Opal Bill, MD  HYDROcodone -acetaminophen  (NORCO) 10-325 MG tablet Take 1 tablet by mouth every 6 (six) hours as needed.    [provider]  ipratropium-albuterol  (DUONEB) 0.5-2.5 (3) MG/3ML SOLN Take 3 mLs by nebulization every 6 (six) hours as needed (shortness of breath).    [provider]  levothyroxine (SYNTHROID, LEVOTHROID) 50 MCG tablet Take 50 mcg by mouth daily. 06/15/16    [provider]  omeprazole (PRILOSEC) 20 MG capsule Take 20 mg by mouth daily.    [provider]  OXYGEN  Inhale 2 L/min into the lungs as needed.    [provider]  Peppermint Oil (IBGARD PO) Take by mouth.    [provider]  pravastatin (PRAVACHOL) 40 MG tablet Take 40 mg by mouth daily.    [provider]    Family History Family History  Problem Relation Age of Onset   Heart disease Mother    Stroke Mother    Cancer Sister        brain    Social History Social History   Tobacco Use   Smoking status: Former    Current packs/day: 0.00    Types: Cigarettes    Quit date: 05/16/1996    Years since quitting: 27.2   Smokeless tobacco: Never  Substance Use Topics   Alcohol use: No   Drug use: No     Allergies   Aspirin, Erythromycin, and Niacin and related   Review of Systems Review of Systems  Constitutional:  Negative for activity change, appetite change, fatigue and fever.  HENT:  Negative for congestion, rhinorrhea and sore throat.   Eyes:  Negative for pain and redness.  Respiratory:  Negative for cough, chest tightness and shortness of breath.   Cardiovascular:  Negative for chest pain and palpitations.  Gastrointestinal:  Negative for abdominal pain, diarrhea and vomiting.  Genitourinary:  Negative for decreased urine volume, difficulty urinating, dysuria and urgency.  Musculoskeletal:  Negative for arthralgias and myalgias.  Skin:  Negative for rash.  Neurological:  Negative for weakness and headaches.  Psychiatric/Behavioral:  Positive for confusion and hallucinations. Negative for agitation and sleep disturbance. The patient is not nervous/anxious.      Physical Exam Triage Vital Signs ED Triage Vitals [07/24/23 1027]  Encounter Vitals Group     BP 130/74     Systolic BP Percentile      Diastolic BP Percentile      Pulse Rate 84     Resp 18     Temp 97.9 F (36.6 C)     Temp Source Oral     SpO2 91  %     Weight      Height      Head Circumference      Peak Flow      Pain Score      Pain Loc      Pain Education      Exclude from Growth Chart    No data found.  Updated Vital Signs BP 130/74 (BP Location: Right Arm)   Pulse 84   Temp 97.9 F (36.6 C) (Oral)   Resp 18   SpO2 91%   Physical Exam Vitals and nursing note reviewed.  Constitutional:  Appearance: She is cachectic.     Comments: To name and location only (stated it was November 2024).  On oxygen  via nasal cannula from home.  HENT:     Head: Normocephalic.  Cardiovascular:     Rate and Rhythm: Normal rate and regular rhythm.  Pulmonary:     Effort: Pulmonary effort is normal.     Comments: Diminished breath sounds diffusely bilaterally. Neurological:     Mental Status: She is alert.     Sensory: No sensory deficit.     Gait: Gait normal.     Comments: Alert to name, location  Psychiatric:        Attention and Perception: Attention normal.        Behavior: Behavior is not agitated or aggressive. Behavior is cooperative.        Cognition and Memory: Cognition is impaired. Memory is impaired.    UC Treatments / Results  Labs (all labs ordered are listed, but only abnormal results are displayed) Labs Reviewed  POCT URINALYSIS DIP (MANUAL ENTRY) - Abnormal; Notable for the following components:      Result Value   Bilirubin, UA small (*)    Ketones, POC UA trace (5) (*)    Spec Grav, UA >=1.030 (*)    Blood, UA moderate (*)    Protein Ur, POC >=300 (*)    All other components within normal limits    EKG   Radiology No results found.  Procedures Procedures (including critical care time)  Medications Ordered in UC Medications - No data to display  Initial Impression / Assessment and Plan / UC Course  I have reviewed the triage vital signs and the nursing notes.  Pertinent labs & imaging results that were available during my care of the patient were reviewed by me and considered in my  medical decision making (see chart for details).     Patient is brought in for evaluation of the new onset of confusion and hallucinations.  Patient does not verbally acknowledge that she has been hearing or seeing things that do not belong.  Her urinalysis was not overwhelmingly convincing for UTI.  Based upon her age, medical history, and states of intermittent confusion-I do feel she needs further evaluation which unfortunately extends beyond the capabilities urgent care.  Through shared decision making, her family member is agreeable to transport her to the emergency department for ongoing care.  Final Clinical Impressions(s) / UC Diagnoses   Final diagnoses:  Altered mental status, unspecified altered mental status type     Discharge Instructions      Please proceed to the Emergency Department for further evaluation of this transient confusion.     ED Prescriptions   None    PDMP not reviewed this encounter.   Genene Kennel, FNP 07/24/23 1135    Genene Kennel, FNP 07/24/23 1135

## 2023-07-24 NOTE — ED Triage Notes (Addendum)
 Pt lives with son, son states pt started talking to people in her room last night that aren't there.  Son states this is not normal for pt. Pt denies pain at present but did state some burning with urination.

## 2023-07-31 ENCOUNTER — Inpatient Hospital Stay: Attending: Oncology | Admitting: Oncology

## 2023-07-31 ENCOUNTER — Encounter: Payer: Self-pay | Admitting: Oncology

## 2023-07-31 ENCOUNTER — Inpatient Hospital Stay

## 2023-07-31 VITALS — BP 144/59 | HR 92 | Temp 97.9°F | Resp 18 | Ht <= 58 in | Wt <= 1120 oz

## 2023-07-31 DIAGNOSIS — J439 Emphysema, unspecified: Secondary | ICD-10-CM

## 2023-07-31 DIAGNOSIS — Z85048 Personal history of other malignant neoplasm of rectum, rectosigmoid junction, and anus: Secondary | ICD-10-CM | POA: Diagnosis not present

## 2023-07-31 DIAGNOSIS — Z87891 Personal history of nicotine dependence: Secondary | ICD-10-CM | POA: Diagnosis not present

## 2023-07-31 DIAGNOSIS — Z90710 Acquired absence of both cervix and uterus: Secondary | ICD-10-CM

## 2023-07-31 DIAGNOSIS — Z79899 Other long term (current) drug therapy: Secondary | ICD-10-CM | POA: Diagnosis not present

## 2023-07-31 DIAGNOSIS — I700 Atherosclerosis of aorta: Secondary | ICD-10-CM | POA: Diagnosis not present

## 2023-07-31 DIAGNOSIS — D649 Anemia, unspecified: Secondary | ICD-10-CM

## 2023-07-31 DIAGNOSIS — Z9221 Personal history of antineoplastic chemotherapy: Secondary | ICD-10-CM | POA: Diagnosis not present

## 2023-07-31 DIAGNOSIS — Z79890 Hormone replacement therapy (postmenopausal): Secondary | ICD-10-CM | POA: Diagnosis not present

## 2023-07-31 DIAGNOSIS — C3412 Malignant neoplasm of upper lobe, left bronchus or lung: Secondary | ICD-10-CM | POA: Diagnosis present

## 2023-07-31 DIAGNOSIS — R911 Solitary pulmonary nodule: Secondary | ICD-10-CM | POA: Insufficient documentation

## 2023-07-31 DIAGNOSIS — G9389 Other specified disorders of brain: Secondary | ICD-10-CM

## 2023-07-31 DIAGNOSIS — Z886 Allergy status to analgesic agent status: Secondary | ICD-10-CM

## 2023-07-31 DIAGNOSIS — Z881 Allergy status to other antibiotic agents status: Secondary | ICD-10-CM

## 2023-07-31 DIAGNOSIS — Z9981 Dependence on supplemental oxygen: Secondary | ICD-10-CM

## 2023-07-31 DIAGNOSIS — R0689 Other abnormalities of breathing: Secondary | ICD-10-CM

## 2023-07-31 LAB — CBC WITH DIFFERENTIAL/PLATELET
Abs Immature Granulocytes: 0.02 10*3/uL (ref 0.00–0.07)
Basophils Absolute: 0 10*3/uL (ref 0.0–0.1)
Basophils Relative: 0 %
Eosinophils Absolute: 0.1 10*3/uL (ref 0.0–0.5)
Eosinophils Relative: 1 %
HCT: 34 % — ABNORMAL LOW (ref 36.0–46.0)
Hemoglobin: 10.8 g/dL — ABNORMAL LOW (ref 12.0–15.0)
Immature Granulocytes: 0 %
Lymphocytes Relative: 13 %
Lymphs Abs: 0.7 10*3/uL (ref 0.7–4.0)
MCH: 29.5 pg (ref 26.0–34.0)
MCHC: 31.8 g/dL (ref 30.0–36.0)
MCV: 92.9 fL (ref 80.0–100.0)
Monocytes Absolute: 0.3 10*3/uL (ref 0.1–1.0)
Monocytes Relative: 6 %
Neutro Abs: 4.5 10*3/uL (ref 1.7–7.7)
Neutrophils Relative %: 80 %
Platelets: 270 10*3/uL (ref 150–400)
RBC: 3.66 MIL/uL — ABNORMAL LOW (ref 3.87–5.11)
RDW: 12.6 % (ref 11.5–15.5)
WBC: 5.7 10*3/uL (ref 4.0–10.5)
nRBC: 0 % (ref 0.0–0.2)

## 2023-07-31 LAB — COMPREHENSIVE METABOLIC PANEL WITH GFR
ALT: 17 U/L (ref 0–44)
AST: 26 U/L (ref 15–41)
Albumin: 4.2 g/dL (ref 3.5–5.0)
Alkaline Phosphatase: 66 U/L (ref 38–126)
Anion gap: 9 (ref 5–15)
BUN: 16 mg/dL (ref 8–23)
CO2: 35 mmol/L — ABNORMAL HIGH (ref 22–32)
Calcium: 9.3 mg/dL (ref 8.9–10.3)
Chloride: 95 mmol/L — ABNORMAL LOW (ref 98–111)
Creatinine, Ser: 0.84 mg/dL (ref 0.44–1.00)
GFR, Estimated: >60 mL/min (ref >60)
Glucose, Bld: 110 mg/dL — ABNORMAL HIGH (ref 70–99)
Potassium: 4.5 mmol/L (ref 3.5–5.1)
Sodium: 139 mmol/L (ref 135–145)
Total Bilirubin: 0.4 mg/dL (ref 0.0–1.2)
Total Protein: 7.1 g/dL (ref 6.5–8.1)

## 2023-07-31 LAB — IRON AND TIBC
Iron: 48 ug/dL (ref 28–170)
Saturation Ratios: 13 % (ref 10.4–31.8)
TIBC: 374 ug/dL (ref 250–450)
UIBC: 326 ug/dL

## 2023-07-31 LAB — FERRITIN: Ferritin: 44 ng/mL (ref 11–307)

## 2023-07-31 LAB — FOLATE: Folate: 15.2 ng/mL (ref >5.9)

## 2023-07-31 LAB — VITAMIN B12: Vitamin B-12: 192 pg/mL (ref 180–914)

## 2023-07-31 NOTE — Assessment & Plan Note (Signed)
 Suspected new lung cancer based on smoking history and imaging.  I do not think patient is a candidate for EBUS with biopsy considering pneumothorax risk and poor PFTs.  Discussed with patient and family that we can obtain a PET scan and if it is a localized disease can consider radiation therapy.  If the disease is extensive, can discuss further options at that time.  At this time I do not think the patient is a candidate for chemotherapy or immunotherapy considering an ECOG of 3 Cannot obtain MRI of brain as patient is claustrophobic.  CT head recently showed no abnormalities.  - Order PET scan to assess for cancer and its extent. - Consider radiation therapy if PET scan shows localized lung cancer. - Discuss treatment options with her after PET scan results. - Can also consider obtaining a Guardant360 after the PET scan for confirmation of diagnosis  Return to clinic 1 week after PET scan

## 2023-07-31 NOTE — Assessment & Plan Note (Signed)
 Patient has normocytic anemia  - Will workup for nutritional deficiency including iron panel, ferritin, vitamin B12 and folate  Will discuss results at the next clinic visit and further management

## 2023-07-31 NOTE — Patient Instructions (Signed)
 VISIT SUMMARY:  Today, we discussed your recent health concerns, including a new lung nodule that may indicate lung cancer, your ongoing COPD management, and your anemia. We also reviewed your history of anal cancer and addressed your recent episodes of confusion.  YOUR PLAN:  -LUNG CANCER: You have a new lung nodule that may be lung cancer. Given your history of smoking and recent imaging, we are concerned about this possibility. We will not perform a biopsy due to the risk of lung collapse. Instead, we will schedule a PET scan to determine if the cancer is present and how extensive it is. If the cancer is localized, radiation therapy may be an option. We will discuss your treatment options after we have the PET scan results.  -ANEMIA: You have anemia, which means your blood has a lower than normal number of red blood cells. We will order blood tests to check for iron deficiency and other possible causes. If needed, we will start you on iron supplements.  -ANAL CANCER: Your anal cancer was treated with chemotherapy and radiation in 2013-2014, and there is no current evidence of recurrence. The lung findings are not related to your previous anal cancer.  -GOALS OF CARE: We understand that you are uncertain about pursuing treatment for the suspected lung cancer and prefer not to have surgery. Your decision on radiation therapy will depend on the PET scan results and further discussions about your goals and preferences. We will consider the impact of treatment on your quality of life and any logistical challenges.  INSTRUCTIONS:  1. Schedule a PET scan to assess the lung nodule and its extent. 2. After the PET scan, we will discuss the results and your treatment options. 3. We will order blood tests to evaluate your anemia and consider iron supplements if needed. 4. Continue your current COPD management with oxygen  therapy and breathing treatments. 5. Discuss your goals of care and treatment  preferences with your family after the PET scan results.

## 2023-07-31 NOTE — Progress Notes (Signed)
 Hematology-Oncology Clinic Note  Sara Bickers, MD   Reason for Referral: Left lung nodule  Oncology History: I have reviewed her chart and materials related to her cancer extensively and collaborated history with the patient. Summary of oncologic history is as follows:  Oncology History  Nodule of upper lobe of left lung  07/24/2023 Imaging   CT chest W contrast:  IMPRESSION: 1. There is a multilobulated at least 1.7 x 2.3 cm nodule in the left lung upper lobe abutting the pleura. There is smaller similar characteristic component along the superior aspect of the nodule. This is suspicious. Further evaluation with tissue sampling versus nuclear medicine PET-CT scan is recommended.   07/31/2023 Initial Diagnosis   Nodule of upper lobe of left lung       History of Presenting Illness: Sara Cochran 89 y.o. female is here for newly found left lung nodule.  Patient has a past medical history of anal cancer diagnosed in December 2014 and was treated with chemo RT with 5-FU/mitomycin .  Since then the patient has been in remission.  Patient also has a history of severe COPD and requires home oxygen  continuously.  Patient was recently brought to the emergency for altered mental status on 07/24/2023 and was found to have hypercarbia.  At this time a chest x-ray done showed left lung nodule which was later followed by CT scan which showed the same.  Patient is here today with her son and daughter-in-law to discuss the findings and further options. She is on continuous oxygen  therapy at 5 liters for COPD, experiencing severe shortness of breath and lacking endurance. Despite quitting smoking in 1998, she developed COPD, which contributes to her current respiratory issues. She is mostly sedentary, spending her days in a recliner.  She has no other complaints today apart from shortness of breath.  [name] does report moments of confusion.  She also stated that patient sleeps a lot during the day  and not much during the night.  He also reported that patient is claustrophobic.  Patient was a former smoker and quit in 1998.  Does not drink alcohol.  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   COPD (chronic obstructive pulmonary disease) (HCC)    High cholesterol    Hx of radiation therapy 02/23/13-04/06/13    rectal 50.4Gy/8fx   Hypertension    Migraine    Perirectal abscess    Weight loss    1 year 25 lbs    Surgical history: Past Surgical History:  Procedure Laterality Date   ABDOMINAL HYSTERECTOMY     CESAREAN SECTION     COLONOSCOPY W/ POLYPECTOMY  01/23/2001   polypoid colonic mucosa,no adenomatous change or malignancy identified   CYST REMOVAL HAND     3 removed from right hand   RECTAL BIOPSY  01/21/2013   invasive squamous cell ca,mod to poorly differentiatiated     Allergies:  is allergic to aspirin, erythromycin, and niacin and related.  Medications:  Current Outpatient Medications  Medication Sig Dispense Refill   albuterol  (PROVENTIL  HFA;VENTOLIN  HFA) 108 (90 BASE) MCG/ACT inhaler Inhale 2 puffs into the lungs every 6 (six) hours as needed for wheezing or shortness of breath.     ALPRAZolam  (XANAX ) 1 MG tablet Take 0.5 mg by mouth at bedtime as  needed for sleep.     amLODipine  (NORVASC ) 10 MG tablet Take 10 mg by mouth daily.     diphenoxylate -atropine  (LOMOTIL ) 2.5-0.025 MG per tablet Take 2 tablets by mouth 4 (four) times daily as needed for diarrhea or loose stools. 40 tablet 0   HYDROcodone -acetaminophen  (NORCO) 10-325 MG tablet Take 1 tablet by mouth every 6 (six) hours as needed.     ipratropium (ATROVENT ) 0.02 % nebulizer solution Take 0.5 mg by nebulization every 4 (four) hours as needed for wheezing or shortness of breath.     ipratropium-albuterol  (DUONEB) 0.5-2.5 (3) MG/3ML SOLN Take 3 mLs by  nebulization every 6 (six) hours as needed (shortness of breath).     levothyroxine (SYNTHROID, LEVOTHROID) 50 MCG tablet Take 50 mcg by mouth daily.     omeprazole (PRILOSEC) 20 MG capsule Take 20 mg by mouth daily.     OXYGEN  Inhale 4 L into the lungs continuous.     Peppermint Oil (IBGARD PO) Take by mouth.     pravastatin (PRAVACHOL) 40 MG tablet Take 40 mg by mouth daily.     No current facility-administered medications for this visit.    Review of Systems: Constitutional: Denies fevers, chills or abnormal night sweats Eyes: Denies blurriness of vision, double vision or watery eyes Ears, nose, mouth, throat, and face: Denies mucositis or sore throat Respiratory: Denies cough, dyspnea or wheezes Cardiovascular: Denies palpitation, chest discomfort or lower extremity swelling Gastrointestinal:  Denies nausea, heartburn or change in bowel habits Skin: Denies abnormal skin rashes Lymphatics: Denies new lymphadenopathy or easy bruising Neurological:Denies numbness, tingling or new weaknesses Behavioral/Psych: Mood is stable, no new changes  All other systems were reviewed with the patient and are negative.  Physical Examination: ECOG PERFORMANCE STATUS: 3 - Symptomatic, >50% confined to bed  Vitals:   07/31/23 1012  BP: (!) 144/59  Pulse: 92  Resp: 18  Temp: 97.9 F (36.6 C)  SpO2: 93%   Filed Weights   07/31/23 1012  Weight: 67 lb 10.9 oz (30.7 kg)    GENERAL: Frial, elderly female in a wheelchair SKIN: skin color, texture, turgor are normal, no rashes or significant lesions LUNGS: clear to auscultation and percussion with normal breathing effort HEART: regular rate & rhythm and no murmurs and no lower extremity edema ABDOMEN:abdomen soft, non-tender and normal bowel sounds Musculoskeletal:no cyanosis of digits and no clubbing  PSYCH: alert & oriented x 3 with fluent speech   Laboratory Data: I have reviewed the data as listed Lab Results  Component Value Date    WBC 5.7 07/31/2023   HGB 10.8 (L) 07/31/2023   HCT 34.0 (L) 07/31/2023   MCV 92.9 07/31/2023   PLT 270 07/31/2023   Recent Labs    07/24/23 1210  NA 136  K 4.3  CL 92*  CO2 33*  GLUCOSE 116*  BUN 23  CREATININE 0.99  CALCIUM  9.3  GFRNONAA 55*  PROT 7.0  ALBUMIN 4.4  AST 30  ALT 19  ALKPHOS 82  BILITOT 0.4    Radiographic Studies: I have personally reviewed the radiological images as listed and agreed with the findings in the report.  CT Chest W Contrast CLINICAL DATA:  lung mass. History of anal carcinoma. * Tracking Code: BO *  EXAM: CT CHEST WITH CONTRAST  TECHNIQUE: Multidetector CT imaging of the chest was performed during intravenous contrast administration.  RADIATION DOSE REDUCTION: This exam was performed according to the departmental dose-optimization program which includes automated exposure control, adjustment of the mA  and/or kV according to patient size and/or use of iterative reconstruction technique.  CONTRAST:  75mL OMNIPAQUE  IOHEXOL  300 MG/ML  SOLN  COMPARISON:  CT scan chest from 02/05/2013.  FINDINGS: Cardiovascular: Normal cardiac size. No pericardial effusion. No aortic aneurysm. There are coronary artery calcifications, in keeping with coronary artery disease. There are also moderate to severe peripheral atherosclerotic vascular calcifications of thoracic aorta and its major branches. There is a penetrating atherosclerotic ulcer arising from the left wall of the proximal descending thoracic aorta (series 2, image 47), measuring 5 x 13 mm.  Mediastinum/Nodes: Visualized thyroid  gland appears grossly unremarkable. No solid / cystic mediastinal masses. The esophagus is nondistended precluding optimal assessment. No axillary, mediastinal or hilar lymphadenopathy by size criteria.  Lungs/Pleura: The central tracheo-bronchial tree is patent. There are extensive emphysematous changes throughout bilateral lungs. There is a multilobulated  at least 1.7 x 2.3 cm nodule in the left lung upper lobe abutting the pleura. There is smaller similar characteristic component along the superior aspect of the nodule. There is adjacent small to medium emphysematous bulla. This is suspicious. Further evaluation with tissue sampling versus nuclear medicine PET-CT scan is recommended.  No lung consolidation, pleural effusion or pneumothorax.  Upper Abdomen: There is at least moderate sized sliding hiatal hernia. Remaining visualized upper abdominal viscera within normal limits.  Musculoskeletal: The visualized soft tissues of the chest wall are grossly unremarkable. No suspicious osseous lesions. There are moderate multilevel degenerative changes in the visualized spine. There are multiple age indeterminate compression deformities of thoracolumbar spine (T4, T7, T8, T10, T11 and L2 vertebrae). No significant retropulsion or spinal canal compromise.  IMPRESSION: 1. There is a multilobulated at least 1.7 x 2.3 cm nodule in the left lung upper lobe abutting the pleura. There is smaller similar characteristic component along the superior aspect of the nodule. This is suspicious. Further evaluation with tissue sampling versus nuclear medicine PET-CT scan is recommended. 2. Multiple age indeterminate thoracolumbar vertebral compression deformities, as described above. No significant retropulsion or spinal canal compromise. 3. Penetrating atherosclerotic ulcer arising from the left wall of the proximal descending thoracic aorta. 4. Multiple other nonacute observations, as described above.  Aortic Atherosclerosis (ICD10-I70.0) and Emphysema (ICD10-J43.9).  Electronically Signed   By: Beula Brunswick M.D.   On: 07/24/2023 15:17 DG Chest 2 View CLINICAL DATA:  Altered mental status.  EXAM: CHEST - 2 VIEW  COMPARISON:  Jul 25, 2022.  FINDINGS: The heart size and mediastinal contours are within normal limits. Left lower lobe nodular  density is noted concerning for possible neoplasm. Possible bilateral scarring or multifocal inflammation is noted. Multiple old thoracic compression fractures are noted.  IMPRESSION: Left lower lobe nodular density is noted concerning for possible neoplasm. CT scan of the chest is recommended for further evaluation.  Electronically Signed   By: Rosalene Colon M.D.   On: 07/24/2023 13:39 CT HEAD WO CONTRAST CLINICAL DATA:  Altered mental status. Confusion beginning last night.  EXAM: CT HEAD WITHOUT CONTRAST  TECHNIQUE: Contiguous axial images were obtained from the base of the skull through the vertex without intravenous contrast.  RADIATION DOSE REDUCTION: This exam was performed according to the departmental dose-optimization program which includes automated exposure control, adjustment of the mA and/or kV according to patient size and/or use of iterative reconstruction technique.  COMPARISON:  None Available.  FINDINGS: Brain: Age related volume loss. No evidence of old or acute focal infarction, mass lesion, hemorrhage, hydrocephalus or extra-axial collection.  Vascular: There is atherosclerotic calcification  of the major vessels at the base of the brain.  Skull: Negative  Sinuses/Orbits: Opacified right division of the sphenoid sinus. Other sinuses are clear. Orbits negative.  Other: None  IMPRESSION: 1. No acute CT finding. Age related volume loss. Atherosclerotic calcification of the major vessels at the base of the brain. 2. Opacified right division of the sphenoid sinus.  Electronically Signed   By: Bettylou Brunner M.D.   On: 07/24/2023 13:06    ASSESSMENT & PLAN:  Patient is an 89 year old female referred for newly found left lung nodule  Nodule of upper lobe of left lung Suspected new lung cancer based on smoking history and imaging.  I do not think patient is a candidate for EBUS with biopsy considering pneumothorax risk and poor PFTs.   Discussed with patient and family that we can obtain a PET scan and if it is a localized disease can consider radiation therapy.  If the disease is extensive, can discuss further options at that time.  At this time I do not think the patient is a candidate for chemotherapy or immunotherapy considering an ECOG of 3 Cannot obtain MRI of brain as patient is claustrophobic.  CT head recently showed no abnormalities.  - Order PET scan to assess for cancer and its extent. - Consider radiation therapy if PET scan shows localized lung cancer. - Discuss treatment options with her after PET scan results. - Can also consider obtaining a Guardant360 after the PET scan for confirmation of diagnosis  Return to clinic 1 week after PET scan  Anemia Patient has normocytic anemia  - Will workup for nutritional deficiency including iron panel, ferritin, vitamin B12 and folate  Will discuss results at the next clinic visit and further management   Orders Placed This Encounter  Procedures   NM PET Image Initial (PI) Skull Base To Thigh    Standing Status:   Future    Expected Date:   07/31/2023    Expiration Date:   07/30/2024    If indicated for the ordered procedure, I authorize the administration of a radiopharmaceutical per Radiology protocol:   Yes    Preferred imaging location?:   [facility]   CBC with Differential/Platelet    Standing Status:   Future    Number of Occurrences:   1    Expected Date:   07/31/2023    Expiration Date:   07/30/2024   Comprehensive metabolic panel with GFR    Standing Status:   Future    Number of Occurrences:   1    Expected Date:   07/31/2023    Expiration Date:   07/30/2024   Ferritin    Standing Status:   Future    Number of Occurrences:   1    Expected Date:   07/31/2023    Expiration Date:   07/30/2024   Folate    Standing Status:   Future    Number of Occurrences:   1    Expected Date:   07/31/2023    Expiration Date:   07/30/2024   Vitamin B12    Standing  Status:   Future    Number of Occurrences:   1    Expected Date:   07/31/2023    Expiration Date:   07/30/2024   Iron and TIBC    Standing Status:   Future    Number of Occurrences:   1    Expected Date:   07/31/2023    Expiration Date:   07/30/2024  The total time spent in the appointment was 60 minutes encounter with patients including review of chart and various tests results, discussions about plan of care and coordination of care plan   All questions were answered. The patient knows to call the clinic with any problems, questions or concerns. No barriers to learning was detected.  Eduardo Grade, MD 5/14/20251:50 PM

## 2023-08-08 ENCOUNTER — Ambulatory Visit (HOSPITAL_COMMUNITY)
Admission: RE | Admit: 2023-08-08 | Discharge: 2023-08-08 | Disposition: A | Discharge status: Home / Self Care | Source: Ambulatory Visit | Attending: Oncology

## 2023-08-08 ENCOUNTER — Ambulatory Visit (HOSPITAL_COMMUNITY): Admission: RE | Admit: 2023-08-08 | Source: Ambulatory Visit

## 2023-08-08 DIAGNOSIS — R911 Solitary pulmonary nodule: Secondary | ICD-10-CM | POA: Insufficient documentation

## 2023-08-08 MED ORDER — FLUDEOXYGLUCOSE F - 18 (FDG) INJECTION
5.3900 | Freq: Once | INTRAVENOUS | Status: AC | PRN
  Administered 2023-08-08: 5.39 via INTRAVENOUS

## 2023-08-14 ENCOUNTER — Encounter: Payer: Self-pay | Admitting: Oncology

## 2023-08-14 ENCOUNTER — Inpatient Hospital Stay (HOSPITAL_BASED_OUTPATIENT_CLINIC_OR_DEPARTMENT_OTHER): Admitting: Oncology

## 2023-08-14 ENCOUNTER — Inpatient Hospital Stay

## 2023-08-14 VITALS — BP 138/84 | HR 87 | Temp 97.8°F | Resp 19 | Wt <= 1120 oz

## 2023-08-14 DIAGNOSIS — R911 Solitary pulmonary nodule: Secondary | ICD-10-CM | POA: Diagnosis not present

## 2023-08-14 DIAGNOSIS — C3412 Malignant neoplasm of upper lobe, left bronchus or lung: Secondary | ICD-10-CM | POA: Diagnosis not present

## 2023-08-14 DIAGNOSIS — D649 Anemia, unspecified: Secondary | ICD-10-CM | POA: Diagnosis not present

## 2023-08-14 DIAGNOSIS — C210 Malignant neoplasm of anus, unspecified: Secondary | ICD-10-CM

## 2023-08-14 NOTE — Progress Notes (Unsigned)
 Patient Care Team: Omie Bickers, MD as PCP - General (Internal Medicine)  Clinic Day:  08/16/2023  Referring physician: Omie Bickers, MD   CHIEF COMPLAINT:  CC: Left lung nodule    ASSESSMENT & PLAN:   Assessment & Plan: Sara Cochran  is a 89 y.o. female with Left lung nodule   Anal cancer Patient has previous history of anal cancer treated with chemoradiation now in remission.  Nodule of upper lobe of left lung Suspected new lung cancer based on smoking history and imaging.  PET scan shows local disease with no evidence of metastasis Patient is not a candidate for MRI brain considering claustrophobia.  She did have recent CT head that was negative for metastasis I do not think patient is a candidate for EBUS with biopsy considering pneumothorax risk and poor PFTs.   At this time I do not think the patient is a candidate for chemotherapy or immunotherapy considering an ECOG of 3   - Will obtain Guardant360 today to aid with diagnosis - Radiation oncology referral for possible palliative radiation of the lung mass.  Patient is still thinking about if she wants to do this or not.  Patient had previous poor experience with anal radiation for anal cancer. - Discussed with patient that if she does not want radiation and chooses palliative route, we will refer to palliative care  Return to clinic 1 week after seeing radiation oncology  Anemia Patient has normal attic anemia. Labs reveal mild iron deficiency and vitamin B12 deficiency  - Start ferrous sulfate every other day.  Take MiraLAX for constipation - Start vitamin B12 orally daily.   The patient understands the plans discussed today and is in agreement with them.  She knows to contact our office if she develops concerns prior to her next appointment.  I provided 40 minutes of face-to-face time during this encounter and > 50% was spent counseling as documented under my assessment and plan.    Eduardo Grade, MD   [facility] - A DEPT OF [facility] [address] [place] [address] Dept: [phone] Dept Fax: [phone]   No orders of the defined types were placed in this encounter.    ONCOLOGY HISTORY:   Oncology History  Nodule of upper lobe of left lung  07/24/2023 Imaging   CT chest W contrast:  IMPRESSION: 1. There is a multilobulated at least 1.7 x 2.3 cm nodule in the left lung upper lobe abutting the pleura. There is smaller similar characteristic component along the superior aspect of the nodule. This is suspicious. Further evaluation with tissue sampling versus nuclear medicine PET-CT scan is recommended.   07/24/2023 Imaging   CTH wo Contrast:  IMPRESSION: 1. No acute CT finding. Age related volume loss. Atherosclerotic calcification of the major vessels at the base of the brain. 2. Opacified right division of the sphenoid sinus.   07/31/2023 Initial Diagnosis   Nodule of upper lobe of left lung   08/08/2023 PET scan   IMPRESSION: Left upper lobe mass lesion seen on prior CT scan is hypermetabolic and worrisome for a malignant lesion. No additional lung lesions or lymph node uptake.   Mild uptake along the soft tissue nodule anterior along the upper right extremity. Please correlate with clinical findings.   Mild uptake along the lateral aspect of the left sixth rib. Slight cortical deformity. Please correlate for injury or other process.   Advanced emphysematous  lung changes. Extensive vascular calcifications. Kyphotic spine with some compression deformities but no abnormal uptake.       Current Treatment:  TBD  INTERVAL HISTORY:  Sara Cochran is here today for follow up. Patient is accompanied by her son today.  We discussed that the PET scan did show localized lung mass in the left upper lobe but no other very obvious areas of involvement.  Patient is claustrophobic, that prevents her from getting an  MRI of brain.  We discussed that the only way to diagnose cancer is by doing a biopsy and that she is not eligible candidate for EBUS with biopsy considering her high oxygen  requirement and poor PFTs.  We also discussed that she could have radiation treatment done to her lung mass without a diagnosis.  Patient had previous radiation to her anal cancer and had poor experience from it and would like to think about this further.  I discussed with her that I would send a referral to radiation oncology and she can cancel if she does not want to do it anymore.  Son reported that patient does have occasional episodes of hallucinations and still continues to sleep more during the day and less during the night.  He reported no other complaints at this time.  I have reviewed the past medical history, past surgical history, social history and family history with the patient and they are unchanged from previous note.  ALLERGIES:  is allergic to aspirin, erythromycin, and niacin and related.  MEDICATIONS:  Current Outpatient Medications  Medication Sig Dispense Refill   albuterol  (PROVENTIL  HFA;VENTOLIN  HFA) 108 (90 BASE) MCG/ACT inhaler Inhale 2 puffs into the lungs every 6 (six) hours as needed for wheezing or shortness of breath.     ALPRAZolam  (XANAX ) 1 MG tablet Take 0.5 mg by mouth at bedtime as needed for sleep.     amLODipine  (NORVASC ) 10 MG tablet Take 10 mg by mouth daily.     diphenoxylate -atropine  (LOMOTIL ) 2.5-0.025 MG per tablet Take 2 tablets by mouth 4 (four) times daily as needed for diarrhea or loose stools. 40 tablet 0   HYDROcodone -acetaminophen  (NORCO) 10-325 MG tablet Take 1 tablet by mouth every 6 (six) hours as needed.     ipratropium (ATROVENT ) 0.02 % nebulizer solution Take 0.5 mg by nebulization every 4 (four) hours as needed for wheezing or shortness of breath.     ipratropium-albuterol  (DUONEB) 0.5-2.5 (3) MG/3ML SOLN Take 3 mLs by nebulization every 6 (six) hours as needed  (shortness of breath).     levothyroxine (SYNTHROID, LEVOTHROID) 50 MCG tablet Take 50 mcg by mouth daily.     omeprazole (PRILOSEC) 20 MG capsule Take 20 mg by mouth daily.     OXYGEN  Inhale 4 L into the lungs continuous.     Peppermint Oil (IBGARD PO) Take by mouth.     pravastatin (PRAVACHOL) 40 MG tablet Take 40 mg by mouth daily.     No current facility-administered medications for this visit.    REVIEW OF SYSTEMS:   Constitutional: Denies fevers, chills or abnormal weight loss Eyes: Denies blurriness of vision Ears, nose, mouth, throat, and face: Denies mucositis or sore throat Respiratory: Denies cough, dyspnea or wheezes Cardiovascular: Denies palpitation, chest discomfort or lower extremity swelling Gastrointestinal:  Denies nausea, heartburn or change in bowel habits Skin: Denies abnormal skin rashes Lymphatics: Denies new lymphadenopathy or easy bruising Neurological:Denies numbness, tingling or new weaknesses Behavioral/Psych: Mood is stable, no new changes  All other systems were reviewed  with the patient and are negative.   VITALS:   Blood pressure 138/84, pulse 87, temperature 97.8 F (36.6 C), temperature source Tympanic, resp. rate 19, weight 65 lb 14 oz (29.9 kg), SpO2 94%.  Wt Readings from Last 3 Encounters:  08/14/23 65 lb 14 oz (29.9 kg)  07/31/23 67 lb 10.9 oz (30.7 kg)  07/24/23 65 lb 8 oz (29.7 kg)    Body mass index is 13.77 kg/m.  Performance status (ECOG): 3 - Symptomatic, >50% confined to bed  PHYSICAL EXAM:   GENERAL: [place], elderly female in a wheelchair SKIN: skin color, texture, turgor are normal, no rashes or significant lesions LUNGS: clear to auscultation and percussion with normal breathing effort HEART: regular rate & rhythm and no murmurs and no lower extremity edema ABDOMEN:abdomen soft, non-tender and normal bowel sounds Musculoskeletal:no cyanosis of digits and no clubbing  PSYCH: alert & oriented x 3 with fluent  speech  LABORATORY DATA:  I have reviewed the data as listed    Component Value Date/Time   NA 139 07/31/2023 1048   NA 143 06/08/2013 1020   K 4.5 07/31/2023 1048   K 3.4 (L) 06/08/2013 1020   CL 95 (L) 07/31/2023 1048   CO2 35 (H) 07/31/2023 1048   CO2 30 (H) 06/08/2013 1020   GLUCOSE 110 (H) 07/31/2023 1048   GLUCOSE 99 06/08/2013 1020   BUN 16 07/31/2023 1048   BUN 8.8 06/08/2013 1020   CREATININE 0.84 07/31/2023 1048   CREATININE 0.5 (L) 06/08/2013 1020   CALCIUM  9.3 07/31/2023 1048   CALCIUM  8.4 06/08/2013 1020   PROT 7.1 07/31/2023 1048   PROT 6.0 (L) 06/08/2013 1020   ALBUMIN 4.2 07/31/2023 1048   ALBUMIN 2.9 (L) 06/08/2013 1020   AST 26 07/31/2023 1048   AST 14 06/08/2013 1020   ALT 17 07/31/2023 1048   ALT 10 06/08/2013 1020   ALKPHOS 66 07/31/2023 1048   ALKPHOS 52 06/08/2013 1020   BILITOT 0.4 07/31/2023 1048   BILITOT 0.22 06/08/2013 1020   GFRNONAA >60 07/31/2023 1048   GFRAA >90 05/01/2013 0540    Lab Results  Component Value Date   WBC 5.7 07/31/2023   NEUTROABS 4.5 07/31/2023   HGB 10.8 (L) 07/31/2023   HCT 34.0 (L) 07/31/2023   MCV 92.9 07/31/2023   PLT 270 07/31/2023      Chemistry      Component Value Date/Time   NA 139 07/31/2023 1048   NA 143 06/08/2013 1020   K 4.5 07/31/2023 1048   K 3.4 (L) 06/08/2013 1020   CL 95 (L) 07/31/2023 1048   CO2 35 (H) 07/31/2023 1048   CO2 30 (H) 06/08/2013 1020   BUN 16 07/31/2023 1048   BUN 8.8 06/08/2013 1020   CREATININE 0.84 07/31/2023 1048   CREATININE 0.5 (L) 06/08/2013 1020      Component Value Date/Time   CALCIUM  9.3 07/31/2023 1048   CALCIUM  8.4 06/08/2013 1020   ALKPHOS 66 07/31/2023 1048   ALKPHOS 52 06/08/2013 1020   AST 26 07/31/2023 1048   AST 14 06/08/2013 1020   ALT 17 07/31/2023 1048   ALT 10 06/08/2013 1020   BILITOT 0.4 07/31/2023 1048   BILITOT 0.22 06/08/2013 1020      Latest Reference Range & Units 07/31/23 10:47 07/31/23 10:48  Iron 28 - 170 ug/dL 48   UIBC ug/dL  161   TIBC 096 - 045 ug/dL 409   Saturation Ratios 10.4 - 31.8 % 13   Ferritin 11 -  307 ng/mL 44   Folate >5.9 ng/mL  15.2  Vitamin B12 180 - 914 pg/mL 192     RADIOGRAPHIC STUDIES: I have personally reviewed the radiological images as listed and agreed with the findings in the report.  NM PET Image Initial (PI) Skull Base To Thigh Result Date: 08/12/2023 CLINICAL DATA:  Initial treatment strategy for lung nodule. Remote history of anal carcinoma EXAM: NUCLEAR MEDICINE PET SKULL BASE TO THIGH TECHNIQUE: 5.39 mCi F-18 FDG was injected intravenously. Full-ring PET imaging was performed from the skull base to thigh after the radiotracer. CT data was obtained and used for attenuation correction and anatomic localization. Fasting blood glucose: 146 mg/dl COMPARISON:  Next chest CT with contrast 07/24/2023 and older. Old PET-CT 02/05/2013 FINDINGS: Mediastinal blood pool activity: SUV max 1.6 Liver activity: SUV max 2.0 NECK: No specific abnormal uptake identified in the neck including along lymph node change of the submandibular, posterior triangle or internal jugular region. There is symmetric uptake of the visualized intracranial compartment. There is some strap muscle uptake in the neck, likely physiologic. Incidental CT findings: Paranasal sinuses and mastoid air cells are clear. Only opacification seen of the right sphenoid paranasal sinus. Prominent vascular calcifications. The parotid glands, submandibular glands are unremarkable. Small thyroid  gland. CHEST: The juxtapleural left upper lobe mass lesion seen on recent CT scan measuring 2.3 x 1.7 cm is hypermetabolic with maximum SUV value of 8.0. This is worrisome for a malignant lesion. Just above this is some patchy uptake along a cavitary focus which has some itself wall thickening. These areas of wall thickening have some mild uptake of maximum SUV 1.6. No additional areas of pathologic abnormal uptake along the lung parenchyma. Minimal uptake along  the bandlike scarring ER at type changes in the anterior right upper lung. No abnormal uptake above blood pool in the axillary regions, hilum or mediastinum. Incidental CT findings: Advanced emphysematous lung changes identified. No pleural effusion or pneumothorax. Motion. Diffuse vascular calcifications identified. Heart is nonenlarged. Moderate hiatal hernia. ABDOMEN/PELVIS: There is physiologic distribution radiotracer along the parenchymal organs, bowel and renal collecting systems. Incidental CT findings: Extensive vascular calcifications diffusely. Punctate splenic and hepatic granulomas. No obstructing renal or ureteral stones. Gallbladder is nondilated. Grossly the bowel is nondilated. Scattered colonic stool. SKELETON: There is nodular area of soft tissue seen along the anterior aspect of the right upper extremity on image 38 which has some uptake with maximum SUV of 3.1. this focus on image 38 measures 12 mm. Please correlate with clinical findings. There is some mild uptake along the anterior aspect of the left sixth rib with slight cortical deformity. This could be a healing fracture. Maximum SUV of 2.4. No additional areas of abnormal osseous uptake. Incidental CT findings: Kyphotic appearance of the spine with degenerative changes and areas of spinal compression deformity which are without abnormal uptake. Severe osteopenia. This curvature of the spine as well. IMPRESSION: Left upper lobe mass lesion seen on prior CT scan is hypermetabolic and worrisome for a malignant lesion. No additional lung lesions or lymph node uptake. Mild uptake along the soft tissue nodule anterior along the upper right extremity. Please correlate with clinical findings. Mild uptake along the lateral aspect of the left sixth rib. Slight cortical deformity. Please correlate for injury or other process. Advanced emphysematous lung changes. Extensive vascular calcifications. Kyphotic spine with some compression deformities but  no abnormal uptake. Electronically Signed   By: Adrianna Horde M.D.   On: 08/12/2023 18:06   CT Chest  W Contrast Result Date: 07/24/2023 CLINICAL DATA:  lung mass. History of anal carcinoma. * Tracking Code: BO * EXAM: CT CHEST WITH CONTRAST TECHNIQUE: Multidetector CT imaging of the chest was performed during intravenous contrast administration. RADIATION DOSE REDUCTION: This exam was performed according to the departmental dose-optimization program which includes automated exposure control, adjustment of the mA and/or kV according to patient size and/or use of iterative reconstruction technique. CONTRAST:  75mL OMNIPAQUE  IOHEXOL  300 MG/ML  SOLN COMPARISON:  CT scan chest from 02/05/2013. FINDINGS: Cardiovascular: Normal cardiac size. No pericardial effusion. No aortic aneurysm. There are coronary artery calcifications, in keeping with coronary artery disease. There are also moderate to severe peripheral atherosclerotic vascular calcifications of thoracic aorta and its major branches. There is a penetrating atherosclerotic ulcer arising from the left wall of the proximal descending thoracic aorta (series 2, image 47), measuring 5 x 13 mm. Mediastinum/Nodes: Visualized thyroid  gland appears grossly unremarkable. No solid / cystic mediastinal masses. The esophagus is nondistended precluding optimal assessment. No axillary, mediastinal or hilar lymphadenopathy by size criteria. Lungs/Pleura: The central tracheo-bronchial tree is patent. There are extensive emphysematous changes throughout bilateral lungs. There is a multilobulated at least 1.7 x 2.3 cm nodule in the left lung upper lobe abutting the pleura. There is smaller similar characteristic component along the superior aspect of the nodule. There is adjacent small to medium emphysematous bulla. This is suspicious. Further evaluation with tissue sampling versus nuclear medicine PET-CT scan is recommended. No lung consolidation, pleural effusion or pneumothorax.  Upper Abdomen: There is at least moderate sized sliding hiatal hernia. Remaining visualized upper abdominal viscera within normal limits. Musculoskeletal: The visualized soft tissues of the chest wall are grossly unremarkable. No suspicious osseous lesions. There are moderate multilevel degenerative changes in the visualized spine. There are multiple age indeterminate compression deformities of thoracolumbar spine (T4, T7, T8, T10, T11 and L2 vertebrae). No significant retropulsion or spinal canal compromise. IMPRESSION: 1. There is a multilobulated at least 1.7 x 2.3 cm nodule in the left lung upper lobe abutting the pleura. There is smaller similar characteristic component along the superior aspect of the nodule. This is suspicious. Further evaluation with tissue sampling versus nuclear medicine PET-CT scan is recommended. 2. Multiple age indeterminate thoracolumbar vertebral compression deformities, as described above. No significant retropulsion or spinal canal compromise. 3. Penetrating atherosclerotic ulcer arising from the left wall of the proximal descending thoracic aorta. 4. Multiple other nonacute observations, as described above. Aortic Atherosclerosis (ICD10-I70.0) and Emphysema (ICD10-J43.9). Electronically Signed   By: Beula Brunswick M.D.   On: 07/24/2023 15:17   DG Chest 2 View Result Date: 07/24/2023 CLINICAL DATA:  Altered mental status. EXAM: CHEST - 2 VIEW COMPARISON:  Jul 25, 2022. FINDINGS: The heart size and mediastinal contours are within normal limits. Left lower lobe nodular density is noted concerning for possible neoplasm. Possible bilateral scarring or multifocal inflammation is noted. Multiple old thoracic compression fractures are noted. IMPRESSION: Left lower lobe nodular density is noted concerning for possible neoplasm. CT scan of the chest is recommended for further evaluation. Electronically Signed   By: Rosalene Colon M.D.   On: 07/24/2023 13:39   CT HEAD WO CONTRAST Result  Date: 07/24/2023 CLINICAL DATA:  Altered mental status. Confusion beginning last night. EXAM: CT HEAD WITHOUT CONTRAST TECHNIQUE: Contiguous axial images were obtained from the base of the skull through the vertex without intravenous contrast. RADIATION DOSE REDUCTION: This exam was performed according to the departmental dose-optimization program which includes automated exposure  control, adjustment of the mA and/or kV according to patient size and/or use of iterative reconstruction technique. COMPARISON:  None Available. FINDINGS: Brain: Age related volume loss. No evidence of old or acute focal infarction, mass lesion, hemorrhage, hydrocephalus or extra-axial collection. Vascular: There is atherosclerotic calcification of the major vessels at the base of the brain. Skull: Negative Sinuses/Orbits: Opacified right division of the sphenoid sinus. Other sinuses are clear. Orbits negative. Other: None IMPRESSION: 1. No acute CT finding. Age related volume loss. Atherosclerotic calcification of the major vessels at the base of the brain. 2. Opacified right division of the sphenoid sinus. Electronically Signed   By: Bettylou Brunner M.D.   On: 07/24/2023 13:06

## 2023-08-14 NOTE — Patient Instructions (Signed)
 VISIT SUMMARY:  During today's visit, we discussed your lung cancer diagnosis and potential treatment options. You were accompanied by your son. We also talked about your claustrophobia, sleep disturbances, and overall goals of care.  YOUR PLAN:  -LUNG CANCER: Lung cancer is a type of cancer that begins in the lungs. Your cancer is localized, meaning it has not spread to other parts of your body. Due to your age and other health conditions, aggressive treatments are not recommended. We will refer you to a radiation oncologist in [place] to evaluate if radiation therapy is a suitable option for you. You can decide whether or not to proceed with radiation therapy based on your comfort and quality of life.   -GOALS OF CARE: We discussed your personal values and quality of life in making decisions about your treatment. It is important that any treatment aligns with your goals and preferences. We support any decision you make regarding radiation therapy.  INSTRUCTIONS:  You will be referred to a radiation oncologist in [place] to discuss the possibility of radiation therapy for your lung cancer. If you decide against radiation therapy, please let us  know so we can cancel the referral.

## 2023-08-16 NOTE — Assessment & Plan Note (Signed)
 Patient has previous history of anal cancer treated with chemoradiation now in remission.

## 2023-08-16 NOTE — Assessment & Plan Note (Signed)
 Patient has normal attic anemia. Labs reveal mild iron deficiency and vitamin B12 deficiency  - Start ferrous sulfate every other day.  Take MiraLAX for constipation - Start vitamin B12 orally daily.

## 2023-08-16 NOTE — Assessment & Plan Note (Signed)
 Suspected new lung cancer based on smoking history and imaging.  PET scan shows local disease with no evidence of metastasis Patient is not a candidate for MRI brain considering claustrophobia.  She did have recent CT head that was negative for metastasis I do not think patient is a candidate for EBUS with biopsy considering pneumothorax risk and poor PFTs.   At this time I do not think the patient is a candidate for chemotherapy or immunotherapy considering an ECOG of 3   - Will obtain Guardant360 today to aid with diagnosis - Radiation oncology referral for possible palliative radiation of the lung mass.  Patient is still thinking about if she wants to do this or not.  Patient had previous poor experience with anal radiation for anal cancer. - Discussed with patient that if she does not want radiation and chooses palliative route, we will refer to palliative care  Return to clinic 1 week after seeing radiation oncology

## 2023-08-22 ENCOUNTER — Inpatient Hospital Stay (HOSPITAL_COMMUNITY): Admission: RE | Admit: 2023-08-22 | Source: Ambulatory Visit

## 2023-08-29 ENCOUNTER — Inpatient Hospital Stay: Admitting: Oncology

## 2023-09-02 ENCOUNTER — Encounter: Payer: Self-pay | Admitting: Surgery

## 2023-09-02 ENCOUNTER — Ambulatory Visit: Attending: Surgery | Admitting: Surgery

## 2023-09-02 ENCOUNTER — Inpatient Hospital Stay: Admitting: Dietician

## 2023-09-02 VITALS — BP 120/76 | HR 94 | Temp 97.7°F | Resp 87

## 2023-09-02 DIAGNOSIS — I7123 Aneurysm of the descending thoracic aorta, without rupture: Secondary | ICD-10-CM | POA: Diagnosis present

## 2023-09-02 NOTE — Progress Notes (Signed)
 Vascular and Vein Specialist of [facility]  Patient name: Sara Cochran MRN: [id number] DOB: 13-Mar-1934 Sex: female   REQUESTING PROVIDER:   ER   REASON FOR CONSULT:    Aortic ulcer  HISTORY OF PRESENT ILLNESS:   Sara Cochran is a 89 y.o. female, who presented to the emergency department in May 2025 with some visual hallucinations.  She did not have any obvious infectious symptoms to explain her delirium a chest x-ray showed a lung mass and so a CT scan was ordered.  She was found to have a small penetrating aortic ulcer up near the left subclavian artery.   The patient has chronic lung disease on oxygen .  She has been diagnosed with lung cancer.  She is refusing chemotherapy.  She is severely malnourished.   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   COPD (chronic obstructive pulmonary disease) (HCC)    High cholesterol    Hx of radiation therapy 02/23/13-04/06/13    rectal 50.4Gy/33fx   Hypertension    Migraine    Perirectal abscess    Weight loss    1 year 25 lbs     FAMILY HISTORY   Family History  Problem Relation Age of Onset   Heart disease Mother    Stroke Mother    Cancer Sister        brain    SOCIAL HISTORY:   Social History   Socioeconomic History   Marital status: Widowed    Spouse name: Not on file   Number of children: Not on file   Years of education: Not on file   Highest education level: Not on file  Occupational History   Not on file  Tobacco Use   Smoking status: Former    Current packs/day: 0.00    Types: Cigarettes    Quit date: 05/16/1996    Years since quitting: 27.3   Smokeless tobacco: Never  Substance and Sexual Activity   Alcohol use: No   Drug use: No   Sexual activity: Never  Other Topics Concern   Not on file  Social History Narrative   Widowed  since Nov 2013   Lives with her son, Gene Stanton Earthly & his family   Independent ADL's-drives   Stepdaughter-Marissa is close to her   12-04-17 Unable to ask abuse questions family member with her today.   Social Drivers of Corporate investment banker Strain: Not on file  Food Insecurity: Not on file  Transportation Needs: Not on file  Physical Activity: Not on file  Stress: Not on file  Social Connections: Not on file  Intimate Partner Violence: Not on file    ALLERGIES:    Allergies  Allergen Reactions   Aspirin Shortness Of Breath and Swelling    Other Reaction(s): Unknown   Erythromycin Hives and Rash    Other Reaction(s): Unknown   Niacin And Related Other (See Comments)    Redness all over     CURRENT MEDICATIONS:    Current Outpatient Medications  Medication Sig Dispense Refill   albuterol  (PROVENTIL  HFA;VENTOLIN  HFA) 108 (90 BASE) MCG/ACT inhaler Inhale 2 puffs into the lungs every 6 (six) hours as needed for wheezing or shortness of breath.     ALPRAZolam  (XANAX ) 1 MG tablet Take 0.5 mg by mouth at bedtime  as needed for sleep.     amLODipine  (NORVASC ) 10 MG tablet Take 10 mg by mouth daily.     diphenoxylate -atropine  (LOMOTIL ) 2.5-0.025 MG per tablet Take 2 tablets by mouth 4 (four) times daily as needed for diarrhea or loose stools. 40 tablet 0   HYDROcodone -acetaminophen  (NORCO) 10-325 MG tablet Take 1 tablet by mouth every 6 (six) hours as needed.     ipratropium (ATROVENT ) 0.02 % nebulizer solution Take 0.5 mg by nebulization every 4 (four) hours as needed for wheezing or shortness of breath.     ipratropium-albuterol  (DUONEB) 0.5-2.5 (3) MG/3ML SOLN Take 3 mLs by nebulization every 6 (six) hours as needed (shortness of breath).     levothyroxine (SYNTHROID, LEVOTHROID) 50 MCG tablet Take 50 mcg by mouth daily.     omeprazole (PRILOSEC) 20 MG capsule Take 20 mg by mouth daily.     OXYGEN  Inhale 4 L into the lungs continuous.     Peppermint Oil (IBGARD PO) Take  by mouth.     pravastatin (PRAVACHOL) 40 MG tablet Take 40 mg by mouth daily.     No current facility-administered medications for this visit.    REVIEW OF SYSTEMS:   [X]  denotes positive finding, [ ]  denotes negative finding Cardiac  Comments:  Chest pain or chest pressure:    Shortness of breath upon exertion:    Short of breath when lying flat:    Irregular heart rhythm:        Vascular    Pain in calf, thigh, or hip brought on by ambulation:    Pain in feet at night that wakes you up from your sleep:     Blood clot in your veins:    Leg swelling:         Pulmonary    Oxygen  at home: x   Productive cough:     Wheezing:         Neurologic    Sudden weakness in arms or legs:     Sudden numbness in arms or legs:     Sudden onset of difficulty speaking or slurred speech:    Temporary loss of vision in one eye:     Problems with dizziness:         Gastrointestinal    Blood in stool:      Vomited blood:         Genitourinary    Burning when urinating:     Blood in urine:        Psychiatric    Major depression:         Hematologic    Bleeding problems:    Problems with blood clotting too easily:        Skin    Rashes or ulcers:        Constitutional    Fever or chills:     PHYSICAL EXAM:   There were no vitals filed for this visit.  GENERAL: The patient frail female in no obvious distress  CARDIAC: There is a regular rate and rhythm.  PULMONARY: Nonlabored respirations ABDOMEN: Soft and non-tender  MUSCULOSKELETAL: There are no major deformities or cyanosis. NEUROLOGIC: No focal weakness or paresthesias are detected. SKIN: There are no ulcers or rashes noted. PSYCHIATRIC: The patient has a normal affect.  STUDIES:   I have reviewed her CT scan with the following findings: 1. There is a multilobulated at least 1.7 x 2.3 cm nodule in the left lung upper lobe abutting the pleura. There is smaller  similar characteristic component along the superior  aspect of the nodule. This is suspicious. Further evaluation with tissue sampling versus nuclear medicine PET-CT scan is recommended. 2. Multiple age indeterminate thoracolumbar vertebral compression deformities, as described above. No significant retropulsion or spinal canal compromise. 3. There is a penetrating atherosclerotic ulcer arising from the left wall of the proximal descending thoracic aorta (series 2, image 47), measuring 5 x 13 mm. 4. Multiple other nonacute observations, as described above.  ASSESSMENT and PLAN   Aortic penetrating ulcer: This is very small maximum diameter is 5 mm.  The patient due to her age and frailness and other comorbidities is not a candidate for any intervention.  In addition, this lesion does not meet size criteria for repair.  I do not feel that we need to follow this.  She can follow-up with me on an as-needed basis   Marti Slates, MD, FACS [facility] and Vein Specialists of [facility] [phone] Pager [phone]

## 2024-04-14 ENCOUNTER — Encounter (HOSPITAL_COMMUNITY): Payer: Self-pay | Admitting: *Deleted

## 2024-04-14 ENCOUNTER — Inpatient Hospital Stay (HOSPITAL_COMMUNITY)
Admission: EM | Admit: 2024-04-14 | Discharge: 2024-04-16 | DRG: 291 | Disposition: A | Discharge status: Hospice | Attending: Internal Medicine | Admitting: Internal Medicine

## 2024-04-14 ENCOUNTER — Emergency Department (HOSPITAL_COMMUNITY)

## 2024-04-14 ENCOUNTER — Other Ambulatory Visit: Payer: Self-pay

## 2024-04-14 DIAGNOSIS — I5021 Acute systolic (congestive) heart failure: Secondary | ICD-10-CM | POA: Diagnosis not present

## 2024-04-14 DIAGNOSIS — K219 Gastro-esophageal reflux disease without esophagitis: Secondary | ICD-10-CM | POA: Diagnosis present

## 2024-04-14 DIAGNOSIS — E039 Hypothyroidism, unspecified: Secondary | ICD-10-CM | POA: Diagnosis present

## 2024-04-14 DIAGNOSIS — Z923 Personal history of irradiation: Secondary | ICD-10-CM

## 2024-04-14 DIAGNOSIS — Z801 Family history of malignant neoplasm of trachea, bronchus and lung: Secondary | ICD-10-CM

## 2024-04-14 DIAGNOSIS — I110 Hypertensive heart disease with heart failure: Principal | ICD-10-CM | POA: Diagnosis present

## 2024-04-14 DIAGNOSIS — Z87891 Personal history of nicotine dependence: Secondary | ICD-10-CM

## 2024-04-14 DIAGNOSIS — E7800 Pure hypercholesterolemia, unspecified: Secondary | ICD-10-CM | POA: Diagnosis present

## 2024-04-14 DIAGNOSIS — Z8249 Family history of ischemic heart disease and other diseases of the circulatory system: Secondary | ICD-10-CM | POA: Diagnosis not present

## 2024-04-14 DIAGNOSIS — I5033 Acute on chronic diastolic (congestive) heart failure: Secondary | ICD-10-CM | POA: Diagnosis present

## 2024-04-14 DIAGNOSIS — D630 Anemia in neoplastic disease: Secondary | ICD-10-CM | POA: Diagnosis present

## 2024-04-14 DIAGNOSIS — F419 Anxiety disorder, unspecified: Secondary | ICD-10-CM | POA: Diagnosis present

## 2024-04-14 DIAGNOSIS — R64 Cachexia: Secondary | ICD-10-CM | POA: Diagnosis present

## 2024-04-14 DIAGNOSIS — Z85048 Personal history of other malignant neoplasm of rectum, rectosigmoid junction, and anus: Secondary | ICD-10-CM | POA: Diagnosis not present

## 2024-04-14 DIAGNOSIS — Z66 Do not resuscitate: Secondary | ICD-10-CM | POA: Diagnosis present

## 2024-04-14 DIAGNOSIS — E870 Hyperosmolality and hypernatremia: Secondary | ICD-10-CM | POA: Diagnosis present

## 2024-04-14 DIAGNOSIS — C3490 Malignant neoplasm of unspecified part of unspecified bronchus or lung: Secondary | ICD-10-CM | POA: Diagnosis present

## 2024-04-14 DIAGNOSIS — Z888 Allergy status to other drugs, medicaments and biological substances status: Secondary | ICD-10-CM

## 2024-04-14 DIAGNOSIS — J9621 Acute and chronic respiratory failure with hypoxia: Secondary | ICD-10-CM | POA: Diagnosis present

## 2024-04-14 DIAGNOSIS — Z79899 Other long term (current) drug therapy: Secondary | ICD-10-CM | POA: Diagnosis not present

## 2024-04-14 DIAGNOSIS — N179 Acute kidney failure, unspecified: Secondary | ICD-10-CM | POA: Diagnosis present

## 2024-04-14 DIAGNOSIS — C3492 Malignant neoplasm of unspecified part of left bronchus or lung: Secondary | ICD-10-CM | POA: Diagnosis present

## 2024-04-14 DIAGNOSIS — J439 Emphysema, unspecified: Secondary | ICD-10-CM | POA: Diagnosis present

## 2024-04-14 DIAGNOSIS — I509 Heart failure, unspecified: Principal | ICD-10-CM

## 2024-04-14 DIAGNOSIS — H9190 Unspecified hearing loss, unspecified ear: Secondary | ICD-10-CM | POA: Diagnosis present

## 2024-04-14 DIAGNOSIS — Z9981 Dependence on supplemental oxygen: Secondary | ICD-10-CM

## 2024-04-14 DIAGNOSIS — Z681 Body mass index (BMI) 19 or less, adult: Secondary | ICD-10-CM

## 2024-04-14 DIAGNOSIS — Z886 Allergy status to analgesic agent status: Secondary | ICD-10-CM

## 2024-04-14 DIAGNOSIS — Z881 Allergy status to other antibiotic agents status: Secondary | ICD-10-CM

## 2024-04-14 DIAGNOSIS — Z79890 Hormone replacement therapy (postmenopausal): Secondary | ICD-10-CM

## 2024-04-14 DIAGNOSIS — Z90710 Acquired absence of both cervix and uterus: Secondary | ICD-10-CM

## 2024-04-14 LAB — CBC WITH DIFFERENTIAL/PLATELET
Abs Immature Granulocytes: 0.05 10*3/uL (ref 0.00–0.07)
Basophils Absolute: 0 10*3/uL (ref 0.0–0.1)
Basophils Relative: 0 %
Eosinophils Absolute: 0 10*3/uL (ref 0.0–0.5)
Eosinophils Relative: 1 %
HCT: 31 % — ABNORMAL LOW (ref 36.0–46.0)
Hemoglobin: 9 g/dL — ABNORMAL LOW (ref 12.0–15.0)
Immature Granulocytes: 1 %
Lymphocytes Relative: 10 %
Lymphs Abs: 0.8 10*3/uL (ref 0.7–4.0)
MCH: 27.6 pg (ref 26.0–34.0)
MCHC: 29 g/dL — ABNORMAL LOW (ref 30.0–36.0)
MCV: 95.1 fL (ref 80.0–100.0)
Monocytes Absolute: 0.3 10*3/uL (ref 0.1–1.0)
Monocytes Relative: 5 %
Neutro Abs: 6.1 10*3/uL (ref 1.7–7.7)
Neutrophils Relative %: 83 %
Platelets: 267 10*3/uL (ref 150–400)
RBC: 3.26 MIL/uL — ABNORMAL LOW (ref 3.87–5.11)
RDW: 13.5 % (ref 11.5–15.5)
WBC: 7.4 10*3/uL (ref 4.0–10.5)
nRBC: 0 % (ref 0.0–0.2)

## 2024-04-14 LAB — BASIC METABOLIC PANEL WITH GFR
Anion gap: 12 (ref 5–15)
BUN: 28 mg/dL — ABNORMAL HIGH (ref 8–23)
CO2: 37 mmol/L — ABNORMAL HIGH (ref 22–32)
Calcium: 8.8 mg/dL — ABNORMAL LOW (ref 8.9–10.3)
Chloride: 96 mmol/L — ABNORMAL LOW (ref 98–111)
Creatinine, Ser: 1.41 mg/dL — ABNORMAL HIGH (ref 0.44–1.00)
GFR, Estimated: 36 mL/min — ABNORMAL LOW
Glucose, Bld: 86 mg/dL (ref 70–99)
Potassium: 4.5 mmol/L (ref 3.5–5.1)
Sodium: 145 mmol/L (ref 135–145)

## 2024-04-14 LAB — PRO BRAIN NATRIURETIC PEPTIDE: Pro Brain Natriuretic Peptide: 1339 pg/mL — ABNORMAL HIGH

## 2024-04-14 LAB — TROPONIN T, HIGH SENSITIVITY
Troponin T High Sensitivity: 80 ng/L — ABNORMAL HIGH (ref 0–19)
Troponin T High Sensitivity: 75 ng/L — ABNORMAL HIGH (ref 0–19)

## 2024-04-14 MED ORDER — PRAVASTATIN SODIUM 40 MG PO TABS
40.0000 mg | ORAL_TABLET | Freq: Every day | ORAL | Status: DC
  Administered 2024-04-14 – 2024-04-15 (×2): 40 mg via ORAL
  Filled 2024-04-14 (×2): qty 1

## 2024-04-14 MED ORDER — IPRATROPIUM-ALBUTEROL 0.5-2.5 (3) MG/3ML IN SOLN: 3.0000 mL | RESPIRATORY_TRACT | Status: DC | PRN

## 2024-04-14 MED ORDER — ONDANSETRON HCL 4 MG/2ML IJ SOLN: 4.0000 mg | Freq: Four times a day (QID) | INTRAMUSCULAR | Status: DC | PRN

## 2024-04-14 MED ORDER — HEPARIN SODIUM (PORCINE) 5000 UNIT/ML IJ SOLN
5000.0000 [IU] | Freq: Three times a day (TID) | INTRAMUSCULAR | Status: DC
  Administered 2024-04-14 – 2024-04-16 (×4): 5000 [IU] via SUBCUTANEOUS
  Filled 2024-04-14 (×4): qty 1

## 2024-04-14 MED ORDER — LEVOTHYROXINE SODIUM 100 MCG PO TABS
50.0000 ug | ORAL_TABLET | Freq: Every day | ORAL | Status: DC
  Administered 2024-04-15 – 2024-04-16 (×2): 50 ug via ORAL
  Filled 2024-04-14 (×2): qty 1

## 2024-04-14 MED ORDER — ACETAMINOPHEN 325 MG PO TABS: 650.0000 mg | ORAL_TABLET | Freq: Four times a day (QID) | ORAL | Status: DC | PRN

## 2024-04-14 MED ORDER — FUROSEMIDE 10 MG/ML IJ SOLN
20.0000 mg | Freq: Once | INTRAMUSCULAR | Status: AC
  Administered 2024-04-14: 20 mg via INTRAVENOUS
  Filled 2024-04-14: qty 2

## 2024-04-14 MED ORDER — FUROSEMIDE 10 MG/ML IJ SOLN: 40.0000 mg | Freq: Every day | INTRAMUSCULAR | Status: DC

## 2024-04-14 MED ORDER — HYDROCODONE-ACETAMINOPHEN 10-325 MG PO TABS
1.0000 | ORAL_TABLET | Freq: Four times a day (QID) | ORAL | Status: DC | PRN
  Administered 2024-04-15 (×2): 1 via ORAL
  Filled 2024-04-14 (×2): qty 1

## 2024-04-14 MED ORDER — MORPHINE SULFATE (PF) 2 MG/ML IV SOLN
2.0000 mg | INTRAVENOUS | Status: DC | PRN
  Administered 2024-04-14: 2 mg via INTRAVENOUS
  Filled 2024-04-14 (×2): qty 1

## 2024-04-14 MED ORDER — CHLORHEXIDINE GLUCONATE CLOTH 2 % EX PADS
6.0000 | MEDICATED_PAD | Freq: Every day | CUTANEOUS | Status: DC
  Administered 2024-04-15: 6 via TOPICAL

## 2024-04-14 MED ORDER — PANTOPRAZOLE SODIUM 40 MG PO TBEC
40.0000 mg | DELAYED_RELEASE_TABLET | Freq: Every day | ORAL | Status: DC
  Administered 2024-04-14 – 2024-04-15 (×2): 40 mg via ORAL
  Filled 2024-04-14 (×2): qty 1

## 2024-04-14 NOTE — Progress Notes (Signed)
 Pt arrived to floor from ED on her baseline 4LNC. Pt O2 sat noted to be 68% with no exertional activity, only conversation. Attempted to titrate pt O2 to 6L on regular flow [place] with no recovery. Pt switched to HFNC requiring 10L in order to maintain 91%. Dr. Dorinda notified and orders placed.

## 2024-04-14 NOTE — ED Notes (Signed)
Pure Wick applied 

## 2024-04-14 NOTE — ED Provider Notes (Signed)
 " [place] EMERGENCY DEPARTMENT AT [facility] Provider Note   CSN: [id number] Arrival date & time: 04/14/24  1147     Patient presents with: Leg Swelling   Sara Cochran is a 89 y.o. female.   HPI     Sara Cochran is a 89 y.o. female past medical history of hypertension, anemia, rectal cancer and COPD, chronically on 4 L continuous oxygen .  She presents to the Emergency Department from home accompanied by her son who provides most of history due to very hard of hearing.  He states his mother has had gradually worsening dyspnea on exertion with bilateral lower extremity edema.  Symptoms also associated with generalized weakness.  He noticed swelling of her bilateral legs over the weekend.  He states that normally she can ambulate with some assistance, but recently cannot ambulate due to shortness of breath even with attempting to get out of bed.  He states her appetite has been diminished but this is baseline for her.  No vomiting or diarrhea.  No reported fever or chills.  He endorses fall 1 week ago with some bruising of her right leg, but states that she has been ambulating since the fall and her current symptoms only worsened over the weekend   Prior to Admission medications  Medication Sig Start Date End Date Taking? Authorizing Provider  albuterol  (PROVENTIL  HFA;VENTOLIN  HFA) 108 (90 BASE) MCG/ACT inhaler Inhale 2 puffs into the lungs every 6 (six) hours as needed for wheezing or shortness of breath.    [provider]  ALPRAZolam  (XANAX ) 1 MG tablet Take 0.5 mg by mouth at bedtime as needed for sleep.    [provider]  amLODipine  (NORVASC ) 10 MG tablet Take 10 mg by mouth daily. 07/10/23   [provider]  diphenoxylate -atropine  (LOMOTIL ) 2.5-0.025 MG per tablet Take 2 tablets by mouth 4 (four) times daily as needed for diarrhea or loose stools. 04/07/13   Jason Charleston, MD  HYDROcodone -acetaminophen  (NORCO) 10-325 MG tablet Take 1 tablet by  mouth every 6 (six) hours as needed.    [provider]  ipratropium (ATROVENT ) 0.02 % nebulizer solution Take 0.5 mg by nebulization every 4 (four) hours as needed for wheezing or shortness of breath.    [provider]  ipratropium-albuterol  (DUONEB) 0.5-2.5 (3) MG/3ML SOLN Take 3 mLs by nebulization every 6 (six) hours as needed (shortness of breath).    [provider]  levothyroxine  (SYNTHROID , LEVOTHROID) 50 MCG tablet Take 50 mcg by mouth daily. 06/15/16   [provider]  omeprazole (PRILOSEC) 20 MG capsule Take 20 mg by mouth daily.    [provider]  OXYGEN  Inhale 4 L into the lungs continuous.    [provider]  Peppermint Oil (IBGARD PO) Take by mouth.    [provider]  pravastatin  (PRAVACHOL ) 40 MG tablet Take 40 mg by mouth daily.    [provider]    Allergies: Aspirin, Erythromycin, and Niacin and related    Review of Systems  Unable to perform ROS: Other  Respiratory:  Positive for shortness of breath.   Cardiovascular:  Positive for leg swelling.  All other systems reviewed and are negative.   Updated Vital Signs BP 121/64   Pulse 89   Temp 98.1 F (36.7 C) (Oral)   Resp 18   Ht 4' 10 (1.473 m)   Wt 29.9 kg   SpO2 99%   BMI 13.78 kg/m   Physical Exam Vitals and nursing note  reviewed.  Constitutional:      Appearance: Normal appearance.     Comments: Frail cachectic appearing  HENT:     Mouth/Throat:     Mouth: Mucous membranes are moist.     Pharynx: Oropharynx is clear.  Cardiovascular:     Rate and Rhythm: Normal rate and regular rhythm.     Pulses: Normal pulses.  Pulmonary:     Effort: Pulmonary effort is normal.     Comments: Wearing 4 L O2 by nasal cannula.  No increased work of breathing on exam.  Slightly wet lung sounds Abdominal:     General: There is no distension.     Palpations: Abdomen is soft.     Tenderness: There is no abdominal tenderness.   Musculoskeletal:     Right lower leg: Edema present.     Left lower leg: Edema present.     Comments: Pitting edema to the dorsal feet extending to the knees bilaterally.  Left is greater than right.  Extremities are warm and pink  Skin:    General: Skin is warm.     Capillary Refill: Capillary refill takes less than 2 seconds.  Neurological:     General: No focal deficit present.     Mental Status: She is alert.     Sensory: No sensory deficit.     Motor: No weakness.     (all labs ordered are listed, but only abnormal results are displayed) Labs Reviewed  CBC WITH DIFFERENTIAL/PLATELET - Abnormal; Notable for the following components:      Result Value   RBC 3.26 (*)    Hemoglobin 9.0 (*)    HCT 31.0 (*)    MCHC 29.0 (*)    All other components within normal limits  BASIC METABOLIC PANEL WITH GFR - Abnormal; Notable for the following components:   Chloride 96 (*)    CO2 37 (*)    BUN 28 (*)    Creatinine, Ser 1.41 (*)    Calcium  8.8 (*)    GFR, Estimated 36 (*)    All other components within normal limits  PRO BRAIN NATRIURETIC PEPTIDE - Abnormal; Notable for the following components:   Pro Brain Natriuretic Peptide 1,339.0 (*)    All other components within normal limits  TROPONIN T, HIGH SENSITIVITY - Abnormal; Notable for the following components:   Troponin T High Sensitivity 80 (*)    All other components within normal limits  TROPONIN T, HIGH SENSITIVITY - Abnormal; Notable for the following components:   Troponin T High Sensitivity 75 (*)    All other components within normal limits    EKG: EKG Interpretation Date/Time:  Tuesday April 14 2024 17:25:06 EST Ventricular Rate:  93 PR Interval:  134 QRS Duration:  104 QT Interval:  365 QTC Calculation: 454 R Axis:   155  Text Interpretation: Sinus rhythm Borderline low voltage, extremity leads RVH with secondary repolarization abnrm Lateral infarct, old No old tracing to compare Confirmed by Towana Sharper [phone]) on 04/14/2024 5:34:57 PM  Radiology: CT Chest Wo Contrast Result Date: 04/14/2024 EXAM: CT CHEST WITHOUT CONTRAST 04/14/2024 06:41:47 PM TECHNIQUE: CT of the chest was performed without the administration of intravenous contrast. Multiplanar reformatted images are provided for review. Automated exposure control, iterative reconstruction, and/or weight based adjustment of the mA/kV was utilized to reduce the radiation dose to as low as reasonably achievable. COMPARISON: Chest radiograph 04/14/2024, CT chest 07/24/2023, and PET CT 08/08/2023. CLINICAL HISTORY: Abnormal x-ray with lung opacity and lung  mass. Swelling to the feet and left lower leg. FINDINGS: MEDIASTINUM: Evaluation of mediastinal structures is limited due to lack of IV contrast material and patient cachexia. The heart size is normal. No pericardial effusions. Normal caliber thoracic aorta with diffuse calcifications. The esophagus is dilated with air-fluid level. This could represent distal esophageal mass, stricture, achalasia, or dysmotility. Esophageal dilatation is increased since the prior study. Probable hiatal hernia. The central airways are clear. LYMPH NODES: No definite lymphadenopathy. LUNGS AND PLEURA: Large left anterior lingular mass lesion measuring 5.8 x 6.5 cm. The mass demonstrates marked enlargement since the prior comparison studies. This is likely primary carcinoma. Severe diffuse emphysematous changes throughout the lungs. Linear scarring in the lung bases and right midlung scarring in the apices bilaterally. Small left pleural effusion. No pneumothorax. SOFT TISSUES/BONES: Multiple vertebral compression deformities in the mid and lower thoracic spine. Similar appearance to the previous study. This is likely osteoporosis. No focal bone lesions are identified to suggest metastatic disease. No acute abnormality of the soft tissues. UPPER ABDOMEN: Limited images of the upper abdomen demonstrates no acute  abnormality. IMPRESSION: 1. Large left anterior lingular mass lesion measuring 5.8 x 6.5 cm, likely primary carcinoma, with marked enlargement since prior studies. 2. Small left pleural effusion. 3. Dilated esophagus with air-fluid level, increased since prior study, possibly representing distal esophageal mass, stricture, achalasia, or dysmotility. Electronically signed by: Elsie Gravely MD 04/14/2024 07:17 PM EST RP Workstation: [id number]   DG Chest Portable 1 View Result Date: 04/14/2024 CLINICAL DATA:  Shortness of breath EXAM: PORTABLE CHEST 1 VIEW COMPARISON:  07/24/2023, PET CT 08/08/2023 FINDINGS: Hyperinflation with emphysema. Vague oval opacity measuring about 7.9 cm in the left mid to lower lung. Hiatal hernia. Aortic atherosclerosis. IMPRESSION: Hyperinflation with emphysema. Vague oval opacity in the left mid to lower lung, suspect that this represents interval enlargement of previously demonstrated lung mass. Recommend chest CT follow-up for further assessment Electronically Signed   By: Luke Bun M.D.   On: 04/14/2024 17:48   US  Venous Img Lower Bilateral Result Date: 04/14/2024 CLINICAL DATA:  Short of breath lower extremity swelling EXAM: Bilateral LOWER EXTREMITY VENOUS DOPPLER ULTRASOUND TECHNIQUE: Gray-scale sonography with compression, as well as color and duplex ultrasound, were performed to evaluate the deep venous system(s) from the level of the common femoral vein through the popliteal and proximal calf veins. COMPARISON:  None Available. FINDINGS: VENOUS Normal compressibility of the common femoral, superficial femoral, and popliteal veins, as well as the visualized calf veins. Visualized portions of profunda femoral vein and great saphenous vein unremarkable. No filling defects to suggest DVT on grayscale or color Doppler imaging. Doppler waveforms show normal direction of venous flow, normal respiratory plasticity and response to augmentation. OTHER None. Limitations: none  IMPRESSION: Negative. Electronically Signed   By: Luke Bun M.D.   On: 04/14/2024 17:45     Procedures    MDM  Patient here from home accompanied by her son who provides most of history.  Patient has documented history of rectal cancer, COPD she is on 4 L chronically of O2 at baseline.  Son reports generalized weakness and dyspnea on exertion.  Noticed bilateral peripheral edema over the weekend.  No history of CHF.  On my exam, patient is thin and frail appearing.  Does not appear in respiratory distress.  O2 sats in the 90s to 100s on 4 L here.  Lung sounds slightly wet on my exam she does have pitting edema bilateral lower extremities with left greater than  right.  No indication of ischemia.  Concern for developing CHF.  No reported cough or fever, pneumonia also considered but felt less likely.  Her chest x-ray does show emphysema and a opacity of the left mid to lower lung that may represent previous lung mass recommending CT  Her past medical records were reviewed, no documented prior echo..  She does not take diuretics, will start on low-dose furosemide  with plan to admit for diuresis and further workup of lung mass.  Medications Ordered in the ED - No data to display                                        Final diagnoses:  Congestive heart failure, unspecified HF chronicity, unspecified heart failure type [facility])    ED Discharge Orders     None          Herlinda Milling, PA-C 04/14/24 2351  "

## 2024-04-14 NOTE — H&P (Addendum)
 " History and Physical    Patient: Sara Cochran FMW:[id number] DOB: 1933/12/29 DOA: 04/14/2024 DOS: the patient was seen and examined on 04/14/2024 PCP: Shona Norleen PEDLAR, MD  Patient coming from: Home  Chief Complaint: Lower extremity swelling with associated worsening shortness of breath Chief Complaint  Patient presents with   Leg Swelling   HPI: Sara Cochran is a 89 y.o. female with medical history significant of COPD with chronic hypoxic respiratory failure on 4 L of oxygen  at home, GERD, history of colon cancer, lung cancer, hypertension, hyperlipidemia who lives at home with his son brought seen today on account of worsening lower extremity swelling bilaterally with associated exertional dyspnea and orthopnea.  Patient is very hard of hearing and unable to contribute to history and so history was obtained from previous chart review as well as discussion with patient's son present at bedside.  According to the patient's son her mom was diagnosed of having a lung cancer last year and was seen by oncologist however they did not want to proceed with any therapy given her age and her underlying medical conditions.  No complaints of abdominal pain, cough, nausea or vomiting.  ED course: Upon arrival to the emergency room patient had temperature 97.8, respiratory rate of 20, pulse 92, blood pressure 101/47 saturating 95% on 3 L of oxygen  Patient had significant elevation of BNP 1339, also had worsening of renal function mild troponin elevation and anemia.  No previous echocardiogram on file.  Given findings suspicious for CHF exacerbation [facility] was contacted to admit patient for further management.  Review of Systems: As mentioned in the history of present illness. All other systems reviewed and are negative. 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   COPD (chronic obstructive pulmonary  disease) (HCC)    High cholesterol    Hx of radiation therapy 02/23/13-04/06/13    rectal 50.4Gy/7fx   Hypertension    Migraine    Perirectal abscess    Weight loss    1 year 25 lbs   Past Surgical History:  Procedure Laterality Date   ABDOMINAL HYSTERECTOMY     CESAREAN SECTION     COLONOSCOPY W/ POLYPECTOMY  01/23/2001   polypoid colonic mucosa,no adenomatous change or malignancy identified   CYST REMOVAL HAND     3 removed from right hand   RECTAL BIOPSY  01/21/2013   invasive squamous cell ca,mod to poorly differentiatiated   Social History:  reports that she quit smoking about 27 years ago. She has never used smokeless tobacco. She reports that she does not drink alcohol and does not use drugs.  Allergies[1]  Family History  Problem Relation Age of Onset   Heart disease Mother    Stroke Mother    Cancer Sister        brain    Prior to Admission medications  Medication Sig Start Date End Date Taking? Authorizing Provider  albuterol  (PROVENTIL  HFA;VENTOLIN  HFA) 108 (90 BASE) MCG/ACT inhaler Inhale 2 puffs into the lungs every 6 (six) hours as needed for wheezing or shortness of breath.    [provider]  ALPRAZolam  (XANAX ) 1 MG tablet Take 0.5 mg by mouth at bedtime as needed for sleep.    [provider]  amLODipine  (NORVASC ) 10 MG tablet Take 10 mg by mouth daily. 07/10/23   [provider]  diphenoxylate -atropine  (LOMOTIL ) 2.5-0.025 MG per tablet Take 2 tablets by mouth 4 (four) times daily as needed for diarrhea or loose stools. 04/07/13   Jason Charleston, MD  HYDROcodone -acetaminophen  (NORCO) 10-325 MG tablet Take 1 tablet by mouth every 6 (six) hours as needed.    [provider]  ipratropium (ATROVENT ) 0.02 % nebulizer solution Take 0.5 mg by nebulization every 4 (four) hours as needed for wheezing or shortness of breath.    [provider]  ipratropium-albuterol  (DUONEB) 0.5-2.5 (3) MG/3ML SOLN Take 3 mLs by nebulization  every 6 (six) hours as needed (shortness of breath).    [provider]  levothyroxine  (SYNTHROID , LEVOTHROID) 50 MCG tablet Take 50 mcg by mouth daily. 06/15/16   [provider]  omeprazole (PRILOSEC) 20 MG capsule Take 20 mg by mouth daily.    [provider]  OXYGEN  Inhale 4 L into the lungs continuous.    [provider]  Peppermint Oil (IBGARD PO) Take by mouth.    [provider]  pravastatin  (PRAVACHOL ) 40 MG tablet Take 40 mg by mouth daily.    [provider]    Physical Exam: Vitals:   04/14/24 1915 04/14/24 1930 04/14/24 1945 04/14/24 2000  BP: (!) 121/59 (!) 120/59 (!) 126/57 120/61  Pulse: 84 86 88 92  Resp: (!) 23 17 18  (!) 22  Temp:      TempSrc:      SpO2: 100% 100% 100% 96%  Weight:      Height:       General: Elderly female laying in bed cachectic Respiratory distress requiring 6L of intranasal oxygen  Respiratory: Decreased air entry bilaterally at the bases with crackles CVS: S1, S2 present no murmurs heard Abdomen: Scaphoid with prominence of pelvic bones, nontender CNS: Awake and alert moving all extremities, very hard at hearing Psychiatry: Normal mood Musculoskeletal: Bilateral lower extremity pitting edema  Data Reviewed: CT scan of the chest reviewed showing findings of a large left anterior lingular mass lesion measuring 5.8 x 6.5 cm concerning for primary lung malignancy, small left-sided pleural effusion, dilated esophagus with air-fluid levels representing possible distal esophageal obstruction or dysmotility .    Latest Ref Rng & Units 04/14/2024   12:52 PM 07/31/2023   10:48 AM 07/24/2023   12:10 PM  CBC  WBC 4.0 - 10.5 K/uL 7.4  5.7  5.3   Hemoglobin 12.0 - 15.0 g/dL 9.0  89.1  89.9   Hematocrit 36.0 - 46.0 % 31.0  34.0  32.8   Platelets 150 - 400 K/uL 267  270  225        Latest Ref Rng & Units 04/14/2024   12:52 PM 07/31/2023   10:48 AM 07/24/2023   12:10 PM  BMP  Glucose 70 - 99 mg/dL 86   889  883   BUN 8 - 23 mg/dL 28  16  23    Creatinine 0.44 - 1.00 mg/dL 8.58  9.15  9.00   Sodium 135 - 145 mmol/L 145  139  136   Potassium 3.5 - 5.1 mmol/L 4.5  4.5  4.3   Chloride 98 - 111 mmol/L 96  95  92   CO2 22 - 32 mmol/L 37  35  33   Calcium  8.9 - 10.3 mg/dL 8.8  9.3  9.3     Assessment and Plan:  Possible new onset CHF exacerbation Acute on chronic hypoxic respiratory failure Patient presents with bilateral lower extremity edema as well as BNP of 1339, no previous  echo on file Continue IV Lasix  Monitor input and output as well as daily weight Placed on heart healthy diet Obtain echocardiogram After admission patient's oxygen  requirement increased Continue to monitor closely  Acute kidney injury likely secondary to cardiorenal Patient's last creatinine 8 months ago was 0.8 however presents with creatinine of 1.4 Continue management as above Monitor renal function closely  COPD with chronic hypoxic respiratory failure on 4 L of oxygen  at home Not in acute COPD exacerbation  Continue current oxygen  requirement Continue as needed nebulization  Normocytic anemia likely secondary to anemia of chronic disease in the setting of underlying malignancy Hemoglobin 9.0 No indication for transfusion at this time Continue to monitor CBC Denies any GI bleed  GERD Continue PPI therapy  Primary lung cancer CT scan of the chest showed findings of large left anterior lingula mass lesion measuring 5.8 x 6.5 cm concerning for primary lung cancer According to patient's and they followed up with oncologist who did not recommend treatment for her malignancy given patient's overall functional status. Patient was recommended for 5 rounds of radiation therapy but she refused to undergo any radiation therapy or further treatment.  Hypothyroidism Continue levothyroxine   Essential hypertension Patient is on Lasix  I will avoid any other antihypertensives given borderline  hypotension  Hyperlipidemia Continue statin therapy  Esophageal dysmotility versus possible vocal esophageal obstruction According to patient's son she is able to eat with no complaints of nausea or vomiting no dysphagia. CT scan of the chest reviewed with findings of dilated esophagus with air-fluid levels representing possible distal esophageal obstruction or dysmotility Since patient is not symptomatic of esophageal symptoms we could consider an outpatient follow-up. According to patient's son patient is not open to any invasive therapy.  Ambulatory dysfunction PT OT consulted  DVT prophylaxis-continue heparin    Advance Care Planning:   Code Status: Prior DNI DNR, this was confirmed by patient's son  Consults: None  Family Communication: Discussed with patient's son present at bedside  Severity of Illness: The appropriate patient status for this patient is INPATIENT. Inpatient status is judged to be reasonable and necessary in order to provide the required intensity of service to ensure the patient's safety. The patient's presenting symptoms, physical exam findings, and initial radiographic and laboratory data in the context of their chronic comorbidities is felt to place them at high risk for further clinical deterioration. Furthermore, it is not anticipated that the patient will be medically stable for discharge from the hospital within 2 midnights of admission.   * I certify that at the point of admission it is my clinical judgment that the patient will require inpatient hospital care spanning beyond 2 midnights from the point of admission due to high intensity of service, high risk for further deterioration and high frequency of surveillance required.*  Author: Drue ONEIDA Potter, MD 04/14/2024 8:06 PM  For on call review [email or web address].      [1]  Allergies Allergen Reactions   Aspirin Shortness Of Breath and Swelling    Other Reaction(s): Unknown   Erythromycin Hives and Rash     Other Reaction(s): Unknown   Niacin And Related Other (See Comments)    Redness all over    "

## 2024-04-14 NOTE — ED Notes (Signed)
 Patient transported to Rm 203.

## 2024-04-14 NOTE — ED Triage Notes (Signed)
 Pt with swelling to bilateral feet and left lower leg. Started the other day.

## 2024-04-14 NOTE — ED Notes (Signed)
 Patient report called to receiving RN.

## 2024-04-15 ENCOUNTER — Inpatient Hospital Stay (HOSPITAL_COMMUNITY)

## 2024-04-15 DIAGNOSIS — I5021 Acute systolic (congestive) heart failure: Secondary | ICD-10-CM

## 2024-04-15 DIAGNOSIS — I509 Heart failure, unspecified: Secondary | ICD-10-CM

## 2024-04-15 LAB — CBC
HCT: 30.3 % — ABNORMAL LOW (ref 36.0–46.0)
Hemoglobin: 8.9 g/dL — ABNORMAL LOW (ref 12.0–15.0)
MCH: 27.9 pg (ref 26.0–34.0)
MCHC: 29.4 g/dL — ABNORMAL LOW (ref 30.0–36.0)
MCV: 95 fL (ref 80.0–100.0)
Platelets: 257 10*3/uL (ref 150–400)
RBC: 3.19 MIL/uL — ABNORMAL LOW (ref 3.87–5.11)
RDW: 13.6 % (ref 11.5–15.5)
WBC: 11.7 10*3/uL — ABNORMAL HIGH (ref 4.0–10.5)
nRBC: 0 % (ref 0.0–0.2)

## 2024-04-15 LAB — ECHOCARDIOGRAM COMPLETE
AR max vel: 3.49 cm2
AV Area VTI: 3.9 cm2
AV Area mean vel: 3.67 cm2
AV Mean grad: 2 mmHg
AV Peak grad: 3.7 mmHg
Ao pk vel: 0.97 m/s
Area-P 1/2: 4.06 cm2
Height: 58 in
S' Lateral: 1.5 cm
Weight: 1100.54 [oz_av]

## 2024-04-15 LAB — HEPATIC FUNCTION PANEL
ALT: 9 U/L (ref 0–44)
AST: 28 U/L (ref 15–41)
Albumin: 3.8 g/dL (ref 3.5–5.0)
Alkaline Phosphatase: 102 U/L (ref 38–126)
Bilirubin, Direct: 0.3 mg/dL — ABNORMAL HIGH (ref 0.0–0.2)
Indirect Bilirubin: 0.2 mg/dL — ABNORMAL LOW (ref 0.3–0.9)
Total Bilirubin: 0.5 mg/dL (ref 0.0–1.2)
Total Protein: 6 g/dL — ABNORMAL LOW (ref 6.5–8.1)

## 2024-04-15 LAB — BASIC METABOLIC PANEL WITH GFR
Anion gap: 13 (ref 5–15)
BUN: 32 mg/dL — ABNORMAL HIGH (ref 8–23)
CO2: 38 mmol/L — ABNORMAL HIGH (ref 22–32)
Calcium: 8.6 mg/dL — ABNORMAL LOW (ref 8.9–10.3)
Chloride: 97 mmol/L — ABNORMAL LOW (ref 98–111)
Creatinine, Ser: 1.66 mg/dL — ABNORMAL HIGH (ref 0.44–1.00)
GFR, Estimated: 29 mL/min — ABNORMAL LOW
Glucose, Bld: 84 mg/dL (ref 70–99)
Potassium: 4.5 mmol/L (ref 3.5–5.1)
Sodium: 148 mmol/L — ABNORMAL HIGH (ref 135–145)

## 2024-04-15 LAB — PROCALCITONIN: Procalcitonin: 0.32 ng/mL

## 2024-04-15 MED ORDER — ALPRAZOLAM 0.25 MG PO TABS: 0.5000 mg | ORAL_TABLET | Freq: Every evening | ORAL | Status: DC | PRN

## 2024-04-15 NOTE — TOC Initial Note (Signed)
 Transition of Care [facility]) - Initial/Assessment Note    Patient Details  Name: Sara Cochran MRN: [id number] Date of Birth: 04-06-1933  [facility] CM/SW Contact:    Hoy DELENA Bigness, LCSW Phone Number: 04/15/2024, 10:18 AM  Clinical Narrative:                 Pt from home with son and DIL. Pt is on 4L of oxygen  at baseline and is currently requiring 8L. Pt is being recommended for comfort care and plan will be for pt to return home with hospice services. Son and DIL request for services through [facility]. Referral made to [facility] with [facility] for home with hospice. [facility] to order oxygen  and have liter flow increased. [facility] to speak with son regarding possible need for hospital bed and other DME. ICM will continue to follow.   Expected Discharge Plan: Home w Hospice Care Barriers to Discharge: Continued Medical Work up   Patient Goals and CMS Choice Patient states their goals for this hospitalization and ongoing recovery are:: For pt to return home with hospice CMS Medicare.gov Compare Post Acute Care list provided to:: Patient Represenative (must comment) Choice offered to / list presented to : Adult Children [place] ownership interest in [facility].provided to::  (NA)    Expected Discharge Plan and Services In-house Referral: Clinical Social Work, Hospice / Palliative Care Discharge Planning Services: NA Post Acute Care Choice: Hospice Living arrangements for the past 2 months: Single Family Home                                      Prior Living Arrangements/Services Living arrangements for the past 2 months: Single Family Home Lives with:: Adult Children Patient language and need for interpreter reviewed:: Yes Do you feel safe going back to the place where you live?: Yes      Need for Family Participation in Patient Care: Yes (Comment) Care giver support system in place?: Yes (comment) Current home services: DME (4L O2) Criminal  Activity/Legal Involvement Pertinent to Current Situation/Hospitalization: No - Comment as needed  Activities of Daily Living   ADL Screening (condition at time of admission) Independently performs ADLs?: No Does the patient have a NEW difficulty with bathing/dressing/toileting/self-feeding that is expected to last >3 days?: No Does the patient have a NEW difficulty with getting in/out of bed, walking, or climbing stairs that is expected to last >3 days?: Yes (Initiates electronic notice to provider for possible PT consult) Does the patient have a NEW difficulty with communication that is expected to last >3 days?: No Is the patient deaf or have difficulty hearing?: Yes Does the patient have difficulty seeing, even when wearing glasses/contacts?: No Does the patient have difficulty concentrating, remembering, or making decisions?: Yes  Permission Sought/Granted Permission sought to share information with : Facility Medical Sales Representative, Family Supports Permission granted to share information with : Yes, Verbal Permission Granted     Permission granted to share info w AGENCY: [facility]  Permission granted to share info w Relationship: Son and DIL     Emotional Assessment Appearance:: Appears older than stated age Attitude/Demeanor/Rapport: Unable to Assess Affect (typically observed): Unable to Assess Orientation: : Oriented to Self Alcohol / Substance Use: Not Applicable Psych Involvement: No (comment)  Admission diagnosis:  CHF exacerbation (HCC) [I50.9] Patient Active Problem List   Diagnosis Date Noted   CHF exacerbation (HCC) 04/14/2024  Nodule of upper lobe of left lung 07/31/2023   Hypomagnesemia 04/30/2013   Hypokalemia 04/30/2013   Diarrhea 04/30/2013   Hypocalcemia 04/30/2013   Physical deconditioning 04/30/2013   Colitis 04/30/2013   UTI (urinary tract infection) 04/30/2013   DVT (deep venous thrombosis) (HCC) 04/27/2013   Dehydration 04/23/2013   Sepsis (HCC)  04/23/2013   Neutropenia 04/23/2013   Anemia 04/23/2013   Thrombocytopenia, unspecified 04/23/2013   HTN (hypertension) 04/23/2013   Anxiety state 04/23/2013   Severe protein-calorie malnutrition 04/23/2013   Anal cancer (HCC) 01/26/2013   PCP:  Shona Norleen PEDLAR, MD Pharmacy:   [facility] [phone] - [place], [place] - [address] AT SEC OF [address] [place] [address] Phone: [phone] Fax: [phone]     Social Drivers of Health (SDOH) Social History: SDOH Screenings   Food Insecurity: No Food Insecurity (04/14/2024)  Housing: Low Risk (04/14/2024)  Transportation Needs: No Transportation Needs (04/15/2024)  Utilities: Not At Risk (04/14/2024)  Social Connections: Socially Isolated (04/14/2024)  Tobacco Use: Medium Risk (04/14/2024)   SDOH Interventions:     Readmission Risk Interventions     No data to display

## 2024-04-15 NOTE — Progress Notes (Signed)
 SPIRITUAL CARE AND COUNSELING CONSULT NOTE   VISIT SUMMARY    Reason for Visit: Chaplain responding to inbox message from RN  suggesting that Pt and family could use support.  Description of Visit: Upon entering the room I found the Pt in recliner with her son Gene there for support.   Pt had difficulty understanding and communicating with me. I engaged the son and help him process the heavy decisions he has needed to make.  He is understandably emotional.  He appears to have the appropriate coping skills and community support behind him for the journey.  I encouraged him to connect with the chaplain that hospice services will provide.   Plan of Care: No further spiritual care interventions are planned at this time as the Pt is expected to discharge to home hospice tonight or tomorrow.  SPIRITUAL ENCOUNTER                                                                                                                                                                      Type of Visit: Initial Care provided to:: Patient, Family (Son Gene) Referral source: Nurse (RN/NT/LPN) Reason for visit: Urgent spiritual support OnCall Visit: No   SPIRITUAL FRAMEWORK  Presenting Themes: Goals in life/care, Values and beliefs, Impactful experiences and emotions Values/beliefs: Not reviewed at this time Strengths: Not reviewed at this time Needs/Challenges/Barriers: Not reviewed at this time Patient Stress Factors: Not reviewed Family Stress Factors: Exhausted, Major life changes, Health changes   GOALS   Self/Personal Goals: Not reviewed at this time Clinical Care Goals: Provide space for compassionate comfort   INTERVENTIONS   Spiritual Care Interventions Made: Established relationship of care and support, Compassionate presence, Narrative/life review, Encouragement    INTERVENTION OUTCOMES   Outcomes: Connection to spiritual care, Awareness of support  SPIRITUAL CARE PLAN   Spiritual  Care Issues Still Outstanding: No further spiritual care needs at this time (see row info)   Maude Roll, MDiv Chaplain, [facility] [email or web address] [phone] 04/15/2024 11:49 AM

## 2024-04-15 NOTE — Evaluation (Signed)
 Occupational Therapy Evaluation Patient Details Name: Sara Cochran MRN: [id number] DOB: 1933/12/26 Today's Date: 04/15/2024   History of Present Illness   Sara Cochran is a 89 y.o. female with medical history significant of COPD with chronic hypoxic respiratory failure on 4 L of oxygen  at home, GERD, history of colon cancer, lung cancer, hypertension, hyperlipidemia who lives at home with his son brought seen today on account of worsening lower extremity swelling bilaterally with associated exertional dyspnea and orthopnea.  Patient is very hard of hearing and unable to contribute to history and so history was obtained from previous chart review as well as discussion with patient's son present at bedside.  According to the patient's son her mom was diagnosed of having a lung cancer last year and was seen by oncologist however they did not want to proceed with any therapy given her age and her underlying medical conditions.  No complaints of abdominal pain, cough, nausea or vomiting. (per MD)     Clinical Impressions Pt very hard of hearing with minimal verbal communication. Pt able to follow directions with much cuing for PT and OT evaluation. Pt required CGA for bed mobility and min to mod A for bed to chair transfer with RW. Pt demonstrates need for assist to complete lower body dressing. B UE generally weak. After evaluation it was discovered the pt's family is deciding to go with hospice care. Pt left in the chair with call bell within reach and chair alarm set.  Pt is not recommended for any further acute OT services and will be discharged to care of nursing staff for remaining length of stay.       If plan is discharge home, recommend the following:   A little help with walking and/or transfers;A lot of help with bathing/dressing/bathroom;Assistance with cooking/housework;Assist for transportation;Help with stairs or ramp for entrance     Functional Status Assessment   Patient has  had a recent decline in their functional status and demonstrates the ability to make significant improvements in function in a reasonable and predictable amount of time.     Equipment Recommendations   None recommended by OT             Precautions/Restrictions   Precautions Precautions: Fall Recall of Precautions/Restrictions: Impaired Restrictions Weight Bearing Restrictions Per Provider Order: No     Mobility Bed Mobility Overal bed mobility: Needs Assistance Bed Mobility: Supine to Sit     Supine to sit: Contact guard     General bed mobility comments: labored effort    Transfers Overall transfer level: Needs assistance Equipment used: Rolling walker (2 wheels) Transfers: Sit to/from Stand, Bed to chair/wheelchair/BSC Sit to Stand: Contact guard assist, Min assist     Step pivot transfers: Min assist, Mod assist     General transfer comment: EOB to chair with RW.      Balance Overall balance assessment: Needs assistance Sitting-balance support: No upper extremity supported, Feet supported Sitting balance-Leahy Scale: Fair Sitting balance - Comments: fair to good seated at EOB.   Standing balance support: Bilateral upper extremity supported, During functional activity, Reliant on assistive device for balance Standing balance-Leahy Scale: Fair Standing balance comment: poor to fair using RW                           ADL either performed or assessed with clinical judgement   ADL Overall ADL's : Needs assistance/impaired     Grooming: Set up;Sitting  Lower Body Bathing: Moderate assistance;Maximal assistance;Sitting/lateral leans   Upper Body Dressing : Set up;Sitting   Lower Body Dressing: Moderate assistance;Maximal assistance;Sitting/lateral leans   Toilet Transfer: Minimal assistance;Moderate assistance;Stand-pivot;Rolling walker (2 wheels) Toilet Transfer Details (indicate cue type and reason): EOB to chair with  RW Toileting- Clothing Manipulation and Hygiene: Moderate assistance;Maximal assistance;Sit to/from stand               Vision Patient Visual Report:  (Unsure of baseline vision.)                         Pertinent Vitals/Pain Pain Assessment Pain Assessment: Faces Faces Pain Scale: No hurt     Extremity/Trunk Assessment Upper Extremity Assessment Upper Extremity Assessment: Generalized weakness   Lower Extremity Assessment Lower Extremity Assessment: Defer to PT evaluation   Cervical / Trunk Assessment Cervical / Trunk Assessment: Kyphotic   Communication Communication Communication: Impaired Factors Affecting Communication: Hearing impaired   Cognition Arousal: Alert Behavior During Therapy: WFL for tasks assessed/performed Cognition: No family/caregiver present to determine baseline             OT - Cognition Comments: Hard of hearing vs cognition.                 Following commands: Intact       Cueing  General Comments   Cueing Techniques: Gestural cues;Verbal cues;Tactile cues;Visual cues                 Home Living Family/patient expects to be discharged to:: Private residence Living Arrangements: Children                               Additional Comments: Pt unable to provide additional history. Pt reportedly very hard of hearing.      Prior Functioning/Environment Prior Level of Function : Patient poor historian/Family not available;Needs assist             Mobility Comments: Unsure of baseline function. ADLs Comments: Unsure of baseline function.                            Co-evaluation PT/OT/SLP Co-Evaluation/Treatment: Yes Reason for Co-Treatment: To address functional/ADL transfers   OT goals addressed during session: ADL's and self-care                       End of Session Equipment Utilized During Treatment: Rolling walker (2 wheels);Gait belt Nurse Communication:  Mobility status  Activity Tolerance: Patient tolerated treatment well Patient left: in chair;with call bell/phone within reach;with chair alarm set  OT Visit Diagnosis: Unsteadiness on feet (R26.81);Other abnormalities of gait and mobility (R26.89);Muscle weakness (generalized) (M62.81)                Time: 9072-9055 OT Time Calculation (min): 17 min Charges:  OT General Charges $OT Visit: 1 Visit OT Evaluation $OT Eval Low Complexity: 1 Low  Kaien Pezzullo OT, MOT  Jayson Person 04/15/2024, 11:02 AM

## 2024-04-15 NOTE — Evaluation (Signed)
 Physical Therapy Evaluation Patient Details Name: [name] MRN: [id number] DOB: [birth year] Today's Date: 04/15/2024  History of Present Illness  Sara Cochran is a 89 y.o. female with medical history significant of COPD with chronic hypoxic respiratory failure on 4 L of oxygen  at home, GERD, history of colon cancer, lung cancer, hypertension, hyperlipidemia who lives at home with his son brought seen today on account of worsening lower extremity swelling bilaterally with associated exertional dyspnea and orthopnea.  Patient is very hard of hearing and unable to contribute to history and so history was obtained from previous chart review as well as discussion with patient's son present at bedside.  According to the patient's son her mom was diagnosed of having a lung cancer last year and was seen by oncologist however they did not want to proceed with any therapy given her age and her underlying medical conditions.  No complaints of abdominal pain, cough, nausea or vomiting.   Clinical Impression  Patient appears agreeable to PT/OT co-evaluation. Patient is lethargic at beginning of session and unable to provide any history. Alertness improves with outside stimulation but pt continues to demonstrate altered cognition. Unclear of prior level of function as no family is present. This date, patient requires min assist for bed mobility and min/mod for transfers and side steps with RW. Pt is frail and generally weak and unsteady but tolerates sitting in chair at end of session with alarm set, and nursing staff in room. Following completion of evaluation, therapy is made aware of family's choice to return home with hospice. PT will sign off at this time. Patient discharged to care of nursing for ambulation daily as tolerated for length of stay.       If plan is discharge home, recommend the following: A lot of help with walking and/or transfers;A lot of help with bathing/dressing/bathroom;Assist for  transportation;Assistance with cooking/housework;Supervision due to cognitive status;Help with stairs or ramp for entrance   Can travel by private vehicle        Equipment Recommendations None recommended by PT  Recommendations for Other Services       Functional Status Assessment Patient has had a recent decline in their functional status and demonstrates the ability to make significant improvements in function in a reasonable and predictable amount of time.     Precautions / Restrictions Precautions Precautions: Fall Recall of Precautions/Restrictions: Impaired Restrictions Weight Bearing Restrictions Per Provider Order: No      Mobility  Bed Mobility Overal bed mobility: Needs Assistance Bed Mobility: Supine to Sit     Supine to sit: Contact guard, Min assist     General bed mobility comments: pt demo very labored effort requiring inc time    Transfers Overall transfer level: Needs assistance Equipment used: Rolling walker (2 wheels) Transfers: Sit to/from Stand, Bed to chair/wheelchair/BSC Sit to Stand: Contact guard assist, Min assist   Step pivot transfers: Min assist, Mod assist       General transfer comment: assist for STS from bed due to LE weakness, pt demo very slow labored movement due to general weakness    Ambulation/Gait Ambulation/Gait assistance: Min assist, Mod assist Gait Distance (Feet): 3 Feet Assistive device: Rolling walker (2 wheels) Gait Pattern/deviations: Step-to pattern, Decreased step length - right, Decreased step length - left, Trunk flexed, Decreased stride length Gait velocity: dec     General Gait Details: pt limited to a few side steps at bedside with RW and min/mod assist for steadying and due to LE  weakness, slow labored movement throughout  Stairs            Wheelchair Mobility     Tilt Bed    Modified Rankin (Stroke Patients Only)       Balance Overall balance assessment: Needs  assistance Sitting-balance support: No upper extremity supported, Feet supported Sitting balance-Leahy Scale: Fair Sitting balance - Comments: fair to good seated at EOB.   Standing balance support: Bilateral upper extremity supported, During functional activity, Reliant on assistive device for balance Standing balance-Leahy Scale: Fair Standing balance comment: poor to fair using RW             Pertinent Vitals/Pain Pain Assessment Pain Assessment: Faces Faces Pain Scale: No hurt    Home Living Family/patient expects to be discharged to:: Private residence Living Arrangements: Children                 Additional Comments: Pt unable to provide additional history. Pt reportedly very hard of hearing and confused during session    Prior Function Prior Level of Function : Patient poor historian/Family not available;Needs assist             Mobility Comments: Unsure of baseline function. No family present ADLs Comments: Unsure of baseline function. No family present     Extremity/Trunk Assessment   Upper Extremity Assessment Upper Extremity Assessment: Generalized weakness    Lower Extremity Assessment Lower Extremity Assessment: Generalized weakness    Cervical / Trunk Assessment Cervical / Trunk Assessment: Kyphotic  Communication   Communication Communication: Impaired Factors Affecting Communication: Hearing impaired    Cognition Arousal: Alert Behavior During Therapy: WFL for tasks assessed/performed       Following commands: Intact       Cueing Cueing Techniques: Gestural cues, Verbal cues, Tactile cues, Visual cues     General Comments      Exercises     Assessment/Plan    PT Assessment All further PT needs can be met in the next venue of care  PT Problem List Decreased strength;Decreased range of motion;Decreased activity tolerance;Decreased balance;Decreased mobility;Decreased cognition       PT Treatment Interventions      PT  Goals (Current goals can be found in the Care Plan section)  Acute Rehab PT Goals PT Goal Formulation: Patient unable to participate in goal setting    Frequency       Co-evaluation PT/OT/SLP Co-Evaluation/Treatment: Yes Reason for Co-Treatment: To address functional/ADL transfers PT goals addressed during session: Mobility/safety with mobility OT goals addressed during session: ADL's and self-care       AM-PAC PT 6 Clicks Mobility  Outcome Measure Help needed turning from your back to your side while in a flat bed without using bedrails?: A Lot Help needed moving from lying on your back to sitting on the side of a flat bed without using bedrails?: A Lot Help needed moving to and from a bed to a chair (including a wheelchair)?: A Lot Help needed standing up from a chair using your arms (e.g., wheelchair or bedside chair)?: A Lot Help needed to walk in hospital room?: A Lot Help needed climbing 3-5 steps with a railing? : A Lot 6 Click Score: 12    End of Session Equipment Utilized During Treatment: Gait belt Activity Tolerance: Patient tolerated treatment well;Patient limited by fatigue Patient left: in chair;with call bell/phone within reach;with chair alarm set Nurse Communication: Mobility status PT Visit Diagnosis: Unsteadiness on feet (R26.81);Adult, failure to thrive (R62.7);Muscle weakness (generalized) (M62.81);Other abnormalities  of gait and mobility (R26.89);Difficulty in walking, not elsewhere classified (R26.2)    Time: 9071-9055 PT Time Calculation (min) (ACUTE ONLY): 16 min   Charges:   PT Evaluation $PT Eval Low Complexity: 1 Low   PT General Charges $$ ACUTE PT VISIT: 1 Visit        1:43 PM, 04/15/24 Lendora Keys Powell-Butler, PT, DPT [place] with [facility]

## 2024-04-15 NOTE — Progress Notes (Signed)
 " PROGRESS NOTE  Sara Cochran  DOB: [birth year]  PCP: Shona Norleen PEDLAR, MD FMW:[id number]  DOA: 04/14/2024  LOS: 1 day  Hospital Day: 2  Subjective: Patient was seen and examined this morning. Very thin built cachectic elderly female on 8 L oxygen . Not in pain or distress. Son at bedside. In last 24 hours, afebrile, heart rate 90s and 100s, blood pressure in normal range, requiring 8 L oxygen  by nasal cannula. With 1 dose of IV Lasix , pedal edema seems to have significant improved per son.  Brief narrative: Sara Cochran is a 89 y.o. female with PMH significant for COPD on 4lpm O2 at home, h/o lung cancer, colon cancer, HTN, HLD, GERD, chronic back pain, very hard of hearing. Patient lives at home with his son.  At baseline, can ambulate with some assistance. 1/27, brought to the ED with complaint of several days of progressive dyspnea, grossly worsening swelling of both legs, generalized weakness, limiting her mobility.  Symptoms particular worsening in the last 2 days.  Patient was diagnosed with lung cancer last year, seen by oncologist and did not recommend any treatment given her advanced age and underlying chronic medical issues.  She was recommended radiation treatment but apparently refused that.  In the ED, patient was afebrile, hemodynamically stable, initially saturating on 3 to 4 L oxygen  but later required high flow oxygen  at 10 L. Initial labs with WC count normal, hemoglobin 9, proBNP elevated to over 1300, troponin elevated to 80, BUN/creatinine 28/1.41 Ultrasound duplex of lower extremities negative for DVT. Given IV Lasix . Admitted to [facility] CT scan of chest showed --Large left anterior lingular mass lesion measuring 5.8 x 6.5 cm, likely primary carcinoma, with marked enlargement since prior studies. Small left pleural effusion. -- Dilated esophagus with air-fluid level, increased since prior study, possibly representing distal esophageal mass, stricture, achalasia, or  dysmotility.  Assessment and plan: Possible new onset CHF exacerbation HTN Presented with progressive dyspnea, bilateral lower extremity, elevated BNP. Given 1 dose of Lasix  in the ED With 1 dose of IV Lasix , pedal edema seems to have significant improved per son. echocardiogram was done this morning.  Pending report. PTA meds- amlodipine  10 mg daily.  Currently on hold.  Continue to monitor for daily intake output, weight, blood pressure, BNP, renal function and electrolytes. Net IO Since Admission: -350 mL [04/15/24 1004] Recent Labs  Lab 04/14/24 1252 04/15/24 0417  PROBNP 1,339.0*  --   BUN 28* 32*  CREATININE 1.41* 1.66*  NA 145 148*  K 4.5 4.5   Acute kidney injury Hypernatremia Baseline creatinine 0.8 in the last year.  Presented with creatinine elevated 1.4 . Ordered 1 dose of IV Lasix  last night, creatinine has worsened to 1.66 this morning.  Sodium level rising as well.  I would hold off on further Lasix  at this time  Continue to monitor renal function   Acute on chronic chronic hypoxic respiratory failure  COPD On 4 L oxygen  at home.  Requiring up to 10 L high flow oxygen  due to CHF exacerbation Continue to monitor Bronchodilators as needed  Primary lung cancer According to patient's and they followed up with oncologist who did not recommend treatment for her malignancy given patient's overall functional status. Patient was recommended for 5 rounds of radiation therapy but she refused to undergo any radiation therapy or further treatment. This admission, CT scan of the chest showed findings of large left anterior lingula mass lesion measuring 5.8 x 6.5 cm concerning for primary lung  cancer.   Abnormal esophagus CT scan on admission noted dilated esophagus with air-fluid level, increased since prior study, possibly representing distal esophageal mass, stricture, achalasia, or dysmotility. But apparently patient has no issues of dysphagia, nausea or vomiting.  Son  states patient is not open to any invasive therapy.   Mild chronic anemia  GERD In the setting of malignancy, h/o GERD No active bleeding.  Hemoglobin at baseline close to 9 Continue PPI Continue to monitor Recent Labs    07/24/23 1210 07/31/23 1047 07/31/23 1048 04/14/24 1252 04/15/24 0417  HGB 10.0*  --  10.8* 9.0* 8.9*  MCV 93.2  --  92.9 95.1 95.0  VITAMINB12  --  192  --   --   --   FOLATE  --   --  15.2  --   --   FERRITIN  --  44  --   --   --   TIBC  --  374  --   --   --   IRON  --  48  --   --   --    Hypothyroidism Continue levothyroxine     Hyperlipidemia Continue statin therapy  Anxiety PTA meds- Xanax  0.5 mg nightly as needed,    Ambulatory dysfunction PT OT consulted   Nutrition Status:         Mobility: Very weak recently  Goals of care   Code Status: Limited: Do not attempt resuscitation (DNR) -DNR-LIMITED -Do Not Intubate/DNI   I spoke with the patient this morning.  Patient is a hospice candidate. Daughter in law is a engineer, civil (consulting).  Family has decided to do home with hospice services. Arrangement in process   DVT prophylaxis:  heparin  injection 5,000 Units Start: 04/14/24 2200   Antimicrobials: None Fluid: None Consultants: None Family Communication: Son at bedside  Status: Inpatient Level of care:  Telemetry   Patient is from: Home Anticipated d/c to: Making arrangements for discharge with home hospice services   Diet:  Diet Order             Diet Heart Room service appropriate? Yes; Fluid consistency: Thin  Diet effective now                   Scheduled Meds:  Chlorhexidine  Gluconate Cloth  6 each Topical Daily   heparin   5,000 Units Subcutaneous Q8H   levothyroxine   50 mcg Oral Q0600   pantoprazole   40 mg Oral Daily   pravastatin   40 mg Oral QHS    PRN meds: acetaminophen , ALPRAZolam , HYDROcodone -acetaminophen , ipratropium-albuterol , morphine  injection, ondansetron  (ZOFRAN ) IV   Infusions:     Antimicrobials: Anti-infectives (From admission, onward)    None       Objective: Vitals:   04/15/24 0243 04/15/24 0533  BP:  (!) 113/53  Pulse:  (!) 102  Resp:    Temp:  98.6 F (37 C)  SpO2: 100% 98%    Intake/Output Summary (Last 24 hours) at 04/15/2024 1004 Last data filed at 04/15/2024 0202 Gross per 24 hour  Intake --  Output 350 ml  Net -350 ml   Filed Weights   04/14/24 1231 04/14/24 2115 04/15/24 0533  Weight: 29.9 kg 31.2 kg 31.2 kg   Weight change:  Body mass index is 14.38 kg/m.   Physical Exam: General exam: Pleasant, elderly Caucasian female Skin: No rashes, lesions or ulcers. HEENT: Atraumatic, normocephalic, no obvious bleeding Lungs: Diminished air entry in both bases, otherwise clear to auscultation bilaterally, on 8 L oxygen  CVS: S1,  S2, no murmur,   GI/Abd: Soft, nontender, nondistended, bowel sound present,   CNS: Alert, awake, very hard of hearing Psychiatry: Sad affect Extremities: Mild edema on left ankle.  Otherwise no pedal edema.  No calf tenderness.  Data Review: I have personally reviewed the laboratory data and studies available.  F/u labs  Unresulted Labs (From admission, onward)    None       Signed, Chapman Rota, MD [facility] 04/15/2024  "

## 2024-04-15 NOTE — Plan of Care (Signed)

## 2024-04-16 MED ORDER — ACETAMINOPHEN 325 MG PO TABS: 650.0000 mg | ORAL_TABLET | Freq: Four times a day (QID) | ORAL | Status: AC | PRN

## 2024-04-16 MED ORDER — HYDROCODONE-ACETAMINOPHEN 10-325 MG PO TABS: 1.0000 | ORAL_TABLET | Freq: Four times a day (QID) | ORAL | 0 refills | Status: AC | PRN

## 2024-04-16 NOTE — Plan of Care (Signed)

## 2024-04-16 NOTE — Discharge Summary (Signed)
 "  Physician Discharge Summary  Sara Cochran FMW:[id number] DOB: 29-Sep-1933 DOA: 04/14/2024  PCP: Sara Norleen PEDLAR, MD  Admit date: 04/14/2024 Discharge date: 04/16/2024  Admitted from: Home Discharge disposition: Home with home hospice services  Subjective: Patient was seen and examined this morning. Sitting up in recliner.  Not in distress.  On 4 L oxygen .  Son at bedside. Son is ready to take her home today.  He will take her in his truck.  He has oxygen  tank in the truck.  Brief narrative: Sara Cochran is a 89 y.o. female with PMH significant for COPD on 4lpm O2 at home, h/o lung cancer, colon cancer, HTN, HLD, GERD, chronic back pain, very hard of hearing. Patient lives at home with his son.  At baseline, can ambulate with some assistance. 1/27, brought to the ED with complaint of several days of progressive dyspnea, grossly worsening swelling of both legs, generalized weakness, limiting her mobility.  Symptoms particular worsening in the last 2 days.  Patient was diagnosed with lung cancer last year, seen by oncologist and did not recommend any treatment given her advanced age and underlying chronic medical issues.  She was recommended radiation treatment but apparently refused that.  In the ED, patient was afebrile, hemodynamically stable, initially saturating on 3 to 4 L oxygen  but later required high flow oxygen  at 10 L. Initial labs with WC count normal, hemoglobin 9, proBNP elevated to over 1300, troponin elevated to 80, BUN/creatinine 28/1.41 Ultrasound duplex of lower extremities negative for DVT. Given IV Lasix .  CT scan of chest showed --Large left anterior lingular mass lesion measuring 5.8 x 6.5 cm, likely primary carcinoma, with marked enlargement since prior studies. Small left pleural effusion. -- Dilated esophagus with air-fluid level, increased since prior study, possibly representing distal esophageal mass, stricture, achalasia, or dysmotility.  Patient was admitted  to hospital service 1/28, on my evaluation, patient was cachectic on 8 L oxygen .  Son at bedside understood her frail medical condition.  Hospice was recommended which family accepted. Seen by hospice nurse for arrangement of hospice services for discharge home with hospice.  Hospital course: Comfort care status Primarily admitted for dyspnea, pedal edema. Overall clinical condition failed because of underlying untreated cancer Hospice admission was recommended with family has accepted. Nonessential meds have been stopped. Seen by hospice nurse for arrangement of hospice services for discharge home with hospice. Currently on 2 to 4 L oxygen .  It is okay to continue oxygen  supplementation if family wants. Okay to discharge home with home health services today  Other medical issues currently not being treated HTN Acute kidney injury Hypernatremia Acute on chronic chronic hypoxic respiratory failure  COPD Primary lung cancer Abnormal esophagus Mild chronic anemia  GERD Hypothyroidism Hyperlipidemia Anxiety Ambulatory dysfunction  Goals of care   Code Status: Do not attempt resuscitation (DNR) - Comfort care   Diet:  Diet Order             Diet general           Diet Heart Room service appropriate? Yes; Fluid consistency: Thin  Diet effective now                   Nutritional status:  Body mass index is 13.78 kg/m.       Wounds:  -    Discharge Medications:   Allergies as of 04/16/2024       Reactions   Aspirin Shortness Of Breath, Swelling   Other Reaction(s): Unknown  Erythromycin Hives, Rash   Other Reaction(s): Unknown   Niacin And Related Other (See Comments)   Redness all over        Medication List     STOP taking these medications    amLODipine  10 MG tablet Commonly known as: NORVASC    levothyroxine  50 MCG tablet Commonly known as: SYNTHROID    pravastatin  40 MG tablet Commonly known as: PRAVACHOL        TAKE these medications     acetaminophen  325 MG tablet Commonly known as: TYLENOL  Take 2 tablets (650 mg total) by mouth every 6 (six) hours as needed for mild pain (pain score 1-3), fever or headache.   albuterol  108 (90 Base) MCG/ACT inhaler Commonly known as: VENTOLIN  HFA Inhale 2 puffs into the lungs every 6 (six) hours as needed for wheezing or shortness of breath.   ALPRAZolam  1 MG tablet Commonly known as: XANAX  Take 0.5 mg by mouth at bedtime as needed for sleep.   diphenoxylate -atropine  2.5-0.025 MG tablet Commonly known as: LOMOTIL  Take 2 tablets by mouth 4 (four) times daily as needed for diarrhea or loose stools.   HYDROcodone -acetaminophen  10-325 MG tablet Commonly known as: NORCO Take 1 tablet by mouth every 6 (six) hours as needed.   ipratropium-albuterol  0.5-2.5 (3) MG/3ML Soln Commonly known as: DUONEB Take 3 mLs by nebulization every 6 (six) hours as needed (shortness of breath).   omeprazole 20 MG capsule Commonly known as: PRILOSEC Take 20 mg by mouth daily.   OXYGEN  Inhale 4 L into the lungs continuous.         Follow ups:    Follow-up Information     Sara Norleen PEDLAR, MD Follow up.   Specialty: Internal Medicine Contact information: [address] [place] [address] [phone]                 Discharge Instructions:   Discharge Instructions     Activity as tolerated - No restrictions   Complete by: As directed    Call MD for:   Complete by: As directed    Please get in touch with hospice MD/nurse for any symptom control.   Diet general   Complete by: As directed    If patient is alert and awake enough to eat, can allow luxury feeding.   Discharge instructions   Complete by: As directed    Medicines intended for comfort and pain management prescribed. Rest of the care per hospice policy.       Discharge Exam:   Vitals:   04/16/24 0336 04/16/24 0451 04/16/24 0624 04/16/24 0924  BP: (!) 104/52   (!) 104/42  Pulse: 78 81 70 70  Resp:  15 16 13 16   Temp: 97.6 F (36.4 C)   98.7 F (37.1 C)  TempSrc: Axillary   Oral  SpO2: 100% 100% 100% 100%  Weight:  29.9 kg    Height:        Body mass index is 13.78 kg/m.   General exam: Pleasant, elderly Caucasian female.  Not in distress.  Breathing on 4 L oxygen . I did not do a detailed examination because of comfort care status   The results of significant diagnostics from this hospitalization (including imaging, microbiology, ancillary and laboratory) are listed below for reference.    Procedures and Diagnostic Studies:   ECHOCARDIOGRAM COMPLETE Result Date: 04/15/2024    ECHOCARDIOGRAM REPORT   Patient Name:   Sara Cochran [facility] Date of Exam: 04/15/2024 Medical Rec #:  [id number]  Height:       58.0 in Accession #:    [id number]    Weight:       68.8 lb Date of Birth:  23-Jul-1933     BSA:          1.157 m Patient Age:    89 years      BP:           113/53 mmHg Patient Gender: F             HR:           94 bpm. Exam Location:  [facility] Procedure: 2D Echo, Cardiac Doppler and Color Doppler (Both Spectral and Color            Flow Doppler were utilized during procedure). Indications:    CHF-Acute Systolic I50.21  History:        Patient has no prior history of Echocardiogram examinations.                 COPD.  Sonographer:    Jayson Gaskins Referring Phys: [id number] PRINCE T DJAN IMPRESSIONS  1. Left ventricular ejection fraction, by estimation, is 70 to 75%. The left ventricle has hyperdynamic function. The left ventricle has no regional wall motion abnormalities. There is moderate asymmetric left ventricular hypertrophy of the basal-septal  segment. Left ventricular diastolic parameters are consistent with Grade I diastolic dysfunction (impaired relaxation).  2. Right ventricular systolic function is normal. The right ventricular size is mildly enlarged. There is normal pulmonary artery systolic pressure.  3. Large presumably lung mass noted with some some compression of the left atrium.   4. The mitral valve is abnormal. Trivial mitral valve regurgitation. Moderate mitral annular calcification.  5. The aortic valve is calcified. There is moderate calcification of the aortic valve. There is moderate thickening of the aortic valve. Aortic valve regurgitation is not visualized. Aortic valve sclerosis/calcification is present, without any evidence of aortic stenosis.  6. IVC is small suggesting low RA pressure and hypovolemia. FINDINGS  Left Ventricle: Left ventricular ejection fraction, by estimation, is 70 to 75%. The left ventricle has hyperdynamic function. The left ventricle has no regional wall motion abnormalities. The left ventricular internal cavity size was small. There is moderate asymmetric left ventricular hypertrophy of the basal-septal segment. Left ventricular diastolic parameters are consistent with Grade I diastolic dysfunction (impaired relaxation). Normal left ventricular filling pressure. Right Ventricle: The right ventricular size is mildly enlarged. No increase in right ventricular wall thickness. Right ventricular systolic function is normal. There is normal pulmonary artery systolic pressure. The tricuspid regurgitant velocity is 2.79  m/s, and with an assumed right atrial pressure of 3 mmHg, the estimated right ventricular systolic pressure is 34.1 mmHg. Left Atrium: Large presumably lung mass noted with some some compression of the left atrium. Left atrial size was not well visualized. Right Atrium: Right atrial size was normal in size. Pericardium: Trivial pericardial effusion is present. The pericardial effusion is circumferential. Mitral Valve: The mitral valve is abnormal. There is moderate thickening of the mitral valve leaflet(s). There is moderate calcification of the mitral valve leaflet(s). Moderate mitral annular calcification. Trivial mitral valve regurgitation. Tricuspid Valve: The tricuspid valve is grossly normal. Tricuspid valve regurgitation is mild. Aortic  Valve: The aortic valve is calcified. There is moderate calcification of the aortic valve. There is moderate thickening of the aortic valve. There is moderate aortic valve annular calcification. Aortic valve regurgitation is not visualized. Aortic  valve sclerosis/calcification is present, without  any evidence of aortic stenosis. Aortic valve mean gradient measures 2.0 mmHg. Aortic valve peak gradient measures 3.7 mmHg. Aortic valve area, by VTI measures 3.90 cm. Pulmonic Valve: The pulmonic valve was not well visualized. Pulmonic valve regurgitation is trivial. No evidence of pulmonic stenosis. Aorta: The aortic root is normal in size and structure and the ascending aorta was not well visualized. Venous: IVC is small suggesting low RA pressure and hypovolemia. IAS/Shunts: No atrial level shunt detected by color flow Doppler.  LEFT VENTRICLE PLAX 2D LVIDd:         2.50 cm   Diastology LVIDs:         1.50 cm   LV e' medial:    5.77 cm/s LV PW:         1.00 cm   LV E/e' medial:  14.9 LV IVS:        1.40 cm   LV e' lateral:   7.72 cm/s LVOT diam:     1.90 cm   LV E/e' lateral: 11.2 LV SV:         58 LV SV Index:   50 LVOT Area:     2.84 cm  RIGHT VENTRICLE TAPSE (M-mode): 1.9 cm LEFT ATRIUM           Index        RIGHT ATRIUM          Index LA Vol (A2C): 18.5 ml 15.99 ml/m  RA Area:     7.57 cm LA Vol (A4C): 31.9 ml 27.57 ml/m  RA Volume:   14.50 ml 12.53 ml/m  AORTIC VALVE AV Area (Vmax):    3.49 cm AV Area (Vmean):   3.67 cm AV Area (VTI):     3.90 cm AV Vmax:           96.80 cm/s AV Vmean:          62.700 cm/s AV VTI:            0.149 m AV Peak Grad:      3.7 mmHg AV Mean Grad:      2.0 mmHg LVOT Vmax:         119.00 cm/s LVOT Vmean:        81.200 cm/s LVOT VTI:          0.205 m LVOT/AV VTI ratio: 1.38  AORTA Ao Root diam: 3.30 cm MITRAL VALVE                TRICUSPID VALVE MV Area (PHT): 4.06 cm     TR Peak grad:   31.1 mmHg MV Decel Time: 187 msec     TR Vmax:        279.00 cm/s MV E velocity: 86.20 cm/s  MV A velocity: 136.00 cm/s  SHUNTS MV E/A ratio:  0.63         Systemic VTI:  0.20 m                             Systemic Diam: 1.90 cm Dorn Ross MD Electronically signed by Dorn Ross MD Signature Date/Time: 04/15/2024/11:10:04 AM    Final    CT Chest Wo Contrast Result Date: 04/14/2024 EXAM: CT CHEST WITHOUT CONTRAST 04/14/2024 06:41:47 PM TECHNIQUE: CT of the chest was performed without the administration of intravenous contrast. Multiplanar reformatted images are provided for review. Automated exposure control, iterative reconstruction, and/or weight based adjustment of the mA/kV was utilized to reduce the radiation  dose to as low as reasonably achievable. COMPARISON: Chest radiograph 04/14/2024, CT chest 07/24/2023, and PET CT 08/08/2023. CLINICAL HISTORY: Abnormal x-ray with lung opacity and lung mass. Swelling to the feet and left lower leg. FINDINGS: MEDIASTINUM: Evaluation of mediastinal structures is limited due to lack of IV contrast material and patient cachexia. The heart size is normal. No pericardial effusions. Normal caliber thoracic aorta with diffuse calcifications. The esophagus is dilated with air-fluid level. This could represent distal esophageal mass, stricture, achalasia, or dysmotility. Esophageal dilatation is increased since the prior study. Probable hiatal hernia. The central airways are clear. LYMPH NODES: No definite lymphadenopathy. LUNGS AND PLEURA: Large left anterior lingular mass lesion measuring 5.8 x 6.5 cm. The mass demonstrates marked enlargement since the prior comparison studies. This is likely primary carcinoma. Severe diffuse emphysematous changes throughout the lungs. Linear scarring in the lung bases and right midlung scarring in the apices bilaterally. Small left pleural effusion. No pneumothorax. SOFT TISSUES/BONES: Multiple vertebral compression deformities in the mid and lower thoracic spine. Similar appearance to the previous study. This is likely  osteoporosis. No focal bone lesions are identified to suggest metastatic disease. No acute abnormality of the soft tissues. UPPER ABDOMEN: Limited images of the upper abdomen demonstrates no acute abnormality. IMPRESSION: 1. Large left anterior lingular mass lesion measuring 5.8 x 6.5 cm, likely primary carcinoma, with marked enlargement since prior studies. 2. Small left pleural effusion. 3. Dilated esophagus with air-fluid level, increased since prior study, possibly representing distal esophageal mass, stricture, achalasia, or dysmotility. Electronically signed by: Elsie Gravely MD 04/14/2024 07:17 PM EST RP Workstation: [id number]   DG Chest Portable 1 View Result Date: 04/14/2024 CLINICAL DATA:  Shortness of breath EXAM: PORTABLE CHEST 1 VIEW COMPARISON:  07/24/2023, PET CT 08/08/2023 FINDINGS: Hyperinflation with emphysema. Vague oval opacity measuring about 7.9 cm in the left mid to lower lung. Hiatal hernia. Aortic atherosclerosis. IMPRESSION: Hyperinflation with emphysema. Vague oval opacity in the left mid to lower lung, suspect that this represents interval enlargement of previously demonstrated lung mass. Recommend chest CT follow-up for further assessment Electronically Signed   By: Luke Bun M.D.   On: 04/14/2024 17:48   US  Venous Img Lower Bilateral Result Date: 04/14/2024 CLINICAL DATA:  Short of breath lower extremity swelling EXAM: Bilateral LOWER EXTREMITY VENOUS DOPPLER ULTRASOUND TECHNIQUE: Gray-scale sonography with compression, as well as color and duplex ultrasound, were performed to evaluate the deep venous system(s) from the level of the common femoral vein through the popliteal and proximal calf veins. COMPARISON:  None Available. FINDINGS: VENOUS Normal compressibility of the common femoral, superficial femoral, and popliteal veins, as well as the visualized calf veins. Visualized portions of profunda femoral vein and great saphenous vein unremarkable. No filling defects to  suggest DVT on grayscale or color Doppler imaging. Doppler waveforms show normal direction of venous flow, normal respiratory plasticity and response to augmentation. OTHER None. Limitations: none IMPRESSION: Negative. Electronically Signed   By: Luke Bun M.D.   On: 04/14/2024 17:45     Labs:   Basic Metabolic Panel: Recent Labs  Lab 04/14/24 1252 04/15/24 0417  NA 145 148*  K 4.5 4.5  CL 96* 97*  CO2 37* 38*  GLUCOSE 86 84  BUN 28* 32*  CREATININE 1.41* 1.66*  CALCIUM  8.8* 8.6*   GFR Estimated Creatinine Clearance: 11.1 mL/min (A) (by C-G formula based on SCr of 1.66 mg/dL (H)). Liver Function Tests: Recent Labs  Lab 04/15/24 0417  AST 28  ALT 9  ALKPHOS  102  BILITOT 0.5  PROT 6.0*  ALBUMIN 3.8   No results for input(s): LIPASE, AMYLASE in the last 168 hours. No results for input(s): AMMONIA in the last 168 hours. Coagulation profile No results for input(s): INR, PROTIME in the last 168 hours.  CBC: Recent Labs  Lab 04/14/24 1252 04/15/24 0417  WBC 7.4 11.7*  NEUTROABS 6.1  --   HGB 9.0* 8.9*  HCT 31.0* 30.3*  MCV 95.1 95.0  PLT 267 257   Cardiac Enzymes: No results for input(s): CKTOTAL, CKMB, CKMBINDEX, TROPONINI in the last 168 hours. BNP: Invalid input(s): POCBNP CBG: No results for input(s): GLUCAP in the last 168 hours. D-Dimer No results for input(s): DDIMER in the last 72 hours. Hgb A1c No results for input(s): HGBA1C in the last 72 hours. Lipid Profile No results for input(s): CHOL, HDL, LDLCALC, TRIG, CHOLHDL, LDLDIRECT in the last 72 hours. Thyroid  function studies No results for input(s): TSH, T4TOTAL, T3FREE, THYROIDAB in the last 72 hours.  Invalid input(s): FREET3 Anemia work up No results for input(s): VITAMINB12, FOLATE, FERRITIN, TIBC, IRON, RETICCTPCT in the last 72 hours. Microbiology No results found for this or any previous visit (from the past 240  hours).  Time coordinating discharge: 45 minutes  Signed: Marycarmen Hagey  [facility] 04/16/2024, 10:21 AM   "

## 2024-04-16 NOTE — Care Management Important Message (Signed)
 Important Message  Patient Details  Name: Sara Cochran MRN: [id number] Date of Birth: 1933-06-18   Important Message Given:  N/A - LOS <3 / Initial given by admissions     Duwaine LITTIE Ada 04/16/2024, 10:27 AM

## 2024-04-16 NOTE — TOC Transition Note (Signed)
 Transition of Care [facility]) - Discharge Note   Patient Details  Name: Sara Cochran MRN: [id number] Date of Birth: 1933/07/18  [facility] of [facility] CM/SW Contact:  Hoy DELENA Bigness, LCSW Phone Number: 04/16/2024, 10:19 AM   Clinical Narrative:    Pt to discharge home with hospice services through [facility]. Hospital bed to be delivered to home by [facility] Dorina reinhold [place] informed of discharge today. Son to provide transportation home for pt.    Final next level of care: Home w Hospice Care Barriers to Discharge: Barriers Resolved   Patient Goals and CMS Choice Patient states their goals for this hospitalization and ongoing recovery are:: For pt to return home with hospice CMS Medicare.gov Compare Post Acute Care list provided to:: Patient Represenative (must comment) Choice offered to / list presented to : Adult Children [place] ownership interest in [facility].provided to::  (NA)    Discharge Placement                Patient to be transferred to facility by: Son      Discharge Plan and Services Additional resources added to the After Visit Summary for   In-house Referral: Clinical Social Work, Hospice / Palliative Care Discharge Planning Services: NA Post Acute Care Choice: Hospice          DME Arranged: Hospital bed DME Agency: [facility]                  Social Drivers of Health (SDOH) Interventions SDOH Screenings   Food Insecurity: No Food Insecurity (04/14/2024)  Housing: Low Risk (04/14/2024)  Transportation Needs: No Transportation Needs (04/15/2024)  Utilities: Not At Risk (04/14/2024)  Social Connections: Socially Isolated (04/14/2024)  Tobacco Use: Medium Risk (04/14/2024)     Readmission Risk Interventions    04/16/2024   10:17 AM  Readmission Risk Prevention Plan  Transportation Screening Complete  PCP or Specialist Appt within 5-7 Days Complete  Home Care Screening Complete  Medication Review (RN CM)  Complete

## 2024-04-16 NOTE — Progress Notes (Signed)
 At 1155: Patient transported home with son.

## 2024-04-16 NOTE — Progress Notes (Signed)
 Heart Failure Navigator Progress Note  Assessed for Heart & Vascular TOC clinic readiness.  Patient does not meet criteria due to current Comfort Care status and will be discharged home with Hospice.   Navigator will sign off at this time.  Charmaine Pines, RN, BSN [facility] Heart Failure Navigator Secure Chat Only
